# Patient Record
Sex: Female | Born: 1960 | Race: White | Hispanic: No | Marital: Married | Smoking: Former smoker
Health system: Southern US, Community
[De-identification: ages and names within clinical notes are randomized; demographics above are authoritative.]

## PROBLEM LIST (undated history)

## (undated) DIAGNOSIS — Z860101 Personal history of adenomatous and serrated colon polyps: Secondary | ICD-10-CM

## (undated) DIAGNOSIS — F32A Depression, unspecified: Secondary | ICD-10-CM

## (undated) DIAGNOSIS — J302 Other seasonal allergic rhinitis: Secondary | ICD-10-CM

## (undated) DIAGNOSIS — Z803 Family history of malignant neoplasm of breast: Secondary | ICD-10-CM

## (undated) DIAGNOSIS — J189 Pneumonia, unspecified organism: Secondary | ICD-10-CM

## (undated) DIAGNOSIS — G971 Other reaction to spinal and lumbar puncture: Secondary | ICD-10-CM

## (undated) DIAGNOSIS — Z9889 Other specified postprocedural states: Secondary | ICD-10-CM

## (undated) DIAGNOSIS — R112 Nausea with vomiting, unspecified: Secondary | ICD-10-CM

## (undated) DIAGNOSIS — I10 Essential (primary) hypertension: Secondary | ICD-10-CM

## (undated) DIAGNOSIS — R5382 Chronic fatigue, unspecified: Secondary | ICD-10-CM

## (undated) DIAGNOSIS — Z9289 Personal history of other medical treatment: Secondary | ICD-10-CM

## (undated) DIAGNOSIS — Z800 Family history of malignant neoplasm of digestive organs: Secondary | ICD-10-CM

## (undated) DIAGNOSIS — F319 Bipolar disorder, unspecified: Secondary | ICD-10-CM

## (undated) DIAGNOSIS — F411 Generalized anxiety disorder: Secondary | ICD-10-CM

## (undated) DIAGNOSIS — D126 Benign neoplasm of colon, unspecified: Secondary | ICD-10-CM

## (undated) DIAGNOSIS — F419 Anxiety disorder, unspecified: Secondary | ICD-10-CM

## (undated) DIAGNOSIS — F329 Major depressive disorder, single episode, unspecified: Secondary | ICD-10-CM

## (undated) DIAGNOSIS — K219 Gastro-esophageal reflux disease without esophagitis: Secondary | ICD-10-CM

## (undated) DIAGNOSIS — M1990 Unspecified osteoarthritis, unspecified site: Secondary | ICD-10-CM

## (undated) HISTORY — DX: Essential (primary) hypertension: I10

## (undated) HISTORY — DX: Major depressive disorder, single episode, unspecified: F32.9

## (undated) HISTORY — DX: Depression, unspecified: F32.A

## (undated) HISTORY — DX: Benign neoplasm of colon, unspecified: D12.6

## (undated) HISTORY — PX: HYSTERECTOMY ABDOMINAL WITH SALPINGECTOMY: SHX6725

## (undated) HISTORY — PX: HEMORRHOID BANDING: SHX5850

## (undated) HISTORY — DX: Chronic fatigue, unspecified: R53.82

## (undated) HISTORY — DX: Personal history of adenomatous and serrated colon polyps: Z86.0101

## (undated) HISTORY — DX: Family history of malignant neoplasm of digestive organs: Z80.0

## (undated) HISTORY — DX: Other seasonal allergic rhinitis: J30.2

## (undated) HISTORY — DX: Anxiety disorder, unspecified: F41.9

## (undated) HISTORY — DX: Gastro-esophageal reflux disease without esophagitis: K21.9

## (undated) HISTORY — DX: Generalized anxiety disorder: F41.1

## (undated) HISTORY — DX: Personal history of other medical treatment: Z92.89

## (undated) HISTORY — DX: Family history of malignant neoplasm of breast: Z80.3

---

## 1986-12-26 HISTORY — PX: APPENDECTOMY: SHX54

## 1994-12-26 DIAGNOSIS — D126 Benign neoplasm of colon, unspecified: Secondary | ICD-10-CM

## 1994-12-26 HISTORY — DX: Benign neoplasm of colon, unspecified: D12.6

## 1998-05-22 ENCOUNTER — Other Ambulatory Visit: Admission: RE | Admit: 1998-05-22 | Discharge: 1998-05-22 | Payer: Self-pay | Admitting: Gynecology

## 1999-07-26 ENCOUNTER — Other Ambulatory Visit: Admission: RE | Admit: 1999-07-26 | Discharge: 1999-07-26 | Payer: Self-pay | Admitting: Obstetrics & Gynecology

## 2000-01-20 ENCOUNTER — Inpatient Hospital Stay (HOSPITAL_COMMUNITY): Admission: AD | Admit: 2000-01-20 | Discharge: 2000-01-23 | Payer: Self-pay | Admitting: Obstetrics and Gynecology

## 2000-01-24 ENCOUNTER — Encounter: Admission: RE | Admit: 2000-01-24 | Discharge: 2000-04-23 | Payer: Self-pay | Admitting: Obstetrics and Gynecology

## 2000-04-25 ENCOUNTER — Encounter: Admission: RE | Admit: 2000-04-25 | Discharge: 2000-07-24 | Payer: Self-pay | Admitting: Obstetrics and Gynecology

## 2000-07-26 ENCOUNTER — Encounter: Admission: RE | Admit: 2000-07-26 | Discharge: 2000-08-23 | Payer: Self-pay | Admitting: Obstetrics and Gynecology

## 2000-08-18 ENCOUNTER — Other Ambulatory Visit: Admission: RE | Admit: 2000-08-18 | Discharge: 2000-08-18 | Payer: Self-pay | Admitting: Obstetrics & Gynecology

## 2001-02-22 ENCOUNTER — Other Ambulatory Visit: Admission: RE | Admit: 2001-02-22 | Discharge: 2001-02-22 | Payer: Self-pay | Admitting: Obstetrics & Gynecology

## 2001-02-22 ENCOUNTER — Encounter (INDEPENDENT_AMBULATORY_CARE_PROVIDER_SITE_OTHER): Payer: Self-pay

## 2001-12-26 DIAGNOSIS — Z9289 Personal history of other medical treatment: Secondary | ICD-10-CM

## 2001-12-26 HISTORY — DX: Personal history of other medical treatment: Z92.89

## 2002-01-01 ENCOUNTER — Other Ambulatory Visit: Admission: RE | Admit: 2002-01-01 | Discharge: 2002-01-01 | Payer: Self-pay | Admitting: Obstetrics & Gynecology

## 2002-10-01 ENCOUNTER — Observation Stay (HOSPITAL_COMMUNITY): Admission: RE | Admit: 2002-10-01 | Discharge: 2002-10-02 | Payer: Self-pay | Admitting: Obstetrics & Gynecology

## 2002-12-26 HISTORY — PX: ABDOMINAL HYSTERECTOMY: SHX81

## 2003-02-04 ENCOUNTER — Encounter: Payer: Self-pay | Admitting: Internal Medicine

## 2003-02-04 ENCOUNTER — Encounter: Admission: RE | Admit: 2003-02-04 | Discharge: 2003-02-04 | Payer: Self-pay | Admitting: Internal Medicine

## 2003-02-27 ENCOUNTER — Other Ambulatory Visit: Admission: RE | Admit: 2003-02-27 | Discharge: 2003-02-27 | Payer: Self-pay | Admitting: Gynecology

## 2003-03-20 ENCOUNTER — Encounter (INDEPENDENT_AMBULATORY_CARE_PROVIDER_SITE_OTHER): Payer: Self-pay | Admitting: Specialist

## 2003-03-20 ENCOUNTER — Inpatient Hospital Stay (HOSPITAL_COMMUNITY): Admission: RE | Admit: 2003-03-20 | Discharge: 2003-03-21 | Payer: Self-pay | Admitting: Gynecology

## 2003-11-11 ENCOUNTER — Encounter: Admission: RE | Admit: 2003-11-11 | Discharge: 2003-11-11 | Payer: Self-pay | Admitting: Endocrinology

## 2004-09-12 ENCOUNTER — Emergency Department (HOSPITAL_COMMUNITY): Admission: EM | Admit: 2004-09-12 | Discharge: 2004-09-13 | Payer: Self-pay | Admitting: Emergency Medicine

## 2005-01-06 ENCOUNTER — Other Ambulatory Visit: Admission: RE | Admit: 2005-01-06 | Discharge: 2005-01-06 | Payer: Self-pay | Admitting: Gynecology

## 2005-04-20 ENCOUNTER — Ambulatory Visit (HOSPITAL_BASED_OUTPATIENT_CLINIC_OR_DEPARTMENT_OTHER): Admission: RE | Admit: 2005-04-20 | Discharge: 2005-04-20 | Payer: Self-pay | Admitting: Urology

## 2005-04-20 ENCOUNTER — Ambulatory Visit (HOSPITAL_COMMUNITY): Admission: RE | Admit: 2005-04-20 | Discharge: 2005-04-20 | Payer: Self-pay | Admitting: Urology

## 2005-08-03 ENCOUNTER — Ambulatory Visit: Payer: Self-pay | Admitting: Internal Medicine

## 2005-08-05 ENCOUNTER — Ambulatory Visit: Payer: Self-pay | Admitting: Internal Medicine

## 2005-11-11 ENCOUNTER — Ambulatory Visit: Payer: Self-pay | Admitting: Internal Medicine

## 2005-11-15 ENCOUNTER — Ambulatory Visit: Payer: Self-pay | Admitting: Gastroenterology

## 2005-11-29 ENCOUNTER — Ambulatory Visit: Payer: Self-pay | Admitting: Gastroenterology

## 2006-03-07 ENCOUNTER — Ambulatory Visit: Payer: Self-pay | Admitting: Internal Medicine

## 2006-05-23 ENCOUNTER — Ambulatory Visit: Payer: Self-pay | Admitting: Internal Medicine

## 2006-07-06 ENCOUNTER — Ambulatory Visit: Payer: Self-pay | Admitting: Internal Medicine

## 2006-08-19 ENCOUNTER — Observation Stay (HOSPITAL_COMMUNITY): Admission: EM | Admit: 2006-08-19 | Discharge: 2006-08-20 | Payer: Self-pay | Admitting: Emergency Medicine

## 2006-09-08 ENCOUNTER — Ambulatory Visit: Payer: Self-pay | Admitting: Internal Medicine

## 2006-11-02 ENCOUNTER — Ambulatory Visit: Payer: Self-pay | Admitting: Internal Medicine

## 2006-12-26 HISTORY — PX: BREAST ENHANCEMENT SURGERY: SHX7

## 2006-12-26 HISTORY — PX: LUMBAR LAMINECTOMY: SHX95

## 2007-02-06 ENCOUNTER — Other Ambulatory Visit: Admission: RE | Admit: 2007-02-06 | Discharge: 2007-02-06 | Payer: Self-pay | Admitting: Gynecology

## 2007-02-21 ENCOUNTER — Ambulatory Visit: Payer: Self-pay | Admitting: Internal Medicine

## 2007-02-27 ENCOUNTER — Ambulatory Visit: Payer: Self-pay | Admitting: Internal Medicine

## 2007-09-11 ENCOUNTER — Ambulatory Visit: Payer: Self-pay | Admitting: Internal Medicine

## 2007-09-11 DIAGNOSIS — R5381 Other malaise: Secondary | ICD-10-CM

## 2007-09-11 DIAGNOSIS — R0609 Other forms of dyspnea: Secondary | ICD-10-CM

## 2007-09-11 DIAGNOSIS — R5383 Other fatigue: Secondary | ICD-10-CM

## 2007-09-11 DIAGNOSIS — M545 Low back pain: Secondary | ICD-10-CM

## 2007-09-11 DIAGNOSIS — R0989 Other specified symptoms and signs involving the circulatory and respiratory systems: Secondary | ICD-10-CM | POA: Insufficient documentation

## 2007-09-12 LAB — CONVERTED CEMR LAB
ALT: 17 units/L (ref 0–35)
AST: 19 units/L (ref 0–37)
Albumin: 4.1 g/dL (ref 3.5–5.2)
BUN: 11 mg/dL (ref 6–23)
Bilirubin, Direct: 0.1 mg/dL (ref 0.0–0.3)
CO2: 35 meq/L — ABNORMAL HIGH (ref 19–32)
Creatinine, Ser: 0.8 mg/dL (ref 0.4–1.2)
Free T4: 0.8 ng/dL (ref 0.6–1.6)
GFR calc non Af Amer: 83 mL/min
Monocytes Absolute: 0.6 10*3/uL (ref 0.2–0.7)
Neutro Abs: 3.9 10*3/uL (ref 1.4–7.7)
Neutrophils Relative %: 59 % (ref 43.0–77.0)
Platelets: 234 10*3/uL (ref 150–400)
Sodium: 140 meq/L (ref 135–145)
TSH: 1.59 microintl units/mL (ref 0.35–5.50)
Total Protein: 7 g/dL (ref 6.0–8.3)

## 2007-09-13 ENCOUNTER — Encounter (INDEPENDENT_AMBULATORY_CARE_PROVIDER_SITE_OTHER): Payer: Self-pay | Admitting: *Deleted

## 2007-09-24 ENCOUNTER — Ambulatory Visit: Payer: Self-pay | Admitting: Internal Medicine

## 2007-09-25 ENCOUNTER — Encounter (INDEPENDENT_AMBULATORY_CARE_PROVIDER_SITE_OTHER): Payer: Self-pay | Admitting: *Deleted

## 2007-09-28 ENCOUNTER — Encounter (INDEPENDENT_AMBULATORY_CARE_PROVIDER_SITE_OTHER): Payer: Self-pay | Admitting: *Deleted

## 2007-10-31 ENCOUNTER — Ambulatory Visit (HOSPITAL_COMMUNITY): Admission: RE | Admit: 2007-10-31 | Discharge: 2007-11-01 | Payer: Self-pay | Admitting: Specialist

## 2007-11-09 ENCOUNTER — Encounter (INDEPENDENT_AMBULATORY_CARE_PROVIDER_SITE_OTHER): Payer: Self-pay | Admitting: Dermatology

## 2007-12-07 ENCOUNTER — Encounter: Payer: Self-pay | Admitting: Internal Medicine

## 2007-12-27 HISTORY — PX: TONSILLECTOMY: SUR1361

## 2007-12-28 ENCOUNTER — Emergency Department (HOSPITAL_COMMUNITY): Admission: EM | Admit: 2007-12-28 | Discharge: 2007-12-29 | Payer: Self-pay | Admitting: Emergency Medicine

## 2008-07-15 DIAGNOSIS — F411 Generalized anxiety disorder: Secondary | ICD-10-CM

## 2008-07-15 HISTORY — DX: Generalized anxiety disorder: F41.1

## 2008-07-22 ENCOUNTER — Ambulatory Visit: Payer: Self-pay | Admitting: Internal Medicine

## 2008-07-22 ENCOUNTER — Encounter (INDEPENDENT_AMBULATORY_CARE_PROVIDER_SITE_OTHER): Payer: Self-pay | Admitting: *Deleted

## 2008-07-22 DIAGNOSIS — G43709 Chronic migraine without aura, not intractable, without status migrainosus: Secondary | ICD-10-CM | POA: Insufficient documentation

## 2008-07-22 DIAGNOSIS — D126 Benign neoplasm of colon, unspecified: Secondary | ICD-10-CM

## 2008-07-22 DIAGNOSIS — E049 Nontoxic goiter, unspecified: Secondary | ICD-10-CM | POA: Insufficient documentation

## 2008-07-28 ENCOUNTER — Encounter (INDEPENDENT_AMBULATORY_CARE_PROVIDER_SITE_OTHER): Payer: Self-pay | Admitting: *Deleted

## 2008-09-08 ENCOUNTER — Other Ambulatory Visit: Admission: RE | Admit: 2008-09-08 | Discharge: 2008-09-08 | Payer: Self-pay | Admitting: Gynecology

## 2008-09-25 ENCOUNTER — Emergency Department (HOSPITAL_COMMUNITY): Admission: EM | Admit: 2008-09-25 | Discharge: 2008-09-26 | Payer: Self-pay | Admitting: Emergency Medicine

## 2009-01-19 ENCOUNTER — Encounter: Payer: Self-pay | Admitting: Internal Medicine

## 2009-02-10 ENCOUNTER — Encounter (INDEPENDENT_AMBULATORY_CARE_PROVIDER_SITE_OTHER): Payer: Self-pay | Admitting: *Deleted

## 2009-04-01 ENCOUNTER — Encounter: Payer: Self-pay | Admitting: Internal Medicine

## 2009-04-24 ENCOUNTER — Encounter: Payer: Self-pay | Admitting: Internal Medicine

## 2009-09-17 ENCOUNTER — Telehealth (INDEPENDENT_AMBULATORY_CARE_PROVIDER_SITE_OTHER): Payer: Self-pay | Admitting: *Deleted

## 2009-09-22 ENCOUNTER — Ambulatory Visit: Payer: Self-pay | Admitting: Internal Medicine

## 2009-11-27 ENCOUNTER — Encounter (INDEPENDENT_AMBULATORY_CARE_PROVIDER_SITE_OTHER): Payer: Self-pay | Admitting: *Deleted

## 2010-02-25 ENCOUNTER — Telehealth (INDEPENDENT_AMBULATORY_CARE_PROVIDER_SITE_OTHER): Payer: Self-pay | Admitting: *Deleted

## 2010-03-01 ENCOUNTER — Encounter (INDEPENDENT_AMBULATORY_CARE_PROVIDER_SITE_OTHER): Payer: Self-pay | Admitting: *Deleted

## 2010-06-07 ENCOUNTER — Ambulatory Visit: Payer: Self-pay | Admitting: Internal Medicine

## 2010-06-07 DIAGNOSIS — R635 Abnormal weight gain: Secondary | ICD-10-CM

## 2010-06-08 ENCOUNTER — Telehealth (INDEPENDENT_AMBULATORY_CARE_PROVIDER_SITE_OTHER): Payer: Self-pay | Admitting: *Deleted

## 2010-06-11 LAB — CONVERTED CEMR LAB
ALT: 18 units/L (ref 0–35)
Albumin: 4.6 g/dL (ref 3.5–5.2)
Alkaline Phosphatase: 54 units/L (ref 39–117)
Basophils Relative: 0.7 % (ref 0.0–3.0)
CO2: 32 meq/L (ref 19–32)
Chloride: 102 meq/L (ref 96–112)
Creatinine, Ser: 1 mg/dL (ref 0.4–1.2)
Eosinophils Absolute: 0.1 10*3/uL (ref 0.0–0.7)
Eosinophils Relative: 1.8 % (ref 0.0–5.0)
Glucose, Bld: 72 mg/dL (ref 70–99)
Lymphs Abs: 1.6 10*3/uL (ref 0.7–4.0)
Neutro Abs: 1 10*3/uL — ABNORMAL LOW (ref 1.4–7.7)
Neutrophils Relative %: 33.3 % — ABNORMAL LOW (ref 43.0–77.0)
Platelets: 271 10*3/uL (ref 150.0–400.0)
Potassium: 4.4 meq/L (ref 3.5–5.1)
TSH: 0.92 microintl units/mL (ref 0.35–5.50)
Total Protein: 7.2 g/dL (ref 6.0–8.3)

## 2010-09-01 ENCOUNTER — Ambulatory Visit: Payer: Self-pay | Admitting: Internal Medicine

## 2010-09-01 DIAGNOSIS — I10 Essential (primary) hypertension: Secondary | ICD-10-CM

## 2010-09-01 DIAGNOSIS — J45909 Unspecified asthma, uncomplicated: Secondary | ICD-10-CM | POA: Insufficient documentation

## 2010-09-01 DIAGNOSIS — J309 Allergic rhinitis, unspecified: Secondary | ICD-10-CM | POA: Insufficient documentation

## 2010-12-07 ENCOUNTER — Encounter: Payer: Self-pay | Admitting: Gastroenterology

## 2011-01-12 ENCOUNTER — Encounter: Payer: Self-pay | Admitting: Internal Medicine

## 2011-01-12 ENCOUNTER — Ambulatory Visit
Admission: RE | Admit: 2011-01-12 | Discharge: 2011-01-12 | Payer: Self-pay | Source: Home / Self Care | Attending: Internal Medicine | Admitting: Internal Medicine

## 2011-01-12 ENCOUNTER — Other Ambulatory Visit: Payer: Self-pay | Admitting: Internal Medicine

## 2011-01-12 DIAGNOSIS — Z8679 Personal history of other diseases of the circulatory system: Secondary | ICD-10-CM | POA: Insufficient documentation

## 2011-01-12 DIAGNOSIS — Z9189 Other specified personal risk factors, not elsewhere classified: Secondary | ICD-10-CM | POA: Insufficient documentation

## 2011-01-13 LAB — CBC WITH DIFFERENTIAL/PLATELET
Basophils Absolute: 0 10*3/uL (ref 0.0–0.1)
Basophils Relative: 0.5 % (ref 0.0–3.0)
Eosinophils Absolute: 0.2 10*3/uL (ref 0.0–0.7)
Eosinophils Relative: 3.8 % (ref 0.0–5.0)
HCT: 41.5 % (ref 36.0–46.0)
Hemoglobin: 14.2 g/dL (ref 12.0–15.0)
Lymphocytes Relative: 52.8 % — ABNORMAL HIGH (ref 12.0–46.0)
Lymphs Abs: 2.2 10*3/uL (ref 0.7–4.0)
MCHC: 34.2 g/dL (ref 30.0–36.0)
MCV: 94.3 fl (ref 78.0–100.0)
Monocytes Absolute: 0.7 10*3/uL (ref 0.1–1.0)
Monocytes Relative: 15.9 % — ABNORMAL HIGH (ref 3.0–12.0)
Neutro Abs: 1.1 10*3/uL — ABNORMAL LOW (ref 1.4–7.7)
Neutrophils Relative %: 27 % — ABNORMAL LOW (ref 43.0–77.0)
Platelets: 237 10*3/uL (ref 150.0–400.0)
RBC: 4.4 Mil/uL (ref 3.87–5.11)
RDW: 12.6 % (ref 11.5–14.6)
WBC: 4.2 10*3/uL — ABNORMAL LOW (ref 4.5–10.5)

## 2011-01-13 LAB — T4, FREE: Free T4: 0.67 ng/dL (ref 0.60–1.60)

## 2011-01-13 LAB — T3, FREE: T3, Free: 1.7 pg/mL — ABNORMAL LOW (ref 2.3–4.2)

## 2011-01-13 LAB — MAGNESIUM: Magnesium: 1.9 mg/dL (ref 1.5–2.5)

## 2011-01-13 LAB — TSH: TSH: 1.3 u[IU]/mL (ref 0.35–5.50)

## 2011-01-17 ENCOUNTER — Telehealth (INDEPENDENT_AMBULATORY_CARE_PROVIDER_SITE_OTHER): Payer: Self-pay | Admitting: *Deleted

## 2011-01-17 DIAGNOSIS — D7289 Other specified disorders of white blood cells: Secondary | ICD-10-CM | POA: Insufficient documentation

## 2011-01-23 LAB — CONVERTED CEMR LAB
ALT: 10 units/L (ref 0–35)
AST: 14 units/L (ref 0–37)
Bilirubin, Direct: 0.1 mg/dL (ref 0.0–0.3)
Calcium: 9.4 mg/dL (ref 8.4–10.5)
Free T4: 0.8 ng/dL (ref 0.6–1.6)
GFR calc Af Amer: 87 mL/min
Glucose, Bld: 96 mg/dL (ref 70–99)
HDL: 76.1 mg/dL (ref >39.0)
Hemoglobin: 14.4 g/dL (ref 12.0–15.0)
MCHC: 34.7 g/dL (ref 30.0–36.0)
Monocytes Absolute: 0.5 10*3/uL (ref 0.1–1.0)
Monocytes Relative: 7.3 % (ref 3.0–12.0)
Potassium: 4.4 meq/L (ref 3.5–5.1)
RBC: 4.35 M/uL (ref 3.87–5.11)
RDW: 11.8 % (ref 11.5–14.6)
TSH: 0.98 microintl units/mL (ref 0.35–5.50)
Total Bilirubin: 0.7 mg/dL (ref 0.3–1.2)
Total Protein: 7.2 g/dL (ref 6.0–8.3)
VLDL: 10 mg/dL (ref 0–40)
WBC: 6.3 10*3/uL (ref 4.5–10.5)

## 2011-01-24 ENCOUNTER — Ambulatory Visit: Payer: Self-pay | Admitting: Oncology

## 2011-01-25 NOTE — Progress Notes (Signed)
Summary: Appointment Due  Phone Note Outgoing Call Call back at [facility] Phone [phone] Call back at Work Phone [phone]   Summary of Call: Enrique Sack please call this patient to schedule a CPX. Shonna Chock  February 25, 2010 4:00 PM     Additional Follow-up for Phone Call Additional follow up Details #2::    [facility]  mailed a letter Follow-up by: Barb Merino,  February 25, 2010 4:04 PM

## 2011-01-25 NOTE — Letter (Signed)
Summary: [facility]  [facility]   Imported By: Lanelle Bal 09/13/2010 11:00:03  _____________________________________________________________________  External Attachment:    Type:   Image     Comment:   External Document

## 2011-01-25 NOTE — Assessment & Plan Note (Signed)
Summary: 2 WEEK FOLLOWUP///SPH   Vital Signs:  Patient profile:   50 year old female Weight:      152 pounds Temp:     98.6 degrees F oral Pulse rate:   84 / minute Resp:     15 per minute BP sitting:   138 / 90  (left arm) Cuff size:   large  Vitals Entered By: Shonna Chock CMA (September 01, 2010 4:28 PM) CC: Follow-up visit: from last OV, patient also fatigue x several months and with cough, Cough   CC:  Follow-up visit: from last OV, patient also fatigue x several months and with cough, and Cough.  History of Present Illness: Cough      This is a 50 year old woman who presents with Cough as "tickle " in throat X several months.  The patient reports non-productive cough and  persistant malaise( see CBC & dif from June 2011 with viral pattern).She  denies pleuritic chest pain, shortness of breath but difficulty getting deep breath @ times, especially pre exercise. MDI helps "sometimes". She also  wheezing, exertional dyspnea, fever, and hemoptysis.  Associated symtpoms include  facial pressure  and chronic rhinitis w/o purulence.  The patient denies the following symptoms frontal headaches & acid reflux symptoms.  The cough is worse with lying down.  Ineffective prior treatments have included OTC cough medication and throat lozenges.  Risk factors include history of asthma  as a child  with recurrence last year.She is not on ACE-I.Her daughter & son  has EIB/asthma.  Current Medications (verified): 1)  Triamterene-Hctz 75-50 Mg Tabs (Triamterene-Hctz) .Marland Kitchen.. 1 By Mouth Once Daily 2)  Wellbutrin Xl 150 Mg Xr24h-Tab (Bupropion Hcl) .... Take 3 Tabs Once Daily 3)  Multivitamin 4)  Tramadol Hcl 50 Mg Tabs (Tramadol Hcl) .Marland Kitchen.. 1 Q 6 Hrs As Needed 5)  Lamictal 150 Mg Tabs (Lamotrigine) .... Take 2 Tab At Bedtime 6)  Metamucil .... As Directed 7)  Dulcolax Otc .... Take 1 Tab Two Times A Day 8)  Depakote Er 500 Mg Xr24h-Tab (Divalproex Sodium) .Marland Kitchen.. 1 By Mouth Once Daily  Allergies: 1)  !  Pcn 2)  ! Codeine 3)  ! Vicodin 4)  ! Morphine  Review of Systems Allergy:  Complains of itching eyes and sneezing; No angioedema.  Physical Exam  General:  well-nourished,in no acute distress; alert,appropriate and cooperative throughout examination Eyes:  No corneal or conjunctival inflammation noted. EOMI. Perrla. Ears:  External ear exam shows no significant lesions or deformities.  Otoscopic examination reveals clear canals, tympanic membranes are intact bilaterally without bulging, retraction, inflammation or discharge. Hearing is grossly normal bilaterally. Nose:  External nasal examination shows no deformity or inflammation. Nasal mucosa are pink and moist without lesions or exudates. Mouth:  Oral mucosa and oropharynx without lesions or exudates.  Teeth in good repair. Lungs:  Normal respiratory effort, chest expands symmetrically. Lungs are clear to auscultation, no crackles or wheezes. Heart:  Normal rate and regular rhythm. S1 and S2 normal without gallop, murmur, click, rub .S 4 Extremities:  No clubbing, cyanosis, edema. Cervical Nodes:  L anterior LN tender.   Axillary Nodes:  No palpable lymphadenopathy   Impression & Recommendations:  Problem # 1:  COUGH (ICD-786.2)  Problem # 2:  FATIGUE (ICD-780.79) Persistant with "viral " picture 05/2010  Problem # 3:  ASTHMA (ICD-493.90)  EIB component  Her updated medication list for this problem includes:    Singulair 10 Mg Tabs (Montelukast sodium) .Marland Kitchen... 1 once  daily    Dulera 100-5 Mcg/act Aero (Mometasone furo-formoterol fum) .Marland Kitchen... 1 -2 sprays two times a day as needed  Problem # 4:  RHINITIS (ICD-477.9)  Her updated medication list for this problem includes:    Nasonex 50 Mcg/act Susp (Mometasone furoate) .Marland Kitchen... 1 spray two times a day as directed  Problem # 5:  HYPERTENSION (ICD-401.9)  Her updated medication list for this problem includes:    Triamterene-hctz 75-50 Mg Tabs (Triamterene-hctz) .Marland Kitchen... 1 by mouth  once daily    Amlodipine Besylate 5 Mg Tabs (Amlodipine besylate) .Marland Kitchen... 1 once daily  Complete Medication List: 1)  Triamterene-hctz 75-50 Mg Tabs (Triamterene-hctz) .Marland Kitchen.. 1 by mouth once daily 2)  Wellbutrin Xl 150 Mg Xr24h-tab (Bupropion hcl) .... Take 3 tabs once daily 3)  Multivitamin  4)  Tramadol Hcl 50 Mg Tabs (Tramadol hcl) .Marland Kitchen.. 1 q 6 hrs as needed 5)  Lamictal 150 Mg Tabs (Lamotrigine) .... Take 2 tab at bedtime 6)  Metamucil  .... As directed 7)  Dulcolax Otc  .... Take 1 tab two times a day 8)  Depakote Er 500 Mg Xr24h-tab (Divalproex sodium) .Marland Kitchen.. 1 by mouth once daily 9)  Nasonex 50 Mcg/act Susp (Mometasone furoate) .Marland Kitchen.. 1 spray two times a day as directed 10)  Singulair 10 Mg Tabs (Montelukast sodium) .Marland Kitchen.. 1 once daily 11)  Dulera 100-5 Mcg/act Aero (Mometasone furo-formoterol fum) .Marland Kitchen.. 1 -2 sprays two times a day as needed 12)  Amlodipine Besylate 5 Mg Tabs (Amlodipine besylate) .Marland Kitchen.. 1 once daily  Patient Instructions: 1)  Neti pot once daily as needed for any nasal congestion. 2)  Sip room temperature  NON dairy fluid  as needed for cough. 3)  Check your Blood Pressure regularly.Your goal = AVERAGE < 135/85. Prescriptions: AMLODIPINE BESYLATE 5 MG TABS (AMLODIPINE BESYLATE) 1 once daily  #30 x 5   Entered and Authorized by:   Marga Melnick MD   Signed by:   Marga Melnick MD on 09/01/2010   Method used:   Print then Give to Patient   RxID:   [id number] DULERA 100-5 MCG/ACT AERO (MOMETASONE FURO-FORMOTEROL FUM) 1 -2 sprays two times a day as needed  #1 x 5   Entered and Authorized by:   Marga Melnick MD   Signed by:   Marga Melnick MD on 09/01/2010   Method used:   Print then Give to Patient   RxID:   [id number] SINGULAIR 10 MG TABS (MONTELUKAST SODIUM) 1 once daily  #30 x 5   Entered and Authorized by:   Marga Melnick MD   Signed by:   Marga Melnick MD on 09/01/2010   Method used:   Print then Give to Patient   RxID:   [id number] NASONEX 50  MCG/ACT SUSP (MOMETASONE FUROATE) 1 spray two times a day as directed  #1 x 0   Entered and Authorized by:   Marga Melnick MD   Signed by:   Marga Melnick MD on 09/01/2010   Method used:   Samples Given   RxID:   [id number]

## 2011-01-25 NOTE — Letter (Signed)
Summary: Primary Care Appointment Letter  [facility] [place]  [address] [place], [place] [address]   Phone: [phone]  Fax: [phone]    03/01/2010 MRN: [id number]  [facility] [address] [place], [place]  [address]  Dear Ms. Chervenak,   Your Primary Care Physician Marga Melnick MD has indicated that:    __x_____it is time to schedule an appointment. Please call and schedule a physical.    _______you missed your appointment on______ and need to call and          reschedule.    _______you need to have lab work done.    _______you need to schedule an appointment discuss lab or test results.    _______you need to call to reschedule your appointment that is                       scheduled on _________.     Please call our office as soon as possible. Our phone number is [phone]________. Please press option 1. Our office is open 8a-12noon and 1p-5p, Monday through Friday.     Thank you,     Primary Care Scheduler

## 2011-01-25 NOTE — Progress Notes (Signed)
Summary: Refill Request  Phone Note Refill Request   Refills Requested: Medication #1:  TRIAMTERENE-HCTZ 75-50 MG TABS TAKE ONE TABLET DAILY**OFFICE VISIT DUE ** patient was seen [id number] - dr hoper was to give her new rx - please fax to [facility]   Method Requested: Fax to Local Pharmacy Initial call taken by: Okey Regal [place],  June 08, 2010 12:09 PM Caller: Patient    New/Updated Medications: TRIAMTERENE-HCTZ 75-50 MG TABS (TRIAMTERENE-HCTZ) 1 by mouth once daily Prescriptions: TRIAMTERENE-HCTZ 75-50 MG TABS (TRIAMTERENE-HCTZ) 1 by mouth once daily  #30 x 5   Entered by:   Shonna Chock   Authorized by:   Marga Melnick MD   Signed by:   Shonna Chock on 06/08/2010   Method used:   Electronically to        [id number] (retail)       [address]       [place], [place]  [address]       Ph: [phone]       Fax: [phone]   RxID:   [id number]

## 2011-01-25 NOTE — Assessment & Plan Note (Signed)
Summary: sluggish//kn   Vital Signs:  Patient profile:   50 year old female Height:      64.5 inches Weight:      150.4 pounds BMI:     25.51 Temp:     98.1 degrees F oral Pulse rate:   84 / minute Resp:     15 per minute BP sitting:   116 / 70  (left arm) Cuff size:   large  Vitals Entered By: Shonna Chock (June 07, 2010 1:26 PM) CC: Sluggish x 6 months, Family HX: Thyroid concerns. Refill Triamterene-HTCZ, Fatigue Comments REVIEWED MED LIST, PATIENT AGREED DOSE AND INSTRUCTION CORRECT    CC:  Sluggish x 6 months, Family HX: Thyroid concerns. Refill Triamterene-HTCZ, and Fatigue.  History of Present Illness:  Fatigue      This is a 50 year old woman who presents with Fatigue, worse over 6 months.  The patient reports persistent fatigue, fatigue  even without physical activity and primarily physical fatigue, not motivational.  The patient also reports occasional night sweats and dyspnea (EIB).  The patient denies fever, weight loss, exertional chest pain, cough, and hemoptysis.  Other symptoms include daytime sleepiness and skin changes(excessive dryness).  The patient denies the following symptoms: leg swelling, orthopnea, PND, melena, adenopathy. Her husband describes  snoring w/o apnea , ? due to 10# gain since 08/2009.  Depressive symptoms include altered appetite.  The patient denies anhedonia, feeling depressed, and poor sleep.    Allergies: 1)  ! Pcn 2)  ! Codeine 3)  ! Vicodin 4)  ! Morphine  Review of Systems General:  Denies chills. Eyes:  Denies blurring, double vision, and vision loss-both eyes; Using bifocals. ENT:  Denies difficulty swallowing and hoarseness. CV:  Denies palpitations. Resp:  Complains of morning headaches. GI:  Denies constipation and diarrhea. MS:  Complains of low back pain; denies joint pain, joint redness, and joint swelling; S/P ESI by Dr Shelle Iron. Derm:  Denies changes in nail beds, hair loss, lesion(s), and rash; No tick exposure. Neuro:   Complains of numbness and tingling; N&T R shoulder . Psych:  Complains of irritability; denies anxiety, depression, easily angered, and easily tearful; Dr Tiajuana Amass has  Rxed Wellbutrin& Lamictil. She weaned herself off Depakote due to concerns about possible adverse effects.. Endo:  Complains of excessive thirst; denies cold intolerance, excessive hunger, excessive urination, and heat intolerance. Heme:  Denies abnormal bruising and bleeding.  Physical Exam  General:  Well-developed,well-nourished,in no acute distress; alert,appropriate and cooperative throughout examination Eyes:  No corneal or conjunctival inflammation noted.  Perrla. No lid lag. No icterus Neck:  No deformities, masses, or tenderness noted. Lungs:  Normal respiratory effort, chest expands symmetrically. Lungs are clear to auscultation, no crackles or wheezes. Heart:  normal rate, regular rhythm, no gallop, no rub, no JVD, no HJR, and grade 1/2-1  /6 systolic murmur LSB.   Abdomen:  Bowel sounds positive,abdomen soft and non-tender without masses, organomegaly or hernias noted. Pulses:  R and L carotid,radial,dorsalis pedis and posterior tibial pulses are full and equal bilaterally Extremities:  No clubbing, cyanosis, edema. Neurologic:  strength normal in all extremities and DTRs symmetrical and normal.   Skin:  Intact without suspicious lesions or rashes Cervical Nodes:  No lymphadenopathy noted Axillary Nodes:  No palpable lymphadenopathy Psych:  memory intact for recent and remote, flat affect, and subdued.     Impression & Recommendations:  Problem # 1:  FATIGUE (ICD-780.79)  Orders: TLB-BMP (Basic Metabolic Panel-BMET) (80048-METABOL) TLB-CBC Platelet -  w/Differential (85025-CBCD) TLB-Hepatic/Liver Function Pnl (80076-HEPATIC) TLB-TSH (Thyroid Stimulating Hormone) (84443-TSH) TLB-Sedimentation Rate (ESR) (85652-ESR)  Problem # 2:  WEIGHT GAIN (ICD-783.1)  Orders: TLB-TSH (Thyroid Stimulating  Hormone) (84443-TSH)  Problem # 3:  SNORING (ICD-786.09) w/o apnea Her updated medication list for this problem includes:    Triamterene-hctz 75-50 Mg Tabs (Triamterene-hctz) .Marland Kitchen... Take one tablet daily**office visit due **  Complete Medication List: 1)  Triamterene-hctz 75-50 Mg Tabs (Triamterene-hctz) .... Take one tablet daily**office visit due ** 2)  Wellbutrin Xl 150 Mg Xr24h-tab (Bupropion hcl) .... Take 3 tabs once daily 3)  Multivitamin  4)  Tramadol Hcl 50 Mg Tabs (Tramadol hcl) .Marland Kitchen.. 1 q 6 hrs as needed 5)  Lamictal 150 Mg Tabs (Lamotrigine) .... Take 2 tab at bedtime 6)  Metamucil  .... As directed 7)  Dulcolax Otc  .... Take 1 tab two times a day  Patient Instructions: 1)  Complete stool cards. Verify absence of apnea.

## 2011-01-26 ENCOUNTER — Telehealth: Payer: Self-pay | Admitting: Internal Medicine

## 2011-01-27 NOTE — Letter (Signed)
Summary: Colonoscopy Letter  [facility]  [address]   [place], [place] [address]   Phone: [phone]  Fax: [phone]      December 07, 2010 MRN: [id number]   [facility] [address] [place], [place]  [address]   Dear Ms. Applin,   According to your medical record, it is time for you to schedule a Colonoscopy. The American Cancer Society recommends this procedure as a method to detect early colon cancer. Patients with a family history of colon cancer, or a personal history of colon polyps or inflammatory bowel disease are at increased risk.  This letter has been generated based on the recommendations made at the time of your procedure. If you feel that in your particular situation this may no longer apply, please contact our office.  Please call our office at [phone] to schedule this appointment or to update your records at your earliest convenience.  Thank you for cooperating with [place] to provide you with the very best care possible.   Sincerely,   Claudette Head, M.D.  [facility] [phone]

## 2011-01-27 NOTE — Progress Notes (Signed)
Summary: Lab Results  Phone Note Outgoing Call Call back at Home Phone [phone]   Call placed by: Shonna Chock CMA,  January 17, 2011 3:34 PM Call placed to: Patient Summary of Call: Spoke with patient, patient ok with referral to [facility]  White blood count is higher( WBC was 3.1)   than in 06/11, but the elvated Lymphocyte count persists( Lymph. #  was 50.8 in 06/11).Neutrophil count (mature infection fighting cells)  have decreased from 33.3 to 27. In 2009 the WBC was 6.3 & Neutrophil # 67.1 & Lymphs 22.4.This may be medication related , please share these results with all MDs seen. I also recommend a Hematology consult with Dr Cyndie Chime. The low Free T3 is not significant as the TSH is normal. Please let me know if I may schedule the referral. Levester Fresh CMA  January 17, 2011 4:37 PM    New Problems: LYMPHOCYTOSIS (ICD-288.8)   New Problems: LYMPHOCYTOSIS (ICD-288.8)

## 2011-01-27 NOTE — Assessment & Plan Note (Signed)
Summary: elevated bp,elevated pulse/nta   Vital Signs:  Patient profile:   50 year old female Weight:      157.8 pounds BMI:     26.76 O2 Sat:      98 % on Room air Temp:     98.0 degrees F oral Pulse (ortho):   126 / minute Resp:     14 per minute BP standing:   112 / 90  Vitals Entered By: Shonna Chock CMA (January 12, 2011 2:15 PM)  O2 Flow:  Room air CC: Weight gain, fatigue, daily headaches, no energy, and dizzy, Fatigue   Serial Vital Signs/Assessments:  Time      Position  BP       Pulse  Resp  Temp     By 2:16 PM   Lying LA  124/94   106                   Chrae Malloy CMA 2:16 PM   Sitting   124/90   113                   Chrae Malloy CMA 2:16 PM   Standing  112/90   126                   Chrae Malloy CMA   CC:  Weight gain, fatigue, daily headaches, no energy, and dizzy, and Fatigue.  History of Present Illness:    Tammy Randolph  has been  having significant fatigue for > 12 months  in the context of  elevated BP & pulse  for approx 6 months. The patient reports persistent fatigue and fatigue even  without physical activity.  The patient also reports NP  cough @ night , ? from PNDrainage.  The patient denies fever, night sweats, weight loss, exertional chest pain, dyspnea, and hemoptysis.  Other symptoms include severe snoring and daytime sleepiness. No apnea documented by family. Her father had apnea. The patient denies the following symptoms: leg swelling, orthopnea, PND, melena, adenopathy,  hair, nail or  skin changes.       BP @ her  office  134/90 -165/115.  The patient reports lightheadedness and diffuse  headaches, but denies urinary frequency.  Associated symptoms include non exertional  palpitations.  The patient denies the following associated symptoms: syncope.  Adjunctive measures currently used by the patient include salt restriction.  No constellation of headache, flushing , chest pain & diarrhea.  Allergies: 1)  ! Pcn 2)  ! Codeine 3)  ! Vicodin 4)  !  Morphine  Review of Systems Eyes:  Complains of blurring; denies double vision and vision loss-both eyes. ENT:  Denies difficulty swallowing and hoarseness. GI:  Denies diarrhea. Derm:  Denies lesion(s) and rash. Neuro:  Denies brief paralysis, numbness, tingling, and weakness. Psych:  Complains of anxiety and depression; Her mother has been evaluated for bone cancer; her mother-in- lawhas CHF.Dr Milana Kidney is Rxing Lamictal. Endo:  Complains of heat intolerance; denies cold intolerance.  Physical Exam  General:  well-nourished,in no acute distress; alert,appropriate and cooperative throughout examination Head:  Normocephalic and atraumatic without obvious abnormalities. No apparent alopecia  Eyes:  No corneal or conjunctival inflammation noted.  Perrla. Funduscopic exam benign, without hemorrhages, exudates or papilledema. No lid lag Neck:  No deformities, masses, or tenderness noted. Lungs:  Normal respiratory effort, chest expands symmetrically. Lungs are clear to auscultation, no crackles or wheezes. Heart:  normal rate, regular rhythm, no gallop, no rub,  no JVD, no HJR, and grade 1 /6 systolic murmur.   Abdomen:  Bowel sounds positive,abdomen soft and non-tender without masses, organomegaly or hernias noted. Tatoo @ umbilicus . No AAA or bruits Pulses:  R and L carotid,radial,dorsalis pedis and posterior tibial pulses are full and equal bilaterally Extremities:  No clubbing, cyanosis, edema. Neurologic:  alert & oriented X3, strength normal in all extremities, and DTRs symmetrical and normal.   Skin:  Intact without suspicious lesions or rashes Cervical Nodes:  No lymphadenopathy noted Axillary Nodes:  No palpable lymphadenopathy Psych:  memory intact for recent and remote, normally interactive, good eye contact, not anxious appearing, and not depressed appearing.     Impression & Recommendations:  Problem # 1:  FATIGUE (ICD-780.79)  Orders: Venipuncture (14782) TLB-CBC  Platelet - w/Differential (85025-CBCD)  Problem # 2:  HYPERTENSION, LABILE (ICD-401.9)  The following medications were removed from the medication list:    Amlodipine Besylate 5 Mg Tabs (Amlodipine besylate) .Marland Kitchen... 1 once daily Her updated medication list for this problem includes:    Triamterene-hctz 75-50 Mg Tabs (Triamterene-hctz) .Marland Kitchen... 1 by mouth once daily    Verapamil Hcl 120 Mg Tabs (Verapamil hcl) .Marland Kitchen... 1 two times a day  Orders: Venipuncture (95621) TLB-CBC Platelet - w/Differential (85025-CBCD) EKG w/ Interpretation (93000)  Problem # 3:  TACHYCARDIA, HX OF (ICD-V12.50)  Orders: Venipuncture (30865) TLB-TSH (Thyroid Stimulating Hormone) (84443-TSH) TLB-Magnesium (Mg) (83735-MG) TLB-T4 (Thyrox), Free (84439-FT4R) TLB-T3, Free (Triiodothyronine) (84481-T3FREE) EKG w/ Interpretation (93000)  Problem # 4:  SNORING, HX OF (ICD-V15.89)  Complete Medication List: 1)  Triamterene-hctz 75-50 Mg Tabs (Triamterene-hctz) .Marland Kitchen.. 1 by mouth once daily 2)  Wellbutrin Xl 150 Mg Xr24h-tab (Bupropion hcl) .... Take 3 tabs once daily 3)  Multivitamin  4)  Lamictal 150 Mg Tabs (Lamotrigine) .... Take 2 tab at bedtime 5)  Metamucil  .... As directed 6)  Dulcolax Otc  .... Take 1 tab two times a day 7)  Singulair 10 Mg Tabs (Montelukast sodium) .Marland Kitchen.. 1 once daily 8)  Dulera 100-5 Mcg/act Aero (Mometasone furo-formoterol fum) .Marland Kitchen.. 1 -2 sprays two times a day as needed 9)  Celexa 20 Mg Tabs (Citalopram hydrobromide) .Marland Kitchen.. 1 by mouth once daily 10)  Verapamil Hcl 120 Mg Tabs (Verapamil hcl) .Marland Kitchen.. 1 two times a day  Patient Instructions: 1)  Holter monitor & Sleep evaluation may be needed if labs are normal/ negative. Avoid stimulants as discussed. Neti pot once daily - two times a day as needed  for congestion. Prescriptions: VERAPAMIL HCL 120 MG TABS (VERAPAMIL HCL) 1 two times a day  #60 x 5   Entered and Authorized by:   Marga Melnick MD   Signed by:   Marga Melnick MD on 01/12/2011    Method used:   Print then Give to Patient   RxID:   [id number]    Orders Added: 1)  Est. Patient Level IV [40102] 2)  Venipuncture [72536] 3)  TLB-CBC Platelet - w/Differential [85025-CBCD] 4)  TLB-TSH (Thyroid Stimulating Hormone) [84443-TSH] 5)  TLB-Magnesium (Mg) [83735-MG] 6)  TLB-T4 (Thyrox), Free [64403-KV4Q] 7)  TLB-T3, Free (Triiodothyronine) [59563-O7FIEP] 8)  EKG w/ Interpretation [93000]  Appended Document: elevated bp,elevated pulse/nta

## 2011-02-02 ENCOUNTER — Encounter: Payer: Self-pay | Admitting: Internal Medicine

## 2011-02-02 ENCOUNTER — Encounter (HOSPITAL_BASED_OUTPATIENT_CLINIC_OR_DEPARTMENT_OTHER): Payer: Self-pay | Admitting: Oncology

## 2011-02-02 ENCOUNTER — Other Ambulatory Visit: Payer: Self-pay | Admitting: Oncology

## 2011-02-02 DIAGNOSIS — D709 Neutropenia, unspecified: Secondary | ICD-10-CM

## 2011-02-02 LAB — COMPREHENSIVE METABOLIC PANEL
Albumin: 4.2 g/dL (ref 3.5–5.2)
Alkaline Phosphatase: 57 U/L (ref 39–117)
CO2: 30 mEq/L (ref 19–32)
Creatinine, Ser: 0.87 mg/dL (ref 0.40–1.20)
Glucose, Bld: 96 mg/dL (ref 70–99)
Potassium: 3.9 mEq/L (ref 3.5–5.3)
Total Bilirubin: 0.6 mg/dL (ref 0.3–1.2)
Total Protein: 7.2 g/dL (ref 6.0–8.3)

## 2011-02-02 LAB — MORPHOLOGY: PLT EST: ADEQUATE

## 2011-02-02 LAB — CBC & DIFF AND RETIC
HGB: 13.7 g/dL (ref 11.6–15.9)
MCV: 89.3 fL (ref 79.5–101.0)
MONO#: 0.5 10*3/uL (ref 0.1–0.9)
MONO%: 8.1 % (ref 0.0–14.0)
NEUT#: 3.9 10*3/uL (ref 1.5–6.5)
Platelets: 264 10*3/uL (ref 145–400)
RBC: 4.4 10*6/uL (ref 3.70–5.45)
Retic %: 1.69 % — ABNORMAL HIGH (ref 0.50–1.50)
Retic Ct Abs: 74.36 10*3/uL — ABNORMAL HIGH (ref 18.30–72.70)
lymph#: 2 10*3/uL (ref 0.9–3.3)
nRBC: 0 % (ref 0–0)

## 2011-02-02 NOTE — Progress Notes (Signed)
Summary: Dr Patsy Lager office never got paperwork  Phone Note Call from Patient Call back at Work Phone [phone]   Caller: Patient Summary of Call: Dr Patsy Lager office told patient that they did not receive paperwork sent on 1/30--please resend or call them to confirm that they received paperwork---patient needs to hear that paperwork was received------  please call her at [phone] Initial call taken by: Jerolyn Shin,  January 26, 2011 12:58 PM  Follow-up for Phone Call        REFERRAL & ALL INFO FAXED 2ND TIME TO [facility] OFFICE.  1ST FAX SHOWED TRANSMISSION WAS SUCCESSFUL. Magdalen Spatz [facility]  January 28, 2011 8:33 AM

## 2011-02-03 LAB — CMV IGM: CMV IgM: 0.31 (ref <0.90)

## 2011-02-03 LAB — CYTOMEGALOVIRUS ANTIBODY, IGG: Cytomegalovirus Ab-IgG: 0.23 (ref <0.90)

## 2011-02-03 LAB — ANA: Anti Nuclear Antibody(ANA): NEGATIVE

## 2011-02-16 ENCOUNTER — Encounter: Payer: Self-pay | Admitting: Internal Medicine

## 2011-02-16 ENCOUNTER — Encounter (HOSPITAL_BASED_OUTPATIENT_CLINIC_OR_DEPARTMENT_OTHER): Payer: BC Managed Care – PPO | Admitting: Oncology

## 2011-02-16 DIAGNOSIS — D709 Neutropenia, unspecified: Secondary | ICD-10-CM

## 2011-03-24 NOTE — Letter (Signed)
Summary: [facility]  [facility]   Imported By: Maryln Gottron 03/14/2011 13:42:06  _____________________________________________________________________  External Attachment:    Type:   Image     Comment:   External Document

## 2011-04-10 ENCOUNTER — Other Ambulatory Visit: Payer: Self-pay | Admitting: Internal Medicine

## 2011-05-10 NOTE — Op Note (Signed)
NAMEARIENNA, Tammy Randolph               ACCOUNT NO.:  [id number]   MEDICAL RECORD NO.:  [id number]          PATIENT TYPE:  AMB   LOCATION:  DAY                          FACILITY:  [facility]   PHYSICIAN:  Jene Every, M.D.    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   PROCEDURE PERFORMED:  Bilateral lateral recess decompression  foraminotomies of S1-2, microdiskectomy L5-S1 left.   ANESTHESIA:  General.   ASSISTANT:  Roma Schanz, P.Tammy.   BRIEF HISTORY:  The patient is Tammy 50 year old female with severe left  lower extremity radicular pain, occasional right lower extremity  radicular pain S1 nerve root distribution secondary to disk herniation  compressing the S1 nerve root, positive retention sign, diminished  plantar flexion, decreased sensation in the S1 dermatome.  MRI  indicating large disk herniation compressing the S1 nerve root in the  lateral recess centrally.  We indicated her for bilateral decompression.  This probably will require bilateral decompression and evaluation of the  lateral recesses.  Risks and benefits discussed including bleeding,  infection, damage to neurovascular structures, CSF leakage, epidural  fibrosis, degenerative segment disease, need for fusion in future,  anesthetic complications, etc.   TECHNIQUE:  The patient in supine position after induction of adequate  general anesthesia and 1 gram of Kefzol, she was placed prone on the  [place] frame.  All bony prominences were well-padded.  Lumbar region  was prepped and draped in the usual sterile fashion.  Tammy 18-gauge spinal  needle was utilized to localize the 5/1 interspace and confirmed with x-  ray.  Incision was made from  spinous process to 5/S1.  Subcutaneous  tissue was dissected.  Electrocautery was utilized to achieve  hemostasis.  Dorsolumbar fascia was identified and divided in line of  the skin incision.  Paraspinous muscle elevated from the lamina of 5/1  on the left.  Operating microscope draped and brought into the surgical  field after Penfield 4 placed in the interlaminar space, confirmed at  5/1 by x-ray.  The S1 nerve root was found to be erythematous and  edematous.  Ligamentum flavum detached from the cephalad edge of S1,  utilizing straight curette.  I then performed Tammy hemilaminotomy of S1  with foraminotomy of S1.  Hemilaminotomy of the caudad edge of 5 was  then performed, sparing the pars.  Ligamentum flavum then removed from  the interspace.  The S1 nerve root was then identified distally and  followed proximally.  It was displaced into the lateral recess,  compressing the lateral recess with the HNP within the axilla of the  nerve root.  Severe compression was noted into the lateral recess.  With  the neural patty, we delineated the soft tissue boundaries, clearly  identified the axilla of the S1 nerve root in the thecal sac as well as  the disk herniation.  There was some disk material that was extruding  out from the mass and the axilla.  I then performed annulotomy here and  removed copious portions of the disk material and free fragments with Tammy  micro pituitary with meticulous care not to apply tension on the S1  nerve root or the thecal sac.  I felt that opening the right side would  be appropriate in terms of evaluating the extent of the disk herniation  across the midline as well as to expose the right lateral recess.  In Tammy  similar fashion, we performed hemilaminotomies of the cephalad edge of  S1 and the caudad edge of 5, removing ligamentum flavum from the  interspace, detecting neural elements at all times.  Identified the S1  nerve root as well as the thecal sac here in the  lateral recess.  The  disk space was evaluated as well.  I placed Tammy hockey-stick beneath the  thecal sac in S1 nerve root here, displacing this material to the left.  This was then flat on the right side.  There was no evidence of  herniation extending to the right.  Hockey stick probe placed freely up  the foramen of 5 and S1 with good excursion of the S1 nerve root at  least Tammy centimeter medial to the pedicle without tension, returned to  the left.  I then retrieved additional fragment from the axillary  portion.  I then gently mobilize the S1 nerve root medially with the  thecal sac.  There was good excursion now of the S1 nerve root without  significant tension. I did find some residual disk herniation from  beneath the S1 nerve root.  I made an annulotomy out laterally and  removed Tammy few additional subannular fragments of disk with the micro  pituitary and the straight pituitary.  Hockey stick probe placed freely  out of the foramen of S1 and 5 with good excursion of the S1 nerve root  medial to the pedicle.  There was some osteophytic ridging across the  midline and across the 5 inferior endplate and S1 superior endplate due  to degenerative changes.  No further disk herniation was noted within  the disk space axilla of the root lateral aspect shoulder of root  foramen of 5 and S1 nor to the right side.  I copiously irrigated the  disk space with Tammy Angiocath and the interlaminar space.  Inspection  revealed no CSF leakage or active bleeding.  Tammy small piece of thrombin-  soaked Gelfoam bilaterally.  I removed the McCullough retractor,  irrigated copiously.  No evidence of active bleeding.  I then closed the  fascia with 0 Vicryl interrupted figure-of-eight sutures.  Subcutaneous  tissue reapproximated with 2-0 Vicryl simple sutures.  Skin was  reapproximated with 4-0 subcuticular Prolene.  Wound reinforced with  Steri-Strips.  Sterile dressing applied.  Placed supine on the hospital   bed, extubated without difficulty, transported to the recovery room in  satisfactory condition.   The patient tolerated the procedure well with no complications.    Minimal blood loss.      Jene Every, M.D.  Electronically Signed     JB/MEDQ  D:  10/31/2007  T:  11/01/2007  Job:  [id number]

## 2011-05-10 NOTE — Consult Note (Signed)
Tammy Randolph, Tammy Randolph               ACCOUNT NO.:  [id number]   MEDICAL RECORD NO.:  [id number]          PATIENT TYPE:  EMS   LOCATION:  MAJO                         FACILITY:  [facility]   PHYSICIAN:  Jefry H. Pollyann Kennedy, MD     DATE OF BIRTH:  10/21/1961   DATE OF CONSULTATION:  09/25/2008  DATE OF DISCHARGE:  09/26/2008                                 CONSULTATION   REASON FOR CONSULTATION:  Postoperative epistaxis.   HISTORY:  A 50 year old underwent tonsillectomy and nasal septoplasty 1  week ago and had significant bleeding from the right side earlier this  evening.  She is instructed to come to the emergency room if topical  Afrin did not stop it.  She done well prior to this episode.   PAST HISTORY:  Unremarkable.   PHYSICAL EXAMINATION:  Healthy-appearing lady.  There is active bleeding  and blood clot in the right nasal cavity.  This was evacuated with  suction.  Topical Afrin was applied on pledgets.  1% Xylocaine with  epinephrine was infiltrated into the septum and the inferior and middle  turbinates.  Bleeding site was not identified specifically, but a  Merocel pack was placed on the right side.  This was inflated with the  local anesthetic solution.  There was no further bleeding.  She  tolerated this well.   IMPRESSION:  Postoperative epistaxis.   PLAN:  Continue with packing for 4-5 days, have her follow up in the  office for packing removal early next week.  She will stay on  clindamycin until the packing comes out.      Jefry H. Pollyann Kennedy, MD  Electronically Signed     JHR/MEDQ  D:  10/02/2008  T:  10/02/2008  Job:  [id number]

## 2011-05-13 NOTE — Op Note (Signed)
Tammy Randolph, Tammy Randolph                         ACCOUNT NO.:  [id number]   MEDICAL RECORD NO.:  [id number]                   PATIENT TYPE:  INP   LOCATION:  0476                                 FACILITY:  [facility]   PHYSICIAN:  Howard C. Mezer, M.D.               DATE OF BIRTH:  23-Sep-1961   DATE OF PROCEDURE:  03/20/2003  DATE OF DISCHARGE:                                 OPERATIVE REPORT   PREOPERATIVE DIAGNOSES:  1. Dyspareunia.  2. Menometorrhagia.  3. Dysmenorrhea.   POSTOPERATIVE DIAGNOSES:  1. Dyspareunia.  2. Menometorrhagia.  3. Dysmenorrhea.  4. Adhesions.   OPERATION PERFORMED:  1. Total abdominal hysterectomy.  2. Right salpingo-oophorectomy.  3. Lysis of adhesions.   SURGEON:  [name]. Mezer, M.D.   ASSISTANT:  Harl Bowie, M.D.   ANESTHESIA:  General endotracheal.   PREPARATION:  Betadine.   DESCRIPTION OF PROCEDURE:  With the patient in the supine position and  prepped and draped per instructions from Dr. Stephens November for abdominoplasty,  a transverse incision was made through the previous abdominoplasty incision.  The incision was carried down through the subcutaneous tissue, and the  fascia and peritoneum were opened without difficulty.  A brief exploration  of her upper abdomen was benign.  Exploration of the pelvis revealed the  uterus to be approximately eight weeks in size.  The left ovary was normal.  The right ovary was very densely adherent to the posterior cornual area of  the uterus.  There were also adhesions of bowel in the left adnexal area,  extending from the ovary to the pelvic sidewall and infundibulopelvic  ligament.  These adhesions were taken down in layers using cautery.  The  round ligaments were suture ligated with #1 chromic suture and divided with  cautery.  The anterior leaf of the broad ligament was then opened and the  bladder easily taken down.  The pelvic sidewall on the right side was  opened, the ureter identified and  noted to be displaced somewhat anteriorly.  Great care was taken with respect to the ureter throughout the procedure.  The right infundibulopelvic ligament was isolated, clamped, cut, and free  tied with #1 chromic and then suture ligated with #1 chromic.  The utero-  ovarian ligament on the left was clamped and free tied with #1 chromic and  then suture ligated with #1 chromic.  The uterine arteries were then  clamped, cut, and suture ligated with #1 chromic.  There were adhesions of  bowel densely adherent to the cul-de-sac and up the area of the right  uterosacral ligament, extending to the level of the mid cervix.  These  adhesions were dense and vascular and were taken down very carefully in  layers.  There was a fair amount of oozing from these adhesions, and they  were arrested with gentle cautery and pressure.  Care was taken not to  injure the  bowel with the cautery.  The cardinal ligaments were taken in  several bites, clamped, cut, and suture ligated with #1 chromic.  The vagina  was entered laterally on the right side and the specimen excised with  circumferential dissection.  The angles were then closed with TeLinde-type  angled sutures of #1 chromic suture, and the cuff was then whipped  anteriorly and posteriorly with running locking #1 chromic suture.  Two  anterior-posterior sutures of #1 chromic were used to close the opening in  the vagina.  Hemostasis was assured in the bladder area, and the bladder was  placed over the vaginal cuff with a running 3-0 Vicryl suture.  There was  bleeding from the pedicle around the area of the uterine artery on the left  side, and a figure-of-eight and #1 chromic suture was placed to completely  secure hemostasis.  The tissue quality throughout the procedure was very  poor, and there was excess oozing.  This was unexpected given the patient's  general appearance and general medical condition.  At the completion of the  procedure with  hemostasis intact, both ureters were reinspected and found  not to be dilated and peristalsing bilaterally.  The large bowel was placed  in the cul-de-sac.  The omentum was brought down.  The peritoneum was closed  with a running 2-0 Vicryl suture, and the fascia was closed with a running 0  Vicryl to midline bilaterally.  At this point, Pleas Patricia, M.D.,  performed an abdominoplasty.  The estimated blood loss from the hysterectomy  was 250 mL.  The sponge, instrument, and needle counts were correct x2.                                               [name]. Mezer, M.D.    HCM/MEDQ  D:  03/20/2003  T:  03/21/2003  Job:  [id number]   cc:   Leona Singleton, M.D.  [address]  [place]  [place] [address]  Fax: [phone]

## 2011-05-13 NOTE — H&P (Signed)
Tammy Randolph, Tammy Randolph                         ACCOUNT NO.:  [id number]   MEDICAL RECORD NO.:  [id number]                   PATIENT TYPE:  INP   LOCATION:  0476                                 [facility]ACILITY:  [facility]   PHYSICIAN:  Howard C. Mezer, M.D.               DATE OF BIRTH:  August 15, 1961   DATE OF ADMISSION:  03/20/2003  DATE OF DISCHARGE:                                HISTORY & PHYSICAL   ADMISSION DIAGNOSES:  1. Pelvic pain.  2. Menometorrhagia.   HISTORY AND PHYSICAL:  The patient is a 50 year old gravida 3, para 3 female  status post tubal ligation who is admitted with a long history of  menometorrhagia, dyspareunia, and dysmenorrhea for a total abdominal  hysterectomy, question bilateral salpingo-oophorectomy.  The patient has  significant pain with intercourse and has decreased intercourse frequency  secondary to the pain.  She has pelvic pain and pressure and feeling like  her menstrual period is going to start every day.  She has had intermittent  postcoital bleeding and severe dysmenorrhea not controlled with non-  steroidal anti-inflammatory drugs.  The patient has been bleeding every two  to eight weeks.   On physical examination, the uterus appeared to be about 12 weeks size and  irregular with tender adnexa bilaterally.  Ultrasound examination revealed a  small fibroid, but was otherwise unremarkable.  Endometrial biopsy revealed  a 9+ cm cavity with moderate tissue, at that time there was a mass at the  cervix that was aggressed and most of the mass was in the uterus, and was  removed with traction and a torset.  This was remarkably well tolerated by  the patient and was clinically judged to be a fibroid.  It was exceptionally  firm, it was very round.  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  The patient says that her pain has persisted after  this was removed and wishes to proceed with total abdominal hysterectomy and  questionable bilateral salpingo-oophorectomy.  Total abdominal hysterectomy  and question of bilateral salpingo-oophorectomy have been discussed with the  patient in detail, and potential complications, including, but not limited  to anesthesia, injury to the bowel, bladder, ureter, possible fistula  formation, possible blood loss with transfusion and sequela, and possible  infection in the wound and pelvis have been discussed in detail.  The  patient wishes her ovaries to be removed only if there is significant  pathology or surgical indication.  The patient has reviewed the [facility] booklet  on hysterectomy.  The postoperative expectations and restrictions have been  reviewed in detail.  Permanent sterilization has been stressed.  Pain  control has been discussed.  The patient understands that there is no  guarantee that this procedure will alleviate her pain and dyspareunia,  but  that she will no longer have periods of irregular bleeding and dysmenorrhea.  The patient had been considering having an abdominoplasty performed, and at  the preoperative visit she said that she had visited with Dr. Marijean Niemann and although he thought that it would be indicated, could not work  out a schedule acceptable to the patient.  Upon arriving in the operating  room on the day of surgery with a surgery posted prior to this patient's  surgery, I was informed by the operating room staff that the patient was to  undergo an abdominoplasty by Dr. Pleas Patricia at the completion of the  hysterectomy.  I telephoned Dr. Stephens November who said that he had planned on  performing an abdominoplasty and that she had told him that she had come to  him because she was unhappy with part of the result from Dr. Dub Amis  previous surgery.  In the preoperative area it was discussed  with the  patient that there were potential increased complications with combined  procedures, and she was accepting of these risks.   PAST SURGICAL HISTORY:  1. Tubal ligation.  2. Posterior repair.  3. Hysteroscopy.  4. Appendectomy.  5. Diagnostic laparoscopy x2.  6. Breast augmentation.  7. Exploratory laparotomy for endometriosis.  8. Abdominoplasty.   PAST MEDICAL HISTORY:  1. Hypertension.  2. Thyroid nodule.   ALLERGIES:  PENICILLIN.   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   FAMILY HISTORY:  Noncontributory.   PHYSICAL EXAMINATION:  HEENT:  Negative.  LUNGS:  Clear.  HEART:  Without murmurs.  BREASTS:  Without masses or discharge with implants.  ABDOMEN:  Soft, nontender, without masses.  PELVIC:  EGBUS, vagina, and cervix to be normal.  The uterus is  approximately 12 weeks in size and irregular.  Tender at the right corneal  area.  The adnexa are without palpable masses.  EXTREMITIES:  Negative.   IMPRESSION:  1. Dyspareunia.  2. Menometorrhagia.  3. Dysmenorrhea.  4. Hypertension.  5. Thyroid nodule.   PLAN:  A total abdominal hysterectomy, question bilateral salpingo-  oophorectomy.  To be followed by a procedure by Dr. Pleas Patricia.                                               [name]. Mezer, M.D.    HCM/MEDQ  D:  03/20/2003  T:  03/21/2003  Job:  [id number]

## 2011-05-13 NOTE — Op Note (Signed)
Tammy Randolph, Tammy Randolph                         ACCOUNT NO.:  [id number]   MEDICAL RECORD NO.:  [id number]                   PATIENT TYPE:  INP   LOCATION:  NA                                   FACILITY:  [facility]   PHYSICIAN:  Ilda Mori, M.D.                DATE OF BIRTH:  12/21/1961   DATE OF PROCEDURE:  10/01/2002  DATE OF DISCHARGE:                                 OPERATIVE REPORT   PREOPERATIVE DIAGNOSES:  1. Fecal incontinence.  2. Old third degree obstetrical laceration.  3. Rectocele.  4. Voluntary sterilization.   POSTOPERATIVE DIAGNOSES:  1. Fecal incontinence.  2. Old third degree obstetrical laceration.  3. Rectocele.  4. Voluntary sterilization.   PROCEDURE:  1. Laparoscopic bilateral tubal cautery for sterilization.  2. Periniorrhaphy with repair of old third degree obstetrical injury.  3. Posterior repair.   SURGEON:  Ilda Mori, M.D.   ASSISTANT:  Luvenia Redden, MD   ANESTHESIA:  General endotracheal.   ESTIMATED BLOOD LOSS:  200 cc.   FINDINGS:  On laparoscopy the fallopian tubes were somewhat adherent to the  ovaries, but were free and the fimbriae were seen bilaterally.  On vaginal  surgery a second degree rectocele was noted as well as an old third degree  laceration was observed.   INDICATIONS:  This is a 50 year old gravida 3, para 3 who has noted  difficulty controlling stool and gas since her first delivery in 33.  The  patient has lived with this condition, but has been somewhat handicapped by  it.  Her last delivery was in 2001 and she desires no further pregnancies  and requests an attempt to create a more effective sphincter for better  bowel control.  In addition, the patient complains of decreased sensation  during intercourse secondary to a relaxed vaginal introitus and rectocele.  Finally, the patient requests permanent sterilization.   PROCEDURE:  The patient was taken to the operating room, placed in the  supine position  where general endotracheal anesthesia was induced.  She was  then placed in the modified dorsal lithotomy position and the abdomen,  perineum, and vagina were prepped and draped in a sterile fashion.  An  incision was made at the base of the umbilicus and the Veress needle was  introduced into the peritoneal cavity and pneumoperitoneum was created.  The  5 mm port was then placed through the umbilical incision and the 5 mm  laparoscope was introduced.  Under direct visualization an accessory port  was placed through a suprapubic stab wound.  The Kleppinger forceps was  introduced.  The left fallopian tube was identified, grasped at the isthmic  ampullary portion and cauterized along a 4 cm length until no current was  flowing through the tube.  Identical procedure was then carried out on the  contralateral tube.  At this point the gas was allowed to escape and the  incisions were sealed with Dermabond.  The patient was then placed in the  full dorsal lithotomy position.  The perineum was incised in a V shape with  the base of the V at the hymenal ring at the area that the vagina will be  reopposed.  The incision was then carried down through the posterior vaginal  mucosa until the rectocele was isolated.  The number 1 Vicryl suture was  then used to close the levator muscles across the rectocele defect.  Following this the perineum was dissected and the retracted anal sphincter  was grasped with Allis clamps and tied back across the midline anteriorly  with a figure-of-eight 0 Vicryl suture.  Further stitches were placed to  further bring the levator muscles across the rectocele and to support the  perineal body.  The excess vaginal tissue was then excised and the vagina  was reopposed in the midline with a running interlocking 3-0 Vicryl suture.  The peritoneum was then closed with a subcuticular 3-0 Vicryl suture.  Prior  to closing the perineum further sutures were placed in the  bulbocavernosus  muscles to support the perineal body at the vaginal introitus.  The  procedure was then terminated and the patient left the operating room in  good condition.                                               Ilda Mori, M.D.    RK/MEDQ  D:  10/01/2002  T:  10/01/2002  Job:  [id number]

## 2011-05-13 NOTE — Op Note (Signed)
[name], Randolph               ACCOUNT NO.:  [id number]   MEDICAL RECORD NO.:  [id number]          PATIENT TYPE:  AMB   LOCATION:  NESC                         FACILITY:  [facility]   PHYSICIAN:  Ronald L. Earlene Plater, M.D.  DATE OF BIRTH:  12/11/1961   DATE OF PROCEDURE:  04/20/2005  DATE OF DISCHARGE:                                 OPERATIVE REPORT   PREOPERATIVE DIAGNOSES:  Stress urinary incontinence.   POSTOPERATIVE DIAGNOSES:  Stress urinary incontinence.   OPERATION:  Placement of [employer] suprapubic sling and  cystourethroscopy.   SURGEON:  Lucrezia Starch. Earlene Plater, M.D.   ANESTHESIA:  LMA.   ESTIMATED BLOOD LOSS:  100 mL.   TUBES:  None.   PACKS:  One vaginal pack placed.   COMPLICATIONS:  None.   INDICATIONS FOR PROCEDURE:  [name] is a lovely 50 year old white female  whose very active physically and has had significant problems with  incontinence. She has tried multiple medications in the past and has really  not had much success with that. She underwent urodynamic evaluation and was  found to have a grade 1 cystocele and mild atrophic vaginitis. A Marshall  test was negative. 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 She has continued to have  problems with leaking with lifting and straining. She has some mild urgency  and frequency but the primary problem has been stress urinary incontinence.  She has tried Kegel exercises, Oxytrol patches, timed voiding, etc. and it  has really not helped significantly. After understanding the risks,  benefits, and alternatives, she has elected to proceed with placement of a  suprapubic sling.   DESCRIPTION OF PROCEDURE:  The patient was placed in a supine position,  proper LMA anesthesia and was placed in the dorsal lithotomy position,  prepped and draped with Betadine in a sterile fashion. A 16 French Foley  catheter was inserted, the bladder was drained and the  submucosa of the  vagina was injected with 10 mL of 1% Xylocaine with epinephrine at the  ureterovesical junction . An approximately 1 cm incision was made at the  urethrovesical junction, flaps were created bilaterally so that the  endopelvic fascia could be palpated under the pubis bilaterally. Punch holes  were then performed suprapubically one fingerbreadth superior to the pubic  symphysis and two fingerbreadths lateral to the midline, and utilizing the  suprapubic inserter needles, the needles were passed to finger pressure  through the endopelvic fascia bilaterally. Cystourethroscopy was then  performed with a 22.5 French Olympus panendoscope utilizing the 12 and 70  degree lenses. The bladder was fully distended, no perforation was noted.  The bladder was drained. The [facility] Scientific sling was then placed in  position. It was loosely approximated so that it completely deployed and was  laying in good position in the urethra. A hemostat could be inserted behind  it. The vaginal mucosa was then closed with a running locked 2-0 Vicryl  suture utilizing UR5 needle. Good hemostasis was noted to be present. A  vaginal pack was  placed after cystourethroscopy had been  performed and again the bladder was distended with 12 and 70 degree lenses  and there were no perforations noted. The tape was cut at skin level and the  skin was closed with Dermabond. A 2 inch vaginal pack was placed with  Bacitracin ointment. The patient was taken to the recovery room stable.      RLD/MEDQ  D:  04/20/2005  T:  04/20/2005  Job:  [id number]

## 2011-05-13 NOTE — Op Note (Signed)
[name], Tammy Randolph                         ACCOUNT NO.:  [id number]   MEDICAL RECORD NO.:  [id number]                   PATIENT TYPE:  INP   LOCATION:  0004                                 FACILITY:  [facility]   PHYSICIAN:  Consuello Bossier., M.D.         DATE OF BIRTH:  26-Nov-1961   DATE OF PROCEDURE:  03/20/2003  DATE OF DISCHARGE:                                 OPERATIVE REPORT   PREOPERATIVE DIAGNOSIS:  Abdominal elastosis following previous mini  abdominoplasty.   POSTOPERATIVE DIAGNOSIS:  Abdominal elastosis following previous mini  abdominoplasty.   OPERATION/PROCEDURE:  Completion of abdominoplasty.   SURGEON:  Pleas Patricia, M.D.   ANESTHESIA:  General endotracheal anesthesia.   FINDINGS:  The patient had had a previous mini abdominoplasty with a  generous lower abdominal incision.  She felt that her umbilicus was somewhat  of a lower position and wants fuller tightening procedure with efforts being  made to try to elevate the umbilicus to a slightly higher position.  The  above surgical procedure was carried out.   DESCRIPTION OF PROCEDURE:  Please see Dr. Corwin Levins operative note.  He had  utilized the previous transverse lower abdominal Pfannenstiel incision along  the central aspect and the fascia had been closed.  The incision was  continued out to include the previous lengthy lower abdominal transverse  incision.  Dissection was continued at the level of the fascia upward to the  umbilicus.  Then an incision was made around the circumference of the  umbilicus and the umbilicus freed of its attachments to the skin.  A  dissection was continued upward toward the xiphisternal area in the midline.  Bleeding was controlled with electrocautery and there was noted to be good  hemostasis.  There was a diastasis recti measuring 2-3 cm in the upper  abdominal area and interrupted 0 Prolene sutures were placed in figure-of-  eight fashion in the anterior fascia to  approximate those muscles closer to  the midline.  The wound was irrigated with normal saline and there was noted  to be good hemostasis.  The patient was placed in a jackknife position and  interrupted 2-0 Monocryl was used to approximate the incision on the  midline.  A Chevron shaped incision was made overlying the umbilicus more  correctly to a greater extent superior to this as was possible to bring the  umbilicus to a slightly higher position.  The umbilicus was sewn in place  along its circumference with interrupted 4-0 Monocryl.  The triangular  segments of full-thickness lower abdominal skin and subcutaneous tissues  which measured approximately 5.5 cm in height along its greatest aspects  medially were excised.  Bleeding again was controlled with electrocautery  and there was noted to be good hemostasis. Two 10 mm Blake drains had been  placed along the umbilicus and brought out through separate stab wounds  inferiorly.  The lower abdominal wounds were then closed on  either side with  interrupted subcutaneous 3-0 Monocryl followed by running subcuticular 4-0  Monocryl.  Steri-Strips, Xeroform, 4 x 8s, ABD, and a Hypafix dressing were  then applied.  The patient tolerated the procedure well and was able to be  discharged from the operating room to the recovery room in satisfactory  condition.                                              Consuello Bossier., M.D.     HH/MEDQ  D:  03/20/2003  T:  03/21/2003  Job:  [id number]

## 2011-05-13 NOTE — Op Note (Signed)
Tammy Randolph, Tammy Randolph               ACCOUNT NO.:  [id number]   MEDICAL RECORD NO.:  [id number]          PATIENT TYPE:  OBV   LOCATION:  5714                         FACILITY:  [facility]   PHYSICIAN:  Consuello Bossier., M.D.DATE OF BIRTH:  Apr 26, 1961   DATE OF PROCEDURE:  08/19/2006  DATE OF DISCHARGE:                                 OPERATIVE REPORT   PREOPERATIVE DIAGNOSIS:  Hematoma, left breast, nine days post previous  augmentation of mammoplasty.   POSTOPERATIVE DIAGNOSIS:  Hematoma, left breast, nine days post previous  augmentation of mammoplasty.   OPERATION:  Evacuation of hematoma, left breast.  Suture ligature of  bleeding vessel and replacement with same prosthesis.   SURGEON:  Consuello Bossier., M.D.   ANESTHESIA:  General endotracheal.   FINDINGS:  The patient is 9 days following her augmentation mammoplasty and  she had also had some flank liposuction when she noted the increasing size  of her left breast and pain in the left breast.  It appeared obvious that  she had a hematoma and it was felt necessary to return to the operating room  for evacuation of hematoma and achieving hemostasis.  At surgery, she was  found to have an actively bleeding vessel in the area of the lateral  capsulotomy, in the upper axillary area.  A suture ligature of this bleeding  vessel was performed.  The wound was irrigated with copious amounts of  saline.  All the blood was removed.  There was noted good hemostasis, and  the previous implant which was a 450 cc gel implant was replaced, and the  wound closed.   PROCEDURE:  The patient was brought to the operating room, given general  tracheal anesthetic, prepped with Betadine about both breasts and then  draped in sterile fashion.  The previously utilized inferior circumareolar  incision was incised, sutures removed, and what appeared to be some clotted  blood in the area above the muscle was encountered.  The implant was removed  and the clot was irrigated out and removed manually, and there was an active  bleeder in the axillary area which was clamped with a long tonsillar clamp,  and then a suture ligature of 2-0 Vicryl was placed and a further suture  ligature to try to ensure adequate hemostasis.  The wound was irrigated with  normal saline.  There was noted to be good hemostasis.  The wound was  inspected throughout again and there was no active bleeding.  The implant  which was a 450 cc gel implant which had been previously placed was put back  into position, and muscle closed, interrupted 2-0 Vicryl, and the breast  tissue and subcutaneous tissues were closed, interrupted 3-0 Monocryl,  followed by running subcuticular 4-0 Monocryl to the skin.  Steri-Strips,  Xeroform, 4 x 8s, ABD, and a circumthoracic Ace bandage, were applied.  The  patient tolerated the procedure well and was able to be discharged from the  operating room to the recovery room, subsequently to be admitted for  overnight observation.      Consuello Bossier., M.D.  Electronically  Signed    HH/MEDQ  D:  08/19/2006  T:  08/19/2006  Job:  [id number]

## 2011-05-13 NOTE — H&P (Signed)
NAMETRENDA, CORLISS               ACCOUNT NO.:  [id number]   MEDICAL RECORD NO.:  [id number]          PATIENT TYPE:  OBV   LOCATION:  2550                         FACILITY:  [facility]   PHYSICIAN:  Consuello Bossier., M.D.DATE OF BIRTH:  [birth year]   DATE OF ADMISSION:  08/19/2006  DATE OF DISCHARGE:                                HISTORY & PHYSICAL   HISTORY OF PRESENT ILLNESS:  This 50 year old female is admitted with a  history of having nine days ago undergoing outpatient surgery for  replacement of mammary implants as well as flank liposuction.  Over the last  several days, she has noticed what appeared to be a little more discomfort  on the left side but earlier this morning has noted that there was quite a  bit of additional swelling on the left breast as compared to the right.  She  was seen in the emergency room where she was noted to have what appeared to  be a hematoma surrounding the left mammary implant and it was felt necessary  to take her to the operating room for evacuation of the hematoma.   PAST MEDICAL HISTORY:  Revealed that she had endometriosis and some  hypertension but otherwise was doing well with no medical problems.   SOCIAL/ENVIRONMENTAL HISTORY:  She does not smoke or drink.   PHYSICAL EXAMINATION:  HEENT:  Negative.  PULMONARY:  Negative.  GI:  Negative.  GU:  Negative.  BREASTS:  There appeared to be quite a bit of enlargement of the left breast  as compared to the right.  It is quite tender and there is ice in place.  Her flanks appear to be doing well from recent liposuction.  No evidence of  any hematoma there or unusual swelling.   IMPRESSION:  Hematoma following implant replacement, left breast.   DISPOSITION:  The patient is admitted to go to the operating room to have  hematoma evacuated under general anesthesia.  I have explained to the  patient and her husband there were circumstances the fact that we will plan  to replace the same  implant if everything looks satisfactory. The  possibility of a replacement implant, the possibility of finding something  that would necessitate not putting an implant back in place.  Operative  permit signed.      Consuello Bossier., M.D.  Electronically Signed     HH/MEDQ  D:  08/19/2006  T:  08/19/2006  Job:  [id number]

## 2011-05-13 NOTE — Discharge Summary (Signed)
NAMEPARLEE, Tammy Randolph               ACCOUNT NO.:  [id number]   MEDICAL RECORD NO.:  [id number]          PATIENT TYPE:  OBV   LOCATION:  5714                         FACILITY:  [facility]   PHYSICIAN:  Consuello Bossier., M.D.DATE OF BIRTH:  Oct 18, 1961   DATE OF ADMISSION:  08/19/2006  DATE OF DISCHARGE:                                 DISCHARGE SUMMARY   DATE OF ADMISSION:  08/19/06   DATE OF DISCHARGE:  08/20/06   FINAL DIAGNOSIS:  Hematoma left wrist 9 days following previous implant  replacement, operation for evacuation of left breast hematoma after  achieving hemostasis, replacement with same prosthesis was performed on  08/19/2006.   HISTORY OF PRESENT ILLNESS:  This 50 year old female had an augmentation  mammoplasty 9 days previously and had noticed an enlarging left breast early  in the admission.  It was quite painful, it obviously was a hematoma.  It  was felt best served to return to the operating room for evacuation of the  hematoma and achieving hemostasis.   PAST MEDICAL HISTORY:  Essentially noncontributory.   REVIEW OF SYSTEMS:  Essentially noncontributory.   PHYSICAL EXAMINATION:  Pertinent physical examination shows a very large  left breast compared to the right.   LABORATORY:  Laboratory data was essentially within normal limits.   HOSPITAL COURSE:  Under general anesthesia on the morning of admission the  patient was taken to the operating room, the hematoma evacuated and there  was a remarkable arterial bleeder in the upper axillary area which was  treated by suture ligature and the same implant was used as was removed  prior to achieving hemostasis.  She tolerated the procedure well and did  well postoperatively.  She was given some antibiotic and will continue on  antibiotic, take Keflex 250 mg p.o. q.i.d. for 5 days. I will see her back  in the office for followup in 1 day.  They were advised to watch for any  problems and also gave her a prescription  for Mepergan Fortis to take as  needed for pain postoperatively, #60.      Consuello Bossier., M.D.  Electronically Signed     HH/MEDQ  D:  08/20/2006  T:  08/20/2006  Job:  [id number]

## 2011-06-15 ENCOUNTER — Other Ambulatory Visit: Payer: Self-pay | Admitting: Internal Medicine

## 2011-06-28 ENCOUNTER — Telehealth: Payer: Self-pay | Admitting: Internal Medicine

## 2011-06-28 NOTE — Telephone Encounter (Signed)
Pt called says she believes she has a sinus infection and it's causing HA and wanted to get atb and med for HA called to pharmacy she is out of town at [employer]. Informed pt that Dr. Alwyn Ren out of office and that per policy we don't call in atb w/out office visit and recommended that she try nasal saline rinse and otc meds to help with symptoms and find urgent care in the area pt ok'd information.

## 2011-09-06 ENCOUNTER — Encounter: Payer: Self-pay | Admitting: Internal Medicine

## 2011-09-06 ENCOUNTER — Ambulatory Visit (INDEPENDENT_AMBULATORY_CARE_PROVIDER_SITE_OTHER): Payer: BC Managed Care – PPO | Admitting: Internal Medicine

## 2011-09-06 DIAGNOSIS — D179 Benign lipomatous neoplasm, unspecified: Secondary | ICD-10-CM

## 2011-09-06 DIAGNOSIS — D7289 Other specified disorders of white blood cells: Secondary | ICD-10-CM

## 2011-09-06 LAB — CBC WITH DIFFERENTIAL/PLATELET
Eosinophils Absolute: 0.1 10*3/uL (ref 0.0–0.7)
Eosinophils Relative: 1.7 % (ref 0.0–5.0)
MCHC: 33.5 g/dL (ref 30.0–36.0)
MCV: 95.1 fl (ref 78.0–100.0)
Neutro Abs: 5.5 10*3/uL (ref 1.4–7.7)
Platelets: 344 10*3/uL (ref 150.0–400.0)
RBC: 4.44 Mil/uL (ref 3.87–5.11)

## 2011-09-06 NOTE — Patient Instructions (Signed)
Results of the CBC will be sent to you for your home reference file..Share results with  All MDs seen

## 2011-09-06 NOTE — Progress Notes (Signed)
  Subjective:    Patient ID: Tammy Randolph, female    DOB: 1961-03-02, 50 y.o.   MRN: [id number]  HPI   "Knots" Location : L thigh & R biceps; R biceps noted 1 week ago as she checked herself after thigh lesion noted Onset:L thigh noted 2 noted while showering Trigger/injury:no Pain, redness swelling:no Constitutional: Fever, chills, sweats, weight change:no, some loss with dietary changes Heme: Abnormal bruising or clotting, lymphadenopathy :some LA in neck; Dr Cyndie Chime ,[facility], has evaluated her. He recommended recheck of blood counts in 12/12. She describes easy bruising. Treatment/response:none   She is concerned because an uncle leukemia. Labs dated 1/18 and 2/8 were found in EMR. In January  lymphocyte or 52.8% and neutrophils 27%. On 2/8 neutrophils were normal at 58.5 and lymphocytes 29.7. Platelet counts have been normal serially.  She did not believe that she had labs done in February in followup.    Review of Systems     Objective:   Physical Exam  On exam she is healthy and well-nourished in appearance  She has no lymphadenopathy about the neck or axilla.  Thyroid is normal to palpation without nodularity  She has no organomegaly or masses.  Minor bruising is present over the right upper extremity  She has a subcutaneous pea-sized nodule in the left anterior thigh. This transilluminates  There is slight irregularity of the right biceps with suggestion of a small nodule which also transilluminates. Clinically it's unclear whether this is associated with the biceps tendon.        Assessment & Plan:  #1 subcutaneous nodules which transilluminate. The differential would be very tiny lipomas. The right biceps lesion could possibly be a small ganglion  #2 lymphocytosis, question status  #3 easy bruising  Plan: Labs were reviewed; CBC and differential will be repeated.

## 2011-09-14 LAB — URINALYSIS, ROUTINE W REFLEX MICROSCOPIC
Bilirubin Urine: NEGATIVE
Glucose, UA: NEGATIVE
Nitrite: POSITIVE — AB
Protein, ur: NEGATIVE
Specific Gravity, Urine: 1.026
Urobilinogen, UA: 1
pH: 7

## 2011-09-14 LAB — URINE MICROSCOPIC-ADD ON

## 2011-10-04 ENCOUNTER — Other Ambulatory Visit: Payer: Self-pay | Admitting: Internal Medicine

## 2011-10-05 LAB — URINALYSIS, ROUTINE W REFLEX MICROSCOPIC
Bilirubin Urine: NEGATIVE
Ketones, ur: NEGATIVE
Nitrite: NEGATIVE
Protein, ur: NEGATIVE
Urobilinogen, UA: 0.2

## 2011-10-05 LAB — COMPREHENSIVE METABOLIC PANEL
ALT: 15
Albumin: 3.8
Calcium: 9.6
Glucose, Bld: 88
Sodium: 137
Total Protein: 6.3

## 2011-10-05 LAB — CBC
Hemoglobin: 13.2
MCHC: 34.8
Platelets: 217
RDW: 12.1

## 2011-10-14 ENCOUNTER — Other Ambulatory Visit: Payer: Self-pay | Admitting: Internal Medicine

## 2011-12-27 HISTORY — PX: COLONOSCOPY W/ POLYPECTOMY: SHX1380

## 2012-01-10 ENCOUNTER — Other Ambulatory Visit: Payer: Self-pay | Admitting: Internal Medicine

## 2012-02-07 ENCOUNTER — Encounter: Payer: Self-pay | Admitting: Gastroenterology

## 2012-03-07 ENCOUNTER — Encounter: Payer: Self-pay | Admitting: Gastroenterology

## 2012-03-07 ENCOUNTER — Ambulatory Visit (AMBULATORY_SURGERY_CENTER): Payer: BC Managed Care – PPO | Admitting: *Deleted

## 2012-03-07 VITALS — Ht 65.0 in | Wt 155.8 lb

## 2012-03-07 DIAGNOSIS — Z86010 Personal history of colonic polyps: Secondary | ICD-10-CM

## 2012-03-07 DIAGNOSIS — Z1211 Encounter for screening for malignant neoplasm of colon: Secondary | ICD-10-CM

## 2012-03-07 MED ORDER — PEG-KCL-NACL-NASULF-NA ASC-C 100 G PO SOLR: ORAL | Status: DC

## 2012-03-16 ENCOUNTER — Other Ambulatory Visit: Payer: BC Managed Care – PPO | Admitting: Gastroenterology

## 2012-03-21 ENCOUNTER — Encounter: Payer: Self-pay | Admitting: Gastroenterology

## 2012-03-21 ENCOUNTER — Ambulatory Visit (AMBULATORY_SURGERY_CENTER): Payer: BC Managed Care – PPO | Admitting: Gastroenterology

## 2012-03-21 VITALS — BP 128/82 | HR 65 | Temp 98.0°F | Resp 22 | Ht 65.0 in | Wt 155.0 lb

## 2012-03-21 DIAGNOSIS — D126 Benign neoplasm of colon, unspecified: Secondary | ICD-10-CM

## 2012-03-21 DIAGNOSIS — Z1211 Encounter for screening for malignant neoplasm of colon: Secondary | ICD-10-CM

## 2012-03-21 DIAGNOSIS — Z86010 Personal history of colonic polyps: Secondary | ICD-10-CM

## 2012-03-21 MED ORDER — SODIUM CHLORIDE 0.9 % IV SOLN: 500.0000 mL | INTRAVENOUS | Status: DC

## 2012-03-21 NOTE — Progress Notes (Signed)
Patient did not experience any of the following events: a burn prior to discharge; a fall within the facility; wrong site/side/patient/procedure/implant event; or a hospital transfer or hospital admission upon discharge from the facility. (G8907) Patient did not have preoperative order for IV antibiotic SSI prophylaxis. (G8918)  

## 2012-03-21 NOTE — Patient Instructions (Signed)
YOU HAD AN ENDOSCOPIC PROCEDURE TODAY AT THE [facility]: Refer to the procedure report that was given to you for any specific questions about what was found during the examination.  If the procedure report does not answer your questions, please call your gastroenterologist to clarify.  If you requested that your care partner not be given the details of your procedure findings, then the procedure report has been included in a sealed envelope for you to review at your convenience later.  YOU SHOULD EXPECT: Some feelings of bloating in the abdomen. Passage of more gas than usual.  Walking can help get rid of the air that was put into your GI tract during the procedure and reduce the bloating. If you had a lower endoscopy (such as a colonoscopy or flexible sigmoidoscopy) you may notice spotting of blood in your stool or on the toilet paper. If you underwent a bowel prep for your procedure, then you may not have a normal bowel movement for a few days.  DIET: Your first meal following the procedure should be a light meal and then it is ok to progress to your normal diet.  A half-sandwich or bowl of soup is an example of a good first meal.  Heavy or fried foods are harder to digest and may make you feel nauseous or bloated.  Likewise meals heavy in dairy and vegetables can cause extra gas to form and this can also increase the bloating.  Drink plenty of fluids but you should avoid alcoholic beverages for 24 hours.  ACTIVITY: Your care partner should take you home directly after the procedure.  You should plan to take it easy, moving slowly for the rest of the day.  You can resume normal activity the day after the procedure however you should NOT DRIVE or use heavy machinery for 24 hours (because of the sedation medicines used during the test).    SYMPTOMS TO REPORT IMMEDIATELY: A gastroenterologist can be reached at any hour.  During normal business hours, 8:30 AM to 5:00 PM Monday through Friday,  call [phone].  After hours and on weekends, please call the GI answering service at [phone] who will take a message and have the physician on call contact you.   Following lower endoscopy (colonoscopy or flexible sigmoidoscopy):  Excessive amounts of blood in the stool  Significant tenderness or worsening of abdominal pains  Swelling of the abdomen that is new, acute  Fever of 100F or higher   FOLLOW UP: If any biopsies were taken you will be contacted by phone or by letter within the next 1-3 weeks.  Call your gastroenterologist if you have not heard about the biopsies in 3 weeks.  Our staff will call the home number listed on your records the next business day following your procedure to check on you and address any questions or concerns that you may have at that time regarding the information given to you following your procedure. This is a courtesy call and so if there is no answer at the home number and we have not heard from you through the emergency physician on call, we will assume that you have returned to your regular daily activities without incident.  SIGNATURES/CONFIDENTIALITY: You and/or your care partner have signed paperwork which will be entered into your electronic medical record.  These signatures attest to the fact that that the information above on your After Visit Summary has been reviewed and is understood.  Full responsibility of the confidentiality of   this discharge information lies with you and/or your care-partner.   INFORMATION ON POLYPS GIVEN TO YOU TODAY 

## 2012-03-21 NOTE — Op Note (Signed)
 [facility] [address] [place], [place]  [address]  COLONOSCOPY PROCEDURE REPORT  PATIENT:  Tammy Randolph, Tammy Randolph  MR#:  [id number] BIRTHDATE:  10/29/1961, 50 yrs. old  GENDER:  female ENDOSCOPIST:  Judie Petit T. Russella Dar, MD, [facility]  PROCEDURE DATE:  03/21/2012 PROCEDURE:  Colonoscopy with snare polypectomy ASA CLASS:  Class II INDICATIONS:  1) surveillance and high-risk screening  2) history of pre-cancerous (adenomatous) colon polyps: 1996  3) family history of colon cancer: brother age 36, MGM, P uncle MEDICATIONS:   MAC sedation, administered by CRNA, propofol (Diprivan) 250 mg IV DESCRIPTION OF PROCEDURE:   After the risks benefits and alternatives of the procedure were thoroughly explained, informed consent was obtained.  Digital rectal exam was performed and revealed no abnormalities.   The [id number] endoscope was introduced through the anus and advanced to the cecum, which was identified by both the appendix and ileocecal valve, without limitations.  The quality of the prep was adequate, using MoviPrep.  The instrument was then slowly withdrawn as the colon was fully examined. <<PROCEDUREIMAGES>> FINDINGS:  A sessile polyp was found in the proximal transverse colon. It was 5 mm in size. Polyp was snared without cautery. Retrieval was successful. Otherwise normal colonoscopy without other polyps, masses, vascular ectasias, or inflammatory changes. Retroflexed views in the rectum revealed no abnormalities.    The time to cecum =  2.25  minutes. The scope was then withdrawn (time =  9.33  min) from the patient and the procedure completed.  COMPLICATIONS:  None  ENDOSCOPIC IMPRESSION: 1) 5 mm sessile polyp in the proximal transverse colon  RECOMMENDATIONS: 1) Await pathology results 2) Repeat Colonoscopy in 5 years.  Venita Lick. Russella Dar, MD, [facility] [place]  n. eSIGNED:   Venita Lick. Tonea Leiphart at 03/21/2012 08:59 AM  Cherlyn Roberts, [id number]

## 2012-03-22 ENCOUNTER — Telehealth: Payer: Self-pay | Admitting: *Deleted

## 2012-03-22 NOTE — Telephone Encounter (Signed)
  Follow up Call-  Call back number 03/21/2012  Post procedure Call Back phone  # [phone]  Permission to leave phone message Yes     Patient questions:  Left message to call if necessary.

## 2012-03-27 ENCOUNTER — Encounter: Payer: Self-pay | Admitting: Gastroenterology

## 2012-06-19 ENCOUNTER — Telehealth: Payer: Self-pay | Admitting: Internal Medicine

## 2012-06-19 NOTE — Telephone Encounter (Signed)
Message copied by Marshell Garfinkel on Tue Jun 19, 2012  8:48 AM ------      Message from: Pecola Lawless      Created: Sun Jun 17, 2012 12:02 PM       Please ask her to schedule followup prior to next refill of medications. Please  schedule fasting Labs : BMET,Lipids,TSH, hepatic panel if these have not been done recently elsewhere. PLEASE BING THESE INSTRUCTIONS TO FOLLOW UP  LAB APPOINTMENT.This will guarantee correct labs are drawn, eliminating need for repeat blood sampling ( needle sticks ! ). Diagnoses /Codes: 401.9, 995.20 Please bring diary of    blood pressure readings

## 2012-06-19 NOTE — Telephone Encounter (Signed)
Lmovm for patient to call office. °

## 2012-07-25 NOTE — Telephone Encounter (Signed)
made lab appt 07/30/12 & follow up with dr hopper  08/06/12

## 2012-07-26 NOTE — Telephone Encounter (Signed)
Noted  

## 2012-07-30 ENCOUNTER — Other Ambulatory Visit (INDEPENDENT_AMBULATORY_CARE_PROVIDER_SITE_OTHER): Payer: BC Managed Care – PPO

## 2012-07-30 DIAGNOSIS — E785 Hyperlipidemia, unspecified: Secondary | ICD-10-CM

## 2012-07-30 LAB — BASIC METABOLIC PANEL
BUN: 12 mg/dL (ref 6–23)
CO2: 30 mEq/L (ref 19–32)
Calcium: 9.2 mg/dL (ref 8.4–10.5)
Glucose, Bld: 85 mg/dL (ref 70–99)
Potassium: 3.4 mEq/L — ABNORMAL LOW (ref 3.5–5.1)
Sodium: 139 mEq/L (ref 135–145)

## 2012-07-30 LAB — LIPID PANEL
HDL: 68.5 mg/dL (ref >39.00)
Total CHOL/HDL Ratio: 2
VLDL: 10.6 mg/dL (ref 0.0–40.0)

## 2012-07-30 LAB — HEPATIC FUNCTION PANEL: Albumin: 3.9 g/dL (ref 3.5–5.2)

## 2012-07-30 LAB — TSH: TSH: 1.18 u[IU]/mL (ref 0.35–5.50)

## 2012-08-06 ENCOUNTER — Ambulatory Visit (INDEPENDENT_AMBULATORY_CARE_PROVIDER_SITE_OTHER): Payer: BC Managed Care – PPO | Admitting: Internal Medicine

## 2012-08-06 ENCOUNTER — Encounter: Payer: Self-pay | Admitting: Internal Medicine

## 2012-08-06 VITALS — BP 124/80 | HR 94 | Wt 148.6 lb

## 2012-08-06 DIAGNOSIS — M542 Cervicalgia: Secondary | ICD-10-CM

## 2012-08-06 DIAGNOSIS — Z23 Encounter for immunization: Secondary | ICD-10-CM

## 2012-08-06 DIAGNOSIS — R5381 Other malaise: Secondary | ICD-10-CM

## 2012-08-06 DIAGNOSIS — E876 Hypokalemia: Secondary | ICD-10-CM

## 2012-08-06 DIAGNOSIS — R5383 Other fatigue: Secondary | ICD-10-CM

## 2012-08-06 LAB — CBC WITH DIFFERENTIAL/PLATELET
Basophils Relative: 0.8 % (ref 0.0–3.0)
Eosinophils Relative: 2.9 % (ref 0.0–5.0)
HCT: 42.1 % (ref 36.0–46.0)
Hemoglobin: 13.8 g/dL (ref 12.0–15.0)
Lymphs Abs: 1.8 10*3/uL (ref 0.7–4.0)
Monocytes Relative: 11.3 % (ref 3.0–12.0)
Neutro Abs: 3.3 10*3/uL (ref 1.4–7.7)
WBC: 6 10*3/uL (ref 4.5–10.5)

## 2012-08-06 NOTE — Progress Notes (Signed)
Subjective:    Patient ID: Tammy Randolph, female    DOB: 06/29/1961, 51 y.o.   MRN: [id number]  HPI She describes "super fatigue" for several years. This occurs at rest and has been only a physical not motivational fatigue. She has not had associated fever, chills, or change in weight. She does have sweats related to being perimenopausal. She does not have hoarseness or difficulty swallowing. Constipation was attributed to one of her medications, Intuitive, which was discontinued. She denies exertional chest pain but does have exercise-induced bronchospasm which responds to as needed albuterol. She denies cough, sputum production, or hemoptysis. She is not having paroxysmal nocturnal dyspnea. She has intermittent hand & ankle edema.  There is no melena or rectal bleeding. She has a history of snoring; there is no definite history of apnea.  She describes significant dryness of her skin but no other hair or nail changes. She has seen a hematologist for changes in her blood counts; she has abnormal bruising. She denies epistaxis or hematuria.  She has intolerance to heat only when having flashes. Significantly her  father and sister have apnea. She has a history of nontoxic nodular goiter.                                                                                Review of Systems She has had a steady, aching pain in the right posterior neck for 3 weeks. There was no trigger , injury or repetitive motion prior to the symptoms. Ice, heat and massage have not been of benefit. Certain position changes will cause a sharp pain to upper RUE . She has had some weakness in the right upper extremity, but no numbness or tingling. There's been no associated fever or rash. She also denies incontinence of urine or stool.  Extensive labs and was reviewed; all values were normal except for mildly reduced potassium of 3.4. She is on generic Maxzide 75/50 daily. Potassium will be rechecked; if it remains low;  recommend changing Maxide to spironolactone at the next refill            Objective:   Physical Exam Gen.: Healthy and well-nourished in appearance. Alert, appropriate and cooperative throughout exam. Head: Normocephalic without obvious abnormalities;  no alopecia  Eyes: No corneal or conjunctival inflammation noted.  Extraocular motion intact.  Nose: External nasal exam reveals no deformity or inflammation. Nasal mucosa are pink and moist. No lesions or exudates noted. Septum  Not deviated Mouth: Oral mucosa and oropharynx reveal no lesions or exudates. Teeth in good repair. Neck: No deformities, masses, or tenderness noted. Range of motion decreased. Thyroid normal. Lungs: Normal respiratory effort; chest expands symmetrically. Lungs are clear to auscultation without rales, wheezes, or increased work of breathing. Heart: Normal rate and rhythm. Normal S1 and S2. No gallop, click, or rub. S4 w/o murmur. Abdomen: Bowel sounds normal; abdomen soft and nontender. No masses, organomegaly or hernias noted.Aorta palpable ; no AAA  Musculoskeletal/extremities: No deformity or scoliosis noted of  the thoracic or lumbar spine. No clubbing, cyanosis, edema, or deformity noted. Range of motion slightly decreased right upper extremity with posterior rotation and elevation   .Tone & strength  normal.Joints normal. Nail health  good. Vascular: Carotid, radial artery, dorsalis pedis and  posterior tibial pulses are full and equal. No bruits present. Neurologic: Alert and oriented x3. Deep tendon reflexes symmetrical and normal. No cervical nerve deficit documented      Skin: Intact without suspicious lesions or rashes. Resolving ecchymosis dorsum of right hand Lymph: No cervical, axillary lymphadenopathy present. Psych: Mood and affect are normal. Normally interactive                                                                                          Assessment & Plan:  #1 fatigue, chronic #2 cervical pain without evidence of cervical radiculopathy #3 hypokalemia, ? from Maxzide #4 easy bruising Plan: Recheck CBC and differential and potassium. I'll ask her to review this note to share with Dr. Tomasa Rand. He should diuretic medication change to treat chronic fatigue. If apnea is suspect; official testing should be pursued

## 2012-08-06 NOTE — Patient Instructions (Addendum)
Review and correct the record as indicated. Please share record with all medical staff seen.  Please try to go on My Chart within the next 24 hours to allow me to release the results directly to you.

## 2012-08-10 ENCOUNTER — Telehealth: Payer: Self-pay

## 2012-08-10 MED ORDER — SPIRONOLACTONE 25 MG PO TABS: 25.0000 mg | ORAL_TABLET | Freq: Every day | ORAL | Status: DC

## 2012-08-10 NOTE — Telephone Encounter (Signed)
Message copied by Maurice Small on Fri Aug 10, 2012  7:58 AM ------      Message from: Pecola Lawless      Created: Thu Aug 09, 2012  6:16 PM       Please send a prescription for Spironolactone 25 mg daily in place of generic Maxzide 50/75; dispense 30

## 2012-09-11 ENCOUNTER — Other Ambulatory Visit: Payer: Self-pay | Admitting: Internal Medicine

## 2012-10-15 ENCOUNTER — Other Ambulatory Visit: Payer: Self-pay | Admitting: Internal Medicine

## 2012-12-31 ENCOUNTER — Emergency Department (HOSPITAL_BASED_OUTPATIENT_CLINIC_OR_DEPARTMENT_OTHER): Payer: BC Managed Care – PPO

## 2012-12-31 ENCOUNTER — Emergency Department (HOSPITAL_BASED_OUTPATIENT_CLINIC_OR_DEPARTMENT_OTHER)
Admission: EM | Admit: 2012-12-31 | Discharge: 2012-12-31 | Disposition: A | Discharge status: Home / Self Care | Payer: BC Managed Care – PPO | Attending: Emergency Medicine | Admitting: Emergency Medicine

## 2012-12-31 ENCOUNTER — Encounter (HOSPITAL_BASED_OUTPATIENT_CLINIC_OR_DEPARTMENT_OTHER): Payer: Self-pay

## 2012-12-31 DIAGNOSIS — X500XXA Overexertion from strenuous movement or load, initial encounter: Secondary | ICD-10-CM | POA: Insufficient documentation

## 2012-12-31 DIAGNOSIS — Z87891 Personal history of nicotine dependence: Secondary | ICD-10-CM | POA: Insufficient documentation

## 2012-12-31 DIAGNOSIS — S8990XA Unspecified injury of unspecified lower leg, initial encounter: Secondary | ICD-10-CM | POA: Insufficient documentation

## 2012-12-31 DIAGNOSIS — F3289 Other specified depressive episodes: Secondary | ICD-10-CM | POA: Insufficient documentation

## 2012-12-31 DIAGNOSIS — J45909 Unspecified asthma, uncomplicated: Secondary | ICD-10-CM | POA: Insufficient documentation

## 2012-12-31 DIAGNOSIS — F329 Major depressive disorder, single episode, unspecified: Secondary | ICD-10-CM | POA: Insufficient documentation

## 2012-12-31 DIAGNOSIS — Z79899 Other long term (current) drug therapy: Secondary | ICD-10-CM | POA: Insufficient documentation

## 2012-12-31 DIAGNOSIS — S8991XA Unspecified injury of right lower leg, initial encounter: Secondary | ICD-10-CM

## 2012-12-31 DIAGNOSIS — Y929 Unspecified place or not applicable: Secondary | ICD-10-CM | POA: Insufficient documentation

## 2012-12-31 DIAGNOSIS — Y9339 Activity, other involving climbing, rappelling and jumping off: Secondary | ICD-10-CM | POA: Insufficient documentation

## 2012-12-31 DIAGNOSIS — I10 Essential (primary) hypertension: Secondary | ICD-10-CM | POA: Insufficient documentation

## 2012-12-31 DIAGNOSIS — F411 Generalized anxiety disorder: Secondary | ICD-10-CM | POA: Insufficient documentation

## 2012-12-31 MED ORDER — OXYCODONE-ACETAMINOPHEN 5-325 MG PO TABS: 2.0000 | ORAL_TABLET | ORAL | Status: AC | PRN

## 2012-12-31 NOTE — ED Provider Notes (Signed)
History     CSN: [id number]  Arrival date & time 12/31/12  1115   First MD Initiated Contact with Patient 12/31/12 1214      Chief Complaint  Patient presents with  . Knee Injury    (Consider location/radiation/quality/duration/timing/severity/associated sxs/prior treatment) Patient is a 52 y.o. female presenting with knee pain. The history is provided by the patient. No language interpreter was used.  Knee Pain This is a new problem. The current episode started today. The problem occurs constantly. The problem has been gradually worsening. Associated symptoms include joint swelling. Nothing aggravates the symptoms. She has tried nothing for the symptoms. The treatment provided moderate relief.  Pt twisted knee to the side.  Pt reports knee was dislocated and she pushed back in   Past Medical History  Diagnosis Date  . Seasonal allergies   . Anxiety   . History of blood transfusion 2003  . Asthma   . Hypertension   . Depression   . Chronic fatigue     Past Surgical History  Procedure Date  . Appendectomy 1988  . Breast enhancement surgery 2008    bilateral  . Abdominal hysterectomy 2004  . Lumbar laminectomy 2008  . Tonsillectomy 2010  . Colonoscopy with polypectomy 2013    Dr Russella Dar    Family History  Problem Relation Age of Onset  . Colon cancer Brother 50  . Stomach cancer Maternal Uncle 67  . Colon cancer Paternal Uncle 68  . Colon cancer Maternal Grandmother 50  . Sleep apnea Father   . Sleep apnea Sister     History  Substance Use Topics  . Smoking status: Former Smoker    Quit date: 12/26/1986  . Smokeless tobacco: Never Used     Comment: up to 1& 1/2packs /week  . Alcohol Use: 2.4 oz/week    4 Glasses of wine per week    OB History    Grav Para Term Preterm Abortions TAB SAB Ect Mult Living                  Review of Systems  Musculoskeletal: Positive for joint swelling.  All other systems reviewed and are negative.    Allergies    Penicillins; Hydrocodone-acetaminophen; Codeine; and Morphine  Home Medications   Current Outpatient Rx  Name  Route  Sig  Dispense  Refill  . ALPRAZOLAM 1 MG PO TABS   Oral   Take 1 mg by mouth at bedtime as needed.         . BUPROPION HCL ER (XL) 300 MG PO TB24   Oral   Take 450 mg by mouth daily. Changed to 450 mg daily         . LAMOTRIGINE 150 MG PO TABS   Oral   Take 150 mg by mouth daily.         Marland Kitchen MONTELUKAST SODIUM 10 MG PO TABS      TAKE ONE TABLET BY MOUTH DAILY   30 tablet   5   . ONE-DAILY MULTI VITAMINS PO TABS   Oral   Take 1 tablet by mouth daily.         Marland Kitchen SPIRONOLACTONE 25 MG PO TABS      TAKE 1 TABLET BY MOUTH DAILY IN PLACE OF MAXZIDE   90 tablet   1   . SUMATRIPTAN SUCCINATE 50 MG PO TABS   Oral   Take 50 mg by mouth as needed.           Marland Kitchen  VERAPAMIL HCL 120 MG PO TABS      TAKE 1 TABLET BY MOUTH TWICE DAILY   60 tablet   5     BP 133/88  Pulse 98  Temp 98.2 F (36.8 C) (Oral)  Resp 20  Ht 5' 4.5" (1.638 m)  Wt 148 lb (67.132 kg)  BMI 25.01 kg/m2  SpO2 100%  Physical Exam  Nursing note and vitals reviewed. Constitutional: She is oriented to person, place, and time. She appears well-developed and well-nourished.  HENT:  Head: Normocephalic and atraumatic.  Eyes: Conjunctivae normal are normal. Pupils are equal, round, and reactive to light.  Musculoskeletal: She exhibits tenderness.       Tender posterior right knee and joint line,   Decreased range of motion,  nv and ns intact  Neurological: She is alert and oriented to person, place, and time. She has normal reflexes.  Skin: Skin is warm.  Psychiatric: She has a normal mood and affect.    ED Course  Procedures (including critical care time)  Labs Reviewed - No data to display No results found.   1. Injury of knee, right       MDM  Pt placed in a knee imbolizer and advised to follow up with her Orthopaedist at [facility].   Rx for  percocet.       Lonia Skinner [place], [place] 12/31/12 1239  Lonia Skinner [place], [place] 12/31/12 1245

## 2012-12-31 NOTE — ED Notes (Signed)
Injury to right knee that occurred after jumping.  States he knee was dislocated and she put in back in place.

## 2012-12-31 NOTE — ED Provider Notes (Signed)
Medical screening examination/treatment/procedure(s) were performed by non-physician practitioner and as supervising physician I was immediately available for consultation/collaboration.   Mahiya Kercheval, MD 12/31/12 1507 

## 2013-01-24 ENCOUNTER — Encounter (INDEPENDENT_AMBULATORY_CARE_PROVIDER_SITE_OTHER): Payer: BC Managed Care – PPO

## 2013-01-24 DIAGNOSIS — M7989 Other specified soft tissue disorders: Secondary | ICD-10-CM

## 2013-01-24 DIAGNOSIS — M79609 Pain in unspecified limb: Secondary | ICD-10-CM

## 2013-01-26 HISTORY — PX: KNEE ARTHROSCOPY WITH ANTERIOR CRUCIATE LIGAMENT (ACL) REPAIR: SHX5644

## 2013-06-01 ENCOUNTER — Encounter: Payer: Self-pay | Admitting: Internal Medicine

## 2013-06-04 ENCOUNTER — Encounter: Payer: Self-pay | Admitting: Internal Medicine

## 2013-06-04 ENCOUNTER — Ambulatory Visit (INDEPENDENT_AMBULATORY_CARE_PROVIDER_SITE_OTHER): Payer: BC Managed Care – PPO | Admitting: Internal Medicine

## 2013-06-04 VITALS — BP 140/90 | HR 112 | Wt 145.0 lb

## 2013-06-04 DIAGNOSIS — I10 Essential (primary) hypertension: Secondary | ICD-10-CM

## 2013-06-04 DIAGNOSIS — R0789 Other chest pain: Secondary | ICD-10-CM

## 2013-06-04 MED ORDER — SUMATRIPTAN SUCCINATE 50 MG PO TABS: 50.0000 mg | ORAL_TABLET | ORAL | Status: DC | PRN

## 2013-06-04 MED ORDER — LOSARTAN POTASSIUM 100 MG PO TABS: 100.0000 mg | ORAL_TABLET | Freq: Every day | ORAL | Status: DC

## 2013-06-04 NOTE — Progress Notes (Signed)
  Subjective:    Patient ID: Tammy Randolph, female    DOB: 1961/11/25, 52 y.o.   MRN: [id number]  HPI Despite a calcium channel blocker, verapamil 120 mg twice a day and spironolactone 25 mg daily; her blood pressure was found to be 160/110 and her gynecologist office 6/10. Her blood pressures been running at this level at home.  She has had some tachycardia as well as brief chest discomfort. She's had some pedal edema.  Additionally she's had headache across the eyes with radiation to the occipital area.  She describes both benign postural hypotension as well as benign positional vertigo symptoms intermittently.    Review of Systems  She is not having palpitations, dyspnea, or epistaxis.  She had been switched to spironolactone because of hypokalemia on HCTZ containing diuretic antihypertensive medication    Objective:   Physical Exam Appears healthy and well-nourished & in no acute distress  Fundal exam reveals normal vasculature without arteriolar narrowing or hemorrhage  No carotid bruits are present.No neck pain distention present at 10 - 15 degrees. Thyroid normal to palpation  Heart rhythm and rate are normal with no significant murmurs or gallops. S 4  Chest is clear with no increased work of breathing  Aorta is palpable without enlargement; no renal artery bruits  Abdomen soft with no organomegaly or masses. No HJR  No clubbing, cyanosis or edema present.  Pedal pulses are intact   No ischemic skin changes are present . Nails healthy    Alert and oriented. Strength, tone, DTRs reflexes normal          Assessment & Plan:  #1 HTN #2 chest pain See Orders and recommendations.  Note: After labs were drawn; she was to return for EKG. She misunderstood and left. Distress she should minimize her use of Imitrex and her ADD agent if blood pressure remains elevated.

## 2013-06-04 NOTE — Patient Instructions (Addendum)
Minimal Blood Pressure Goal= AVERAGE < 140/90;  Ideal is an AVERAGE < 135/85. This AVERAGE should be calculated from @ least 5-7 BP readings taken @ different times of day on different days of week. You should not respond to isolated BP readings , but rather the AVERAGE for that week .Please bring your  blood pressure cuff to office visits to verify that it is reliable.It  can also be checked against the blood pressure device at the pharmacy. Finger or wrist cuffs are not dependable; an arm cuff is.  Minimize taking your ADD & Imitrex medications if blood pressure is elevated.

## 2013-06-05 LAB — BASIC METABOLIC PANEL
CO2: 28 mEq/L (ref 19–32)
Chloride: 100 mEq/L (ref 96–112)
Glucose, Bld: 88 mg/dL (ref 70–99)
Potassium: 4.2 mEq/L (ref 3.5–5.1)
Sodium: 138 mEq/L (ref 135–145)

## 2013-06-10 ENCOUNTER — Encounter: Payer: Self-pay | Admitting: Internal Medicine

## 2013-06-17 ENCOUNTER — Other Ambulatory Visit: Payer: Self-pay | Admitting: Internal Medicine

## 2013-07-12 ENCOUNTER — Other Ambulatory Visit: Payer: Self-pay | Admitting: Internal Medicine

## 2013-09-16 ENCOUNTER — Encounter: Payer: Self-pay | Admitting: *Deleted

## 2013-09-17 ENCOUNTER — Encounter: Payer: Self-pay | Admitting: Internal Medicine

## 2013-09-17 ENCOUNTER — Telehealth: Payer: Self-pay | Admitting: Internal Medicine

## 2013-09-17 ENCOUNTER — Ambulatory Visit (INDEPENDENT_AMBULATORY_CARE_PROVIDER_SITE_OTHER): Payer: BC Managed Care – PPO | Admitting: Internal Medicine

## 2013-09-17 VITALS — BP 156/102 | HR 74 | Ht 65.0 in | Wt 150.0 lb

## 2013-09-17 DIAGNOSIS — G4710 Hypersomnia, unspecified: Secondary | ICD-10-CM

## 2013-09-17 DIAGNOSIS — I10 Essential (primary) hypertension: Secondary | ICD-10-CM

## 2013-09-17 DIAGNOSIS — R5381 Other malaise: Secondary | ICD-10-CM

## 2013-09-17 DIAGNOSIS — R0609 Other forms of dyspnea: Secondary | ICD-10-CM

## 2013-09-17 DIAGNOSIS — Z8679 Personal history of other diseases of the circulatory system: Secondary | ICD-10-CM

## 2013-09-17 DIAGNOSIS — R400 Somnolence: Secondary | ICD-10-CM

## 2013-09-17 DIAGNOSIS — M545 Low back pain: Secondary | ICD-10-CM

## 2013-09-17 DIAGNOSIS — R0683 Snoring: Secondary | ICD-10-CM

## 2013-09-17 MED ORDER — CARVEDILOL 6.25 MG PO TABS: ORAL_TABLET | ORAL | Status: DC

## 2013-09-17 NOTE — Assessment & Plan Note (Signed)
The patient has multiple symptoms today but palpitations and a sensation that her heart is racing are prominent. She feels palpitations at night when she lays down to go to bed. She has checked her pulse rate, and found to be in the mid 90s. We will plan to follow this up and if her symptoms do not improve with up titration of her beta blocker, we will plan for additional evaluation.

## 2013-09-17 NOTE — Patient Instructions (Addendum)
Your physician recommends that you schedule a follow-up appointment in: 8 weeks with Dr Ladona Ridgel  Your physician has recommended you make the following change in your medication:  1) STOP Verapamil 2) Start Carvedilol 6.25mg  ---take as directed Take 1/2 tablet twice daily for 1 week, then increase to 1 tablet twice daily for 1 month, then increase to 1 1/2 tablets twice daily  Your physician has recommended that you have a sleep study. This test records several body functions during sleep, including: brain activity, eye movement, oxygen and carbon dioxide blood levels, heart rate and rhythm, breathing rate and rhythm, the flow of air through your mouth and nose, snoring, body muscle movements, and chest and belly movement.

## 2013-09-17 NOTE — Telephone Encounter (Signed)
New problem   Tammy Randolph/[facility] need to see if pt has been taken off Zerapamil. Please advise

## 2013-09-17 NOTE — Assessment & Plan Note (Signed)
Her back pain is tolerable, but may be contributing to her high blood pressure.

## 2013-09-17 NOTE — Assessment & Plan Note (Signed)
The etiology of her symptoms is unclear. Because she has a history of snoring, and because she has early morning fatigue, I've recommended she undergo sleep study evaluation.

## 2013-09-17 NOTE — Assessment & Plan Note (Signed)
By her report, the blood pressure is not well controlled. I've recommended she switch from verapamil to low-dose carvedilol, and we will slowly titrate her medications over the next few months. In addition, she is instructed to maintain a low-sodium diet.

## 2013-09-17 NOTE — Telephone Encounter (Signed)
Spoke with Tammy Randolph and yes her Verapamil was stopped

## 2013-09-17 NOTE — Progress Notes (Signed)
HPI Tammy Randolph is referred today for evaluation of hypertension, palpitations, and the lack of energy. She is a very pleasant 52 year old woman whose health is been good. She has long-standing hypertension. Blood pressures tend to run in the 150/100 range, sometimes higher. She has been on medical therapy, and denies noncompliance. She also denies sodium indiscretion. The patient has been told that she snores. She has a problem with chronic low back pain as well as cervical spine pain. She has never had syncope. She has generalized fatigue, and in the early mornings feels tired when she wakes. Allergies  Allergen Reactions  . Penicillins     REACTION: respiratory & urticaria issues age 40  . Hydrocodone-Acetaminophen     REACTION: itching w/o rash  . Codeine     REACTION: makes her hyper; she can take Tramadol  . Morphine     REACTION: makes her hyper     Current Outpatient Prescriptions  Medication Sig Dispense Refill  . ALPRAZolam (XANAX) 1 MG tablet Take 1 mg by mouth at bedtime as needed.      Marland Kitchen amphetamine-dextroamphetamine (ADDERALL) 20 MG tablet Take 20 mg by mouth daily.      Marland Kitchen buPROPion (WELLBUTRIN XL) 150 MG 24 hr tablet 150 mg. Take 3 by mouth daily      . lamoTRIgine (LAMICTAL) 150 MG tablet Take 150 mg by mouth daily.      Marland Kitchen losartan (COZAAR) 100 MG tablet Take 1 tablet (100 mg total) by mouth daily.  30 tablet  2  . montelukast (SINGULAIR) 10 MG tablet TAKE ONE TABLET BY MOUTH DAILY  30 tablet  5  . Multiple Vitamin (MULTIVITAMIN) tablet Take 1 tablet by mouth daily.      Marland Kitchen oxyCODONE (OXY IR/ROXICODONE) 5 MG immediate release tablet       . spironolactone (ALDACTONE) 25 MG tablet TAKE 1 TABLET BY MOUTH ONCE DAILY IN PLACE OF MAXZIDE  90 tablet  1  . SUMAtriptan (IMITREX) 50 MG tablet Take 1 tablet (50 mg total) by mouth as needed.  10 tablet  0  . carvedilol (COREG) 6.25 MG tablet Take 1 1/2 tablets twice daily  270 tablet  3   No current facility-administered medications  for this visit.     Past Medical History  Diagnosis Date  . Seasonal allergies   . Anxiety   . History of blood transfusion 2003  . Asthma   . Hypertension   . Depression   . Chronic fatigue     ROS:   All systems reviewed and negative except as noted in the HPI.   Past Surgical History  Procedure Laterality Date  . Appendectomy  1988  . Breast enhancement surgery Bilateral 2008  . Abdominal hysterectomy  2004    and USO for endometriosis  . Lumbar laminectomy  2008  . Colonoscopy w/ polypectomy  2013    Dr Russella Dar  . Tubal ligation      x4 for endometriosis     Family History  Problem Relation Age of Onset  . Colon cancer Brother 50  . Stomach cancer Maternal Uncle 67  . Colon cancer Paternal Uncle 32  . Colon cancer Maternal Grandmother 50  . Sleep apnea Father   . Sleep apnea Sister   . CVA Father   . Coronary artery disease Paternal Aunt   . Pancreatic cancer Paternal Uncle   . CVA Mother     x3  . Hypertension Mother   . Arthritis Mother   .  Breast cancer Mother   . Heart Problems Mother     pacemaker  . Heart attack Maternal Uncle   . Clotting disorder Brother   . Hypertension Sister   . Hypertension Sister      History   Social History  . Marital Status: Married    Spouse Name: N/A    Number of Children: N/A  . Years of Education: N/A   Occupational History  . Not on file.   Social History Main Topics  . Smoking status: Former Smoker    Quit date: 12/26/1986  . Smokeless tobacco: Never Used     Comment: up to 1& 1/2packs /week  . Alcohol Use: 2.4 oz/week    4 Glasses of wine per week  . Drug Use: No  . Sexual Activity: Not on file   Other Topics Concern  . Not on file   Social History Narrative  . No narrative on file     BP 156/102  Pulse 74  Ht 5\' 5"  (1.651 m)  Wt 150 lb (68.04 kg)  BMI 24.96 kg/m2  Physical Exam:  Well appearing 52 year old woman, NAD HEENT: Unremarkable Neck:  No JVD, no thyromegally Back:  No  CVA tenderness Lungs:  Clear with no wheezes, rales, or rhonchi. HEART:  Regular rate rhythm, no murmurs, no rubs, no clicks Abd:  soft, positive bowel sounds, no organomegally, no rebound, no guarding Ext:  2 plus pulses, no edema, no cyanosis, no clubbing Skin:  No rashes no nodules Neuro:  CN II through XII intact, motor grossly intact  EKG - normal sinus rhythm with normal axis and intervals   Assess/Plan:

## 2013-10-10 ENCOUNTER — Ambulatory Visit: Payer: BC Managed Care – PPO | Admitting: Internal Medicine

## 2013-10-21 ENCOUNTER — Encounter (HOSPITAL_BASED_OUTPATIENT_CLINIC_OR_DEPARTMENT_OTHER): Payer: BC Managed Care – PPO

## 2013-11-02 ENCOUNTER — Other Ambulatory Visit: Payer: Self-pay | Admitting: Internal Medicine

## 2013-11-04 NOTE — Telephone Encounter (Signed)
Losartan refill sent to pharmacy 

## 2013-11-25 ENCOUNTER — Ambulatory Visit: Payer: BC Managed Care – PPO | Admitting: Internal Medicine

## 2013-12-31 ENCOUNTER — Ambulatory Visit: Payer: BC Managed Care – PPO | Admitting: Internal Medicine

## 2014-01-10 ENCOUNTER — Encounter: Payer: Self-pay | Admitting: Internal Medicine

## 2014-03-14 ENCOUNTER — Other Ambulatory Visit: Payer: Self-pay | Admitting: Internal Medicine

## 2014-05-23 DIAGNOSIS — F900 Attention-deficit hyperactivity disorder, predominantly inattentive type: Secondary | ICD-10-CM | POA: Insufficient documentation

## 2014-05-23 DIAGNOSIS — F3181 Bipolar II disorder: Secondary | ICD-10-CM | POA: Insufficient documentation

## 2014-07-26 LAB — HM MAMMOGRAPHY

## 2014-10-21 ENCOUNTER — Other Ambulatory Visit: Payer: Self-pay | Admitting: Plastic Surgery

## 2014-11-25 ENCOUNTER — Other Ambulatory Visit: Payer: Self-pay | Admitting: Internal Medicine

## 2014-11-28 ENCOUNTER — Other Ambulatory Visit: Payer: Self-pay | Admitting: Internal Medicine

## 2014-11-29 NOTE — Telephone Encounter (Signed)
Rx was sent to pharmacy electronically. 

## 2014-12-08 ENCOUNTER — Other Ambulatory Visit (HOSPITAL_COMMUNITY): Payer: Self-pay | Admitting: Plastic Surgery

## 2014-12-08 DIAGNOSIS — IMO0002 Reserved for concepts with insufficient information to code with codable children: Secondary | ICD-10-CM

## 2014-12-09 ENCOUNTER — Other Ambulatory Visit: Payer: Self-pay | Admitting: Radiology

## 2014-12-11 ENCOUNTER — Other Ambulatory Visit (HOSPITAL_COMMUNITY): Payer: Self-pay | Admitting: Plastic Surgery

## 2014-12-11 ENCOUNTER — Encounter (HOSPITAL_COMMUNITY): Payer: Self-pay

## 2014-12-11 ENCOUNTER — Ambulatory Visit (HOSPITAL_COMMUNITY)
Admission: RE | Admit: 2014-12-11 | Discharge: 2014-12-11 | Disposition: A | Discharge status: Home / Self Care | Payer: BC Managed Care – PPO | Source: Ambulatory Visit | Attending: Plastic Surgery | Admitting: Plastic Surgery

## 2014-12-11 DIAGNOSIS — T792XXA Traumatic secondary and recurrent hemorrhage and seroma, initial encounter: Secondary | ICD-10-CM | POA: Insufficient documentation

## 2014-12-11 DIAGNOSIS — F329 Major depressive disorder, single episode, unspecified: Secondary | ICD-10-CM | POA: Insufficient documentation

## 2014-12-11 DIAGNOSIS — Z79899 Other long term (current) drug therapy: Secondary | ICD-10-CM | POA: Insufficient documentation

## 2014-12-11 DIAGNOSIS — F419 Anxiety disorder, unspecified: Secondary | ICD-10-CM | POA: Diagnosis not present

## 2014-12-11 DIAGNOSIS — Z9882 Breast implant status: Secondary | ICD-10-CM | POA: Insufficient documentation

## 2014-12-11 DIAGNOSIS — IMO0002 Reserved for concepts with insufficient information to code with codable children: Secondary | ICD-10-CM

## 2014-12-11 DIAGNOSIS — Z87891 Personal history of nicotine dependence: Secondary | ICD-10-CM | POA: Diagnosis not present

## 2014-12-11 DIAGNOSIS — I10 Essential (primary) hypertension: Secondary | ICD-10-CM | POA: Diagnosis not present

## 2014-12-11 DIAGNOSIS — J45909 Unspecified asthma, uncomplicated: Secondary | ICD-10-CM | POA: Insufficient documentation

## 2014-12-11 DIAGNOSIS — T888XXA Other specified complications of surgical and medical care, not elsewhere classified, initial encounter: Secondary | ICD-10-CM | POA: Insufficient documentation

## 2014-12-11 LAB — APTT: APTT: 32 s (ref 24–37)

## 2014-12-11 LAB — CBC
HCT: 42.2 % (ref 36.0–46.0)
Hemoglobin: 14.2 g/dL (ref 12.0–15.0)
MCH: 29.8 pg (ref 26.0–34.0)
MCHC: 33.6 g/dL (ref 30.0–36.0)
MCV: 88.5 fL (ref 78.0–100.0)
PLATELETS: 247 10*3/uL (ref 150–400)
RBC: 4.77 MIL/uL (ref 3.87–5.11)
RDW: 12.9 % (ref 11.5–15.5)
WBC: 6.9 10*3/uL (ref 4.0–10.5)

## 2014-12-11 LAB — PROTIME-INR
INR: 1 (ref 0.00–1.49)
PROTHROMBIN TIME: 13.3 s (ref 11.6–15.2)

## 2014-12-11 MED ORDER — MIDAZOLAM HCL 2 MG/2ML IJ SOLN
INTRAMUSCULAR | Status: AC
  Filled 2014-12-11: qty 2

## 2014-12-11 MED ORDER — LIDOCAINE HCL (PF) 1 % IJ SOLN
INTRAMUSCULAR | Status: AC
  Filled 2014-12-11: qty 10

## 2014-12-11 MED ORDER — MIDAZOLAM HCL 2 MG/2ML IJ SOLN
INTRAMUSCULAR | Status: AC | PRN
  Administered 2014-12-11: 1 mg via INTRAVENOUS

## 2014-12-11 MED ORDER — SODIUM CHLORIDE 0.9 % IV SOLN: Freq: Once | INTRAVENOUS | Status: DC

## 2014-12-11 MED ORDER — FENTANYL CITRATE 0.05 MG/ML IJ SOLN
INTRAMUSCULAR | Status: AC
  Filled 2014-12-11: qty 2

## 2014-12-11 MED ORDER — FENTANYL CITRATE 0.05 MG/ML IJ SOLN
INTRAMUSCULAR | Status: AC | PRN
  Administered 2014-12-11: 50 ug via INTRAVENOUS

## 2014-12-11 NOTE — Sedation Documentation (Signed)
Patient denies pain and is resting comfortably.  

## 2014-12-11 NOTE — H&P (Signed)
Chief Complaint: Rt breast seroma  Referring Physician(s): Holderness,Howard  History of Present Illness: Tammy Randolph is a 53 y.o. female   Original B Breast augmentation 10 yrs ago Approx 7 months ago pt noted new Rt breast swelling Felt like area was hardening Dr Dessie Coma determined rupture of implant Surgery 10/21/14 for removal of ruptured Rt implant with replacement of Both Since then Left breast replacement has done well Rt breast still has swelling and tight feeling at superior portion Now scheduled for probable Rt breast seroma aspiration/drain   Past Medical History  Diagnosis Date  . Seasonal allergies   . Anxiety   . History of blood transfusion 2003  . Asthma   . Hypertension   . Depression   . Chronic fatigue     Past Surgical History  Procedure Laterality Date  . Appendectomy  1988  . Breast enhancement surgery Bilateral 2008  . Abdominal hysterectomy  2004    and USO for endometriosis  . Lumbar laminectomy  2008  . Colonoscopy w/ polypectomy  2013    Dr Fuller Plan  . Tubal ligation      x4 for endometriosis    Allergies: Penicillins; Hydrocodone-acetaminophen; Codeine; and Morphine  Medications: Prior to Admission medications   Medication Sig Start Date End Date Taking? Authorizing Provider  albuterol (PROVENTIL HFA;VENTOLIN HFA) 108 (90 BASE) MCG/ACT inhaler Inhale 1-2 puffs into the lungs every 6 (six) hours as needed for wheezing or shortness of breath.   Yes Historical Provider, MD  ALPRAZolam Duanne Moron) 1 MG tablet Take 1 mg by mouth 4 (four) times daily as needed for anxiety.    Yes Historical Provider, MD  amphetamine-dextroamphetamine (ADDERALL) 20 MG tablet Take 20 mg by mouth daily as needed (for concentration).    Yes Historical Provider, MD  buPROPion (WELLBUTRIN XL) 150 MG 24 hr tablet Take 450 mg by mouth daily. Take 3 by mouth daily 09/15/13  Yes Historical Provider, MD  carvedilol (COREG) 6.25 MG tablet Take 1.5 tablets (9.375 mg  total) by mouth 2 (two) times daily with a meal. NEED APPOINTMENT FOR FUTURE REFILLS. 11/29/14  Yes Evans Lance, MD  lamoTRIgine (LAMICTAL) 150 MG tablet Take 150 mg by mouth daily.   Yes Historical Provider, MD  losartan (COZAAR) 100 MG tablet TAKE 1 TABLET BY MOUTH DAILY 11/25/14  Yes Hendricks Limes, MD  losartan (COZAAR) 100 MG tablet Take 100 mg by mouth daily.   Yes Historical Provider, MD  montelukast (SINGULAIR) 10 MG tablet TAKE ONE TABLET BY MOUTH DAILY 10/14/11  Yes Hendricks Limes, MD  montelukast (SINGULAIR) 10 MG tablet Take 10 mg by mouth daily.   Yes Historical Provider, MD  Multiple Vitamin (MULTIVITAMIN) tablet Take 1 tablet by mouth daily.   Yes Historical Provider, MD  spironolactone (ALDACTONE) 25 MG tablet TAKE 1 TABLET BY MOUTH ONCE DAILY IN PLACE OF MAXZIDE 07/12/13  Yes Hendricks Limes, MD  spironolactone (ALDACTONE) 25 MG tablet Take 25 mg by mouth daily.   Yes Historical Provider, MD  SUMAtriptan (IMITREX) 50 MG tablet TAKE 1 TABLET BY MOUTH AS NEEDED( MONITOR BLOOD PRESSURE CLOSELY IF TAKEN)   Yes Hendricks Limes, MD  oxyCODONE (OXY IR/ROXICODONE) 5 MG immediate release tablet  09/02/13   Historical Provider, MD  SUMAtriptan (IMITREX) 50 MG tablet Take 50 mg by mouth every 2 (two) hours as needed for migraine or headache. May repeat in 2 hours if headache persists or recurs.    Historical Provider, MD  Family History  Problem Relation Age of Onset  . Colon cancer Brother 89  . Stomach cancer Maternal Uncle 65  . Colon cancer Paternal Uncle 28  . Colon cancer Maternal Grandmother 10  . Sleep apnea Father   . Sleep apnea Sister   . CVA Father   . Coronary artery disease Paternal Aunt   . Pancreatic cancer Paternal Uncle   . CVA Mother     x3  . Hypertension Mother   . Arthritis Mother   . Breast cancer Mother   . Heart Problems Mother     pacemaker  . Heart attack Maternal Uncle   . Clotting disorder Brother   . Hypertension Sister   . Hypertension  Sister     History   Social History  . Marital Status: Married    Spouse Name: N/A    Number of Children: N/A  . Years of Education: N/A   Social History Main Topics  . Smoking status: Former Smoker    Quit date: 12/26/1986  . Smokeless tobacco: Never Used     Comment: up to 1& 1/2packs /week  . Alcohol Use: 2.4 oz/week    4 Glasses of wine per week  . Drug Use: No  . Sexual Activity: None   Other Topics Concern  . None   Social History Narrative     Review of Systems: A 12 point ROS discussed and pertinent positives are indicated in the HPI above.  All other systems are negative.  Review of Systems  Constitutional: Negative for fever, activity change and appetite change.  Respiratory: Positive for chest tightness. Negative for shortness of breath.   Cardiovascular: Positive for chest pain.       Rt chest wall pain  Musculoskeletal: Negative for back pain.  Psychiatric/Behavioral: Negative for behavioral problems and confusion.    Vital Signs: BP 127/88 mmHg  Pulse 84  Temp(Src) 97.4 F (36.3 C) (Oral)  Resp 18  Ht 5\' 5"  (1.651 m)  Wt 71.668 kg (158 lb)  BMI 26.29 kg/m2  SpO2 99%  Physical Exam  Constitutional: She is oriented to person, place, and time. She appears well-nourished.  Cardiovascular: Normal rate, regular rhythm and normal heart sounds.   No murmur heard. Pulmonary/Chest: Effort normal and breath sounds normal.  Abdominal: Soft. Bowel sounds are normal.  Musculoskeletal: Normal range of motion.  Neurological: She is alert and oriented to person, place, and time.  Skin: Skin is warm and dry.  Psychiatric: She has a normal mood and affect. Her behavior is normal. Judgment and thought content normal.    Imaging: No results found.  Labs:  CBC:  Recent Labs  12/11/14 1257  WBC 6.9  HGB 14.2  HCT 42.2  PLT 247    COAGS:  Recent Labs  12/11/14 1257  INR 1.00  APTT 32    BMP: No results for input(s): NA, K, CL, CO2,  GLUCOSE, BUN, CALCIUM, CREATININE, GFRNONAA, GFRAA in the last 8760 hours.  Invalid input(s): CMP  LIVER FUNCTION TESTS: No results for input(s): BILITOT, AST, ALT, ALKPHOS, PROT, ALBUMIN in the last 8760 hours.  TUMOR MARKERS: No results for input(s): AFPTM, CEA, CA199, CHROMGRNA in the last 8760 hours.  Assessment and Plan:  Rt breast seroma Scheduled for aspiration vs drain placement Pt aware of procedure benefits and risks and agreeable to proceed Consent signed andin chart  Thank you for this interesting consult.  I greatly enjoyed meeting Tammy Randolph and look forward to participating in their  care.    I spent a total of 20 minutes face to face in clinical consultation, greater than 50% of which was counseling/coordinating care for Rt breast seroma asp/drain  Signed: Bushra Denman A 12/11/2014, 1:55 PM

## 2014-12-11 NOTE — Sedation Documentation (Signed)
400 ml of serosanguinous fluid collected and sent to the lab. Pt will be taken to RN's station for 106min recovery, then d/c home. Husband is with her to take her home.

## 2014-12-11 NOTE — Procedures (Signed)
Interventional Radiology Procedure Note  Procedure: US guided aspiration and drain placement.  Aspiration yields 400 mL serosanguinous fluid.  No obvious infection.  Fluid sent for culture and cytology. 3F drain left in place connected to JP bulb suction.   Complications: None. Recommendations: - Drain to JP bulb suction - Keep clean and dry - Will follow in drain clinic - Follow cytology and culture results  Signed,  Criselda Peaches, MD

## 2014-12-12 ENCOUNTER — Telehealth (HOSPITAL_COMMUNITY): Payer: Self-pay | Admitting: Interventional Radiology

## 2014-12-12 ENCOUNTER — Other Ambulatory Visit (HOSPITAL_COMMUNITY): Payer: Self-pay | Admitting: Interventional Radiology

## 2014-12-12 DIAGNOSIS — IMO0001 Reserved for inherently not codable concepts without codable children: Secondary | ICD-10-CM

## 2014-12-12 DIAGNOSIS — T792XXA Traumatic secondary and recurrent hemorrhage and seroma, initial encounter: Principal | ICD-10-CM

## 2014-12-12 DIAGNOSIS — T888XXA Other specified complications of surgical and medical care, not elsewhere classified, initial encounter: Principal | ICD-10-CM

## 2014-12-12 NOTE — Telephone Encounter (Signed)
Called pt, left VM for her to call and schedule appt. In drain clinic for 12/22. JM

## 2014-12-14 LAB — BODY FLUID CULTURE
Culture: NO GROWTH
Special Requests: NORMAL

## 2014-12-16 ENCOUNTER — Other Ambulatory Visit (HOSPITAL_COMMUNITY): Payer: Self-pay | Admitting: Interventional Radiology

## 2014-12-16 ENCOUNTER — Ambulatory Visit (HOSPITAL_COMMUNITY)
Admission: RE | Admit: 2014-12-16 | Discharge: 2014-12-16 | Disposition: A | Discharge status: Home / Self Care | Payer: BC Managed Care – PPO | Source: Ambulatory Visit | Attending: Interventional Radiology | Admitting: Interventional Radiology

## 2014-12-16 DIAGNOSIS — T792XXA Traumatic secondary and recurrent hemorrhage and seroma, initial encounter: Principal | ICD-10-CM

## 2014-12-16 DIAGNOSIS — T888XXA Other specified complications of surgical and medical care, not elsewhere classified, initial encounter: Principal | ICD-10-CM

## 2014-12-16 DIAGNOSIS — Z4889 Encounter for other specified surgical aftercare: Secondary | ICD-10-CM | POA: Diagnosis not present

## 2014-12-16 DIAGNOSIS — Y848 Other medical procedures as the cause of abnormal reaction of the patient, or of later complication, without mention of misadventure at the time of the procedure: Secondary | ICD-10-CM | POA: Diagnosis not present

## 2014-12-16 DIAGNOSIS — IMO0001 Reserved for inherently not codable concepts without codable children: Secondary | ICD-10-CM

## 2014-12-16 DIAGNOSIS — L7622 Postprocedural hemorrhage and hematoma of skin and subcutaneous tissue following other procedure: Secondary | ICD-10-CM | POA: Diagnosis not present

## 2014-12-16 MED ORDER — IOHEXOL 300 MG/ML  SOLN
50.0000 mL | Freq: Once | INTRAMUSCULAR | Status: AC | PRN
  Administered 2014-12-16: 1 mL

## 2014-12-22 ENCOUNTER — Other Ambulatory Visit: Payer: Self-pay | Admitting: Radiology

## 2014-12-22 ENCOUNTER — Other Ambulatory Visit (HOSPITAL_COMMUNITY): Payer: Self-pay | Admitting: Interventional Radiology

## 2014-12-22 DIAGNOSIS — IMO0002 Reserved for concepts with insufficient information to code with codable children: Secondary | ICD-10-CM

## 2014-12-23 ENCOUNTER — Other Ambulatory Visit (HOSPITAL_COMMUNITY): Payer: Self-pay | Admitting: Interventional Radiology

## 2014-12-23 ENCOUNTER — Ambulatory Visit (HOSPITAL_COMMUNITY): Payer: BC Managed Care – PPO

## 2014-12-23 ENCOUNTER — Other Ambulatory Visit (HOSPITAL_COMMUNITY): Payer: BC Managed Care – PPO | Admitting: Interventional Radiology

## 2014-12-23 ENCOUNTER — Ambulatory Visit (HOSPITAL_COMMUNITY)
Admission: RE | Admit: 2014-12-23 | Discharge: 2014-12-23 | Disposition: A | Discharge status: Home / Self Care | Payer: BC Managed Care – PPO | Source: Ambulatory Visit | Attending: Interventional Radiology | Admitting: Interventional Radiology

## 2014-12-23 DIAGNOSIS — IMO0002 Reserved for concepts with insufficient information to code with codable children: Secondary | ICD-10-CM

## 2014-12-23 DIAGNOSIS — Z9882 Breast implant status: Secondary | ICD-10-CM | POA: Diagnosis not present

## 2014-12-23 DIAGNOSIS — F329 Major depressive disorder, single episode, unspecified: Secondary | ICD-10-CM | POA: Insufficient documentation

## 2014-12-23 DIAGNOSIS — J45909 Unspecified asthma, uncomplicated: Secondary | ICD-10-CM | POA: Diagnosis not present

## 2014-12-23 DIAGNOSIS — Z79899 Other long term (current) drug therapy: Secondary | ICD-10-CM | POA: Insufficient documentation

## 2014-12-23 DIAGNOSIS — Z87891 Personal history of nicotine dependence: Secondary | ICD-10-CM | POA: Diagnosis not present

## 2014-12-23 DIAGNOSIS — L7622 Postprocedural hemorrhage and hematoma of skin and subcutaneous tissue following other procedure: Secondary | ICD-10-CM | POA: Insufficient documentation

## 2014-12-23 DIAGNOSIS — F419 Anxiety disorder, unspecified: Secondary | ICD-10-CM | POA: Diagnosis not present

## 2014-12-23 DIAGNOSIS — I10 Essential (primary) hypertension: Secondary | ICD-10-CM | POA: Diagnosis not present

## 2014-12-23 DIAGNOSIS — N6489 Other specified disorders of breast: Secondary | ICD-10-CM | POA: Diagnosis present

## 2014-12-23 LAB — CBC WITH DIFFERENTIAL/PLATELET
Basophils Absolute: 0 10*3/uL (ref 0.0–0.1)
Basophils Relative: 1 % (ref 0–1)
Eosinophils Absolute: 0.2 10*3/uL (ref 0.0–0.7)
Eosinophils Relative: 3 % (ref 0–5)
HCT: 38.7 % (ref 36.0–46.0)
HEMOGLOBIN: 13.2 g/dL (ref 12.0–15.0)
LYMPHS PCT: 20 % (ref 12–46)
Lymphs Abs: 1.6 10*3/uL (ref 0.7–4.0)
MCH: 30.5 pg (ref 26.0–34.0)
MCHC: 34.1 g/dL (ref 30.0–36.0)
MCV: 89.4 fL (ref 78.0–100.0)
MONOS PCT: 10 % (ref 3–12)
Monocytes Absolute: 0.8 10*3/uL (ref 0.1–1.0)
Neutro Abs: 5.2 10*3/uL (ref 1.7–7.7)
Neutrophils Relative %: 66 % (ref 43–77)
PLATELETS: 390 10*3/uL (ref 150–400)
RBC: 4.33 MIL/uL (ref 3.87–5.11)
RDW: 12 % (ref 11.5–15.5)
WBC: 7.9 10*3/uL (ref 4.0–10.5)

## 2014-12-23 MED ORDER — LIDOCAINE HCL (PF) 1 % IJ SOLN
INTRAMUSCULAR | Status: AC
  Filled 2014-12-23: qty 5

## 2014-12-23 MED ORDER — FENTANYL CITRATE 0.05 MG/ML IJ SOLN
INTRAMUSCULAR | Status: AC | PRN
  Administered 2014-12-23: 50 ug via INTRAVENOUS

## 2014-12-23 MED ORDER — SODIUM CHLORIDE 0.9 % IV SOLN
INTRAVENOUS | Status: DC
  Administered 2014-12-23: 11:00:00 via INTRAVENOUS

## 2014-12-23 MED ORDER — MIDAZOLAM HCL 2 MG/2ML IJ SOLN
INTRAMUSCULAR | Status: AC
  Filled 2014-12-23: qty 2

## 2014-12-23 MED ORDER — FENTANYL CITRATE 0.05 MG/ML IJ SOLN
INTRAMUSCULAR | Status: AC
  Filled 2014-12-23: qty 2

## 2014-12-23 MED ORDER — MIDAZOLAM HCL 2 MG/2ML IJ SOLN
INTRAMUSCULAR | Status: AC | PRN
  Administered 2014-12-23: 1 mg via INTRAVENOUS

## 2014-12-23 NOTE — H&P (Signed)
Chief Complaint: Recurrent right breast seroma  Referring Physician(s): Dr. Dessie Coma  History of Present Illness: Tammy Randolph is a 53 y.o. female status post breast augmentation requiring revision for a ruptured implant. Patient developed a right breast postoperative seroma. This was percutaneously drained 12/11/2014. Due to minimal output the drain was removed on 12/16/14. She now has increased swelling at site and concern is  for recurrent seroma. She presents today for US guided aspiration/possible drainage of the seroma.  Past Medical History  Diagnosis Date  . Seasonal allergies   . Anxiety   . History of blood transfusion 2003  . Asthma   . Hypertension   . Depression   . Chronic fatigue     Past Surgical History  Procedure Laterality Date  . Appendectomy  1988  . Breast enhancement surgery Bilateral 2008  . Abdominal hysterectomy  2004    and USO for endometriosis  . Lumbar laminectomy  2008  . Colonoscopy w/ polypectomy  2013    Dr Fuller Plan  . Tubal ligation      x4 for endometriosis    Allergies: Penicillins; Hydrocodone-acetaminophen; Codeine; and Morphine  Medications: Prior to Admission medications   Medication Sig Start Date End Date Taking? Authorizing Provider  albuterol (PROVENTIL HFA;VENTOLIN HFA) 108 (90 BASE) MCG/ACT inhaler Inhale 1-2 puffs into the lungs every 6 (six) hours as needed for wheezing or shortness of breath.   Yes Historical Provider, MD  ALPRAZolam Duanne Moron) 1 MG tablet Take 1 mg by mouth 4 (four) times daily as needed for anxiety.    Yes Historical Provider, MD  amphetamine-dextroamphetamine (ADDERALL) 20 MG tablet Take 20 mg by mouth daily as needed (for concentration).    Yes Historical Provider, MD  buPROPion (WELLBUTRIN XL) 150 MG 24 hr tablet Take 450 mg by mouth daily.  09/15/13  Yes Historical Provider, MD  carvedilol (COREG) 6.25 MG tablet Take 1.5 tablets (9.375 mg total) by mouth 2 (two) times daily with a meal. NEED  APPOINTMENT FOR FUTURE REFILLS. 11/29/14  Yes Evans Lance, MD  lamoTRIgine (LAMICTAL) 150 MG tablet Take 150 mg by mouth daily.   Yes Historical Provider, MD  losartan (COZAAR) 100 MG tablet TAKE 1 TABLET BY MOUTH DAILY 11/25/14  Yes Hendricks Limes, MD  montelukast (SINGULAIR) 10 MG tablet TAKE ONE TABLET BY MOUTH DAILY 10/14/11  Yes Hendricks Limes, MD  oxyCODONE (OXY IR/ROXICODONE) 5 MG immediate release tablet Take 5 mg by mouth every 6 (six) hours as needed (pain).  09/02/13  Yes Historical Provider, MD  spironolactone (ALDACTONE) 25 MG tablet TAKE 1 TABLET BY MOUTH ONCE DAILY IN PLACE OF MAXZIDE 07/12/13  Yes Hendricks Limes, MD  SUMAtriptan (IMITREX) 50 MG tablet Take 50 mg by mouth every 2 (two) hours as needed for migraine or headache. May repeat in 2 hours if headache persists or recurs.   Yes Historical Provider, MD  XARTEMIS XR 7.5-325 MG TBCR Take 1 tablet by mouth 2 (two) times daily as needed (pain).  12/17/14  Yes Historical Provider, MD  SUMAtriptan (IMITREX) 50 MG tablet TAKE 1 TABLET BY MOUTH AS NEEDED( MONITOR BLOOD PRESSURE CLOSELY IF TAKEN) Patient not taking: Reported on 12/23/2014    Hendricks Limes, MD    Family History  Problem Relation Age of Onset  . Colon cancer Brother 40  . Stomach cancer Maternal Uncle 7  . Colon cancer Paternal Uncle 8  . Colon cancer Maternal Grandmother 52  . Sleep apnea Father   .  Sleep apnea Sister   . CVA Father   . Coronary artery disease Paternal Aunt   . Pancreatic cancer Paternal Uncle   . CVA Mother     x3  . Hypertension Mother   . Arthritis Mother   . Breast cancer Mother   . Heart Problems Mother     pacemaker  . Heart attack Maternal Uncle   . Clotting disorder Brother   . Hypertension Sister   . Hypertension Sister     History   Social History  . Marital Status: Married    Spouse Name: N/A    Number of Children: N/A  . Years of Education: N/A   Social History Main Topics  . Smoking status: Former Smoker      Quit date: 12/26/1986  . Smokeless tobacco: Never Used     Comment: up to 1& 1/2packs /week  . Alcohol Use: 2.4 oz/week    4 Glasses of wine per week  . Drug Use: No  . Sexual Activity: Not on file   Other Topics Concern  . Not on file   Social History Narrative     Review of Systems   Constitutional: Negative for fever and chills.  Respiratory: Negative for cough and shortness of breath.   Cardiovascular: Negative for chest pain.       Mild soreness /swelling over rt breast  Gastrointestinal: Negative for nausea, vomiting, abdominal pain and blood in stool.  Genitourinary: Negative for dysuria and hematuria.  Musculoskeletal: Positive for back pain.  Neurological: Negative for headaches.    Vital Signs: BP 122/86 mmHg  Pulse 80  Temp(Src) 98.2 F (36.8 C) (Oral)  Resp 20  Ht 5\' 5"  (1.651 m)  Wt 156 lb (70.761 kg)  BMI 25.96 kg/m2  SpO2 97%  Physical Exam  Constitutional: She is oriented to person, place, and time. She appears well-developed and well-nourished.  Cardiovascular: Normal rate and regular rhythm.   Pulmonary/Chest: Effort normal.  Sl dim BS rt base, left clear  Abdominal: Soft. Bowel sounds are normal. There is no tenderness.  Musculoskeletal: Normal range of motion. She exhibits no edema.  Neurological: She is alert and oriented to person, place, and time.    Imaging: US Aspiration  12/11/2014   CLINICAL DATA:  53 year old female with a history of a ruptured right subpectoral silicone breast implant. She underwent removal of the ruptured implant and revision with placement of bilateral prepectoral implants. Her left breast revision has healed well without complication. However, on the right she has a persistent large, tense and painful subpectoral fluid collection. Ultrasound-guided aspiration and possible drain placement is requested. 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  EXAM: US ASPIRATION  Date: 12/11/2014  PROCEDURE: 1. Ultrasound-guided aspiration and drain placement Interventional Radiologist:  Criselda Peaches, MD  ANESTHESIA/SEDATION: Moderate (conscious) sedation was used. 1 mg Versed, 50 mcg Fentanyl were administered intravenously. The patient's vital signs were monitored continuously by radiology nursing throughout the procedure.  Sedation Time: 26 minutes  MEDICATIONS: None  TECHNIQUE: Informed consent was obtained from the patient following explanation of the procedure, risks, benefits and alternatives. The patient understands, agrees and consents for the procedure. All questions were addressed. A time out was performed.  The right breast was interrogated with ultrasound. There is a large subpectoral fluid collection. The newly placed implant is displaced anteriorly and somewhat difficult to identify. Sonographic images were therefore obtained of the left  breast to ensure appropriate identification of the prosthesis. Ultimately, ultimately, a suitable skin entry site along the inferior aspect of the breast in the 6 o'clock position was selected and marked. The region was then sterilely prepped and draped in standard fashion using Betadine skin prep. Local anesthesia was attained by infiltration with 1% lidocaine.  A small dermatotomy was made. Under real-time sonographic guidance, a 5 [place] Yueh centesis catheter was advanced into the subpectoral fluid collection. Rapid return of clear serosanguineous fluid was and countered. Given the overall volume of the fluid present, and the pressure, the decision was made to proceed with placement of a drain given the high likelihood of fluid re-accumulation after simple aspiration.  The catheter was connected to a three-way stopcock and drainage bag and several 100 mL of fluid was removed. Once the pressure was reduced, a 0.035 inch Amplatz wire was advanced through the Yueh centesis catheter in into  the subpectoral collection. The skin tract was then dilated to 8 [place] and a [place] all-purpose drainage catheter modified with additional side holes was advanced over the wire and formed within the right subpectoral space. Aspiration was then continued. A total of 400 mL serosanguineous fluid was aspirated. Post drainage ultrasound interrogation demonstrated near-total resolution of the subpectoral fluid collection. There are still a few small remaining pockets of fluid. The catheter was secured to the skin with 0 Prolene suture. A sterile bandage was applied. The patient tolerated the procedure well.  COMPLICATIONS: None  IMPRESSION: 1. Successful placement of a 10 French drainage catheter into the subpectoral fluid collection via an inframammary approach. Approximately 400 mL of serosanguineous fluid was aspirated. Samples were sent for both cytology and culture.  PLAN: A drain was left given the overall volume of fluid, pressure and concern for reaccumulation of fluid after simple aspiration. The drain should be maintained to JP bulb suction. Patient will be reassessed in a 1 week at the [facility]. If fluid output is a minimal, the drain a may be removed at that time.  However, if there is persistent drainage drain be left in place.  Signed,  Criselda Peaches, MD  [facility]  [facility]   Electronically Signed   By: Jacqulynn Cadet M.D.   On: 12/11/2014 16:20   Ir Radiologist Eval & Mgmt  12/16/2014   EXAM: Established Patient Outpatient Visit:  Level 2  CHIEF COMPLAINT: Right breast postoperative seroma. Status post ultrasound percutaneous drainage 12/11/2014.  HISTORY OF PRESENT ILLNESS: 53 year old female status post breast augmentation requiring revision for a ruptured implant. Patient developed a right breast postoperative seroma. This was percutaneously drained 12/11/2014. Over the last week there has been no significant output with  approximately less than 5 cc the last 2 days. Drainage is clear and serosanguineous. No signs of infection or cellulitis. No fevers.  PHYSICAL EXAMINATION: Right breast drain catheter site is clean and intact. Scant serosanguineous clear drainage in the JP bulb. No significant residual subcutaneous fluid collection by exam.  IMAGING:  None.  ASSESSMENT AND PLAN: Assessment: Resolved right breast postoperative seroma status post percutaneous drainage 12/11/2014.  Plan: Drain catheter will be removed today. Follow-up with plastic surgery in 2 weeks.   Electronically Signed   By: Daryll Brod M.D.   On: 12/16/2014 10:35    Labs:  CBC:  Recent Labs  12/11/14 1257 12/23/14 1000  WBC 6.9 7.9  HGB 14.2 13.2  HCT 42.2 38.7  PLT 247 390  COAGS:  Recent Labs  12/11/14 1257  INR 1.00  APTT 32    BMP: No results for input(s): NA, K, CL, CO2, GLUCOSE, BUN, CALCIUM, CREATININE, GFRNONAA, GFRAA in the last 8760 hours.  Invalid input(s): CMP  LIVER FUNCTION TESTS: No results for input(s): BILITOT, AST, ALT, ALKPHOS, PROT, ALBUMIN in the last 8760 hours.  TUMOR MARKERS: No results for input(s): AFPTM, CEA, CA199, CHROMGRNA in the last 8760 hours.  Assessment and Plan: ALEXANDRA LIPPS is a 53 y.o. female status post breast augmentation requiring revision for a ruptured implant. Patient developed a right breast postoperative seroma. This was percutaneously drained 12/11/2014 (cyt/cx's neg). Due to minimal output the drain was removed on 12/16/14. She now has increased swelling at site and concern is  for recurrent seroma. She presents today for US guided aspiration/possible drainage of the seroma. Details/risks of procedure d/w pt/husband with their understanding and consent.      Signed: Autumn Messing 12/23/2014, 12:12 PM

## 2014-12-23 NOTE — Sedation Documentation (Signed)
Patient denies pain and is resting comfortably.  

## 2014-12-23 NOTE — Sedation Documentation (Signed)
Pt d/c from [place], A/O denies any pain or discomfort. Accompanied by her husband. Taken to her vehicle assisted by this RN and in a W/C.

## 2014-12-30 ENCOUNTER — Other Ambulatory Visit: Payer: Self-pay | Admitting: Internal Medicine

## 2014-12-31 ENCOUNTER — Other Ambulatory Visit (HOSPITAL_COMMUNITY): Payer: Self-pay | Admitting: Interventional Radiology

## 2014-12-31 DIAGNOSIS — IMO0002 Reserved for concepts with insufficient information to code with codable children: Secondary | ICD-10-CM

## 2015-01-20 ENCOUNTER — Other Ambulatory Visit: Payer: Self-pay | Admitting: Internal Medicine

## 2015-01-22 ENCOUNTER — Ambulatory Visit (HOSPITAL_COMMUNITY): Payer: BLUE CROSS/BLUE SHIELD | Attending: Cardiology | Admitting: Cardiology

## 2015-01-22 ENCOUNTER — Other Ambulatory Visit (HOSPITAL_COMMUNITY): Payer: Self-pay | Admitting: *Deleted

## 2015-01-22 DIAGNOSIS — M79604 Pain in right leg: Secondary | ICD-10-CM

## 2015-01-22 DIAGNOSIS — M7989 Other specified soft tissue disorders: Secondary | ICD-10-CM | POA: Diagnosis not present

## 2015-01-22 NOTE — Progress Notes (Signed)
Right lower venous duplex performed

## 2015-02-03 ENCOUNTER — Other Ambulatory Visit: Payer: Self-pay | Admitting: *Deleted

## 2015-02-03 DIAGNOSIS — M7989 Other specified soft tissue disorders: Secondary | ICD-10-CM

## 2015-02-10 ENCOUNTER — Other Ambulatory Visit: Payer: Self-pay | Admitting: Internal Medicine

## 2015-02-11 ENCOUNTER — Other Ambulatory Visit: Payer: Self-pay | Admitting: Internal Medicine

## 2015-02-19 ENCOUNTER — Encounter: Payer: Self-pay | Admitting: Vascular Surgery

## 2015-02-20 ENCOUNTER — Encounter: Payer: BLUE CROSS/BLUE SHIELD | Admitting: Vascular Surgery

## 2015-02-20 ENCOUNTER — Encounter (HOSPITAL_COMMUNITY): Payer: BLUE CROSS/BLUE SHIELD

## 2015-03-10 ENCOUNTER — Encounter: Payer: BLUE CROSS/BLUE SHIELD | Admitting: Vascular Surgery

## 2015-03-10 ENCOUNTER — Encounter (HOSPITAL_COMMUNITY): Payer: BLUE CROSS/BLUE SHIELD

## 2015-04-06 ENCOUNTER — Other Ambulatory Visit: Payer: Self-pay | Admitting: Internal Medicine

## 2015-04-07 ENCOUNTER — Other Ambulatory Visit: Payer: Self-pay

## 2015-04-07 MED ORDER — LOSARTAN POTASSIUM 100 MG PO TABS: 100.0000 mg | ORAL_TABLET | Freq: Every day | ORAL | Status: DC

## 2015-04-07 NOTE — Telephone Encounter (Signed)
rx for losartan refilled, but patient needs office visit before we can refill again

## 2015-04-15 ENCOUNTER — Other Ambulatory Visit: Payer: Self-pay | Admitting: Gynecology

## 2015-04-16 LAB — CYTOLOGY - PAP

## 2015-05-08 ENCOUNTER — Encounter: Payer: Self-pay | Admitting: Vascular Surgery

## 2015-05-08 ENCOUNTER — Other Ambulatory Visit: Payer: Self-pay | Admitting: Internal Medicine

## 2015-05-12 ENCOUNTER — Ambulatory Visit (HOSPITAL_COMMUNITY)
Admission: RE | Admit: 2015-05-12 | Discharge: 2015-05-12 | Disposition: A | Discharge status: Home / Self Care | Payer: BLUE CROSS/BLUE SHIELD | Source: Ambulatory Visit | Attending: Vascular Surgery | Admitting: Vascular Surgery

## 2015-05-12 ENCOUNTER — Encounter: Payer: Self-pay | Admitting: Vascular Surgery

## 2015-05-12 ENCOUNTER — Ambulatory Visit (INDEPENDENT_AMBULATORY_CARE_PROVIDER_SITE_OTHER): Payer: BLUE CROSS/BLUE SHIELD | Admitting: Vascular Surgery

## 2015-05-12 ENCOUNTER — Other Ambulatory Visit: Payer: Self-pay | Admitting: Vascular Surgery

## 2015-05-12 VITALS — BP 145/101 | HR 92 | Resp 16 | Ht 64.5 in | Wt 160.0 lb

## 2015-05-12 DIAGNOSIS — M7989 Other specified soft tissue disorders: Secondary | ICD-10-CM

## 2015-05-12 DIAGNOSIS — I8391 Asymptomatic varicose veins of right lower extremity: Secondary | ICD-10-CM | POA: Diagnosis not present

## 2015-05-12 NOTE — Progress Notes (Signed)
Subjective:     Patient ID: Tammy Randolph, female   DOB: Oct 22, 1961, 54 y.o.   MRN: [id number]  HPI this 54 year old female was referred by Dr. Elsie Saas for evaluation of right leg edema. 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 She has had edema in the right distal thigh and knee area and at times extending proximally and distally since that time. It has improved slightly. She does not relate to compression stockings. She has no history of DVT or thrombophlebitis. She denies a history of varicose veins.  Past Medical History  Diagnosis Date  . Seasonal allergies   . Anxiety   . History of blood transfusion 2003  . Asthma   . Hypertension   . Depression   . Chronic fatigue     History  Substance Use Topics  . Smoking status: Former Smoker    Quit date: 12/26/1986  . Smokeless tobacco: Never Used     Comment: up to 1& 1/2packs /week  . Alcohol Use: 2.4 oz/week    4 Glasses of wine per week    Family History  Problem Relation Age of Onset  . Colon cancer Brother 27  . Stomach cancer Maternal Uncle 52  . Colon cancer Paternal Uncle 85  . Colon cancer Maternal Grandmother 10  . Sleep apnea Father   . Sleep apnea Sister   . CVA Father   . Coronary artery disease Paternal Aunt   . Pancreatic cancer Paternal Uncle   . CVA Mother     x3  . Hypertension Mother   . Arthritis Mother   . Breast cancer Mother   . Heart Problems Mother     pacemaker  . Heart attack Maternal Uncle   . Clotting disorder Brother   . Hypertension Sister   . Hypertension Sister     Allergies  Allergen Reactions  . Penicillins     REACTION: respiratory & urticaria issues age 55  . Hydrocodone-Acetaminophen     REACTION: itching w/o rash  . Codeine     REACTION: makes her hyper; she can take Tramadol  . Morphine      REACTION: makes her hyper     Current outpatient prescriptions:  .  albuterol (PROVENTIL HFA;VENTOLIN HFA) 108 (90 BASE) MCG/ACT inhaler, Inhale 1-2 puffs into the lungs every 6 (six) hours as needed for wheezing or shortness of breath., Disp: , Rfl:  .  ALPRAZolam (XANAX) 1 MG tablet, Take 1 mg by mouth 4 (four) times daily as needed for anxiety. , Disp: , Rfl:  .  buPROPion (WELLBUTRIN XL) 150 MG 24 hr tablet, Take 450 mg by mouth daily. , Disp: , Rfl:  .  carvedilol (COREG) 6.25 MG tablet, TAKE 1 AND 1/2 TABLET BY MOUTH TWICE DAILY WITH FOOD, Disp: 30 tablet, Rfl: 0 .  celecoxib (CELEBREX) 50 MG capsule, Take 50 mg by mouth 2 (two) times daily., Disp: , Rfl:  .  lamoTRIgine (LAMICTAL) 150 MG tablet, Take 150 mg by mouth daily., Disp: , Rfl:  .  losartan (COZAAR) 100 MG tablet, Take 1 tablet (100 mg total) by mouth daily., Disp: 30 tablet, Rfl: 0 .  montelukast (SINGULAIR) 10 MG tablet, TAKE ONE TABLET BY MOUTH DAILY, Disp: 30 tablet, Rfl: 5 .  oxyCODONE (OXY IR/ROXICODONE) 5 MG immediate release tablet, Take 5 mg by mouth every 6 (six)  hours as needed (pain). , Disp: , Rfl:  .  spironolactone (ALDACTONE) 25 MG tablet, TAKE 1 TABLET BY MOUTH EVERY DAY( IN PLACE OF MAXZIDE), Disp: 30 tablet, Rfl: 0 .  SUMAtriptan (IMITREX) 50 MG tablet, TAKE 1 TABLET BY MOUTH AS NEEDED( MONITOR BLOOD PRESSURE CLOSELY IF TAKEN), Disp: 10 tablet, Rfl: 1 .  SUMAtriptan (IMITREX) 50 MG tablet, Take 50 mg by mouth every 2 (two) hours as needed for migraine or headache. May repeat in 2 hours if headache persists or recurs., Disp: , Rfl:  .  XARTEMIS XR 7.5-325 MG TBCR, Take 1 tablet by mouth 2 (two) times daily as needed (pain). , Disp: , Rfl: 0 .  amphetamine-dextroamphetamine (ADDERALL) 20 MG tablet, Take 20 mg by mouth daily as needed (for concentration). , Disp: , Rfl:   Filed Vitals:   05/12/15 1012 05/12/15 1013  BP: 146/106 145/101  Pulse: 89 92  Resp: 16 16  Height: 5' 4.5" (1.638 m)   Weight: 160 lb  (72.576 kg)     Body mass index is 27.05 kg/(m^2).           Review of Systems denies chest pain, dyspnea on exertion, PND, orthopnea. All systems negative except for the right lower extremity     Objective:   Physical Exam BP 145/101 mmHg  Pulse 92  Resp 16  Ht 5' 4.5" (1.638 m)  Wt 160 lb (72.576 kg)  BMI 27.05 kg/m2  Gen.-alert and oriented x3 in no apparent distress HEENT normal for age Lungs no rhonchi or wheezing Cardiovascular regular rhythm no murmurs carotid pulses 3+ palpable no bruits audible Abdomen soft nontender no palpable masses Musculoskeletal free of  major deformities Skin clear -no rashes Neurologic normal Lower extremities 3+ femoral and dorsalis pedis pulses palpable bilaterally with no edema Right calves and thighs essentially equal in circumference to measurement. Appears to have mild edema in the knee area. No distal edema noted at the ankles. No hyperpigmentation ulceration or varicosities noted. 3+ to Salas pedis pulse palpable bilaterally.  Today I ordered a venous duplex exam of the right leg which I reviewed and interpreted. There is no DVT. There is no superficial reflux. There is very mild venous incompetence in the right common femoral vein which should not be a significant finding in this patient       Assessment:     Chronic mild edema right leg following traumatic injury right knee with extensive reconstruction including anterior cruciate ligament, MCL, and meniscus No evidence of significant venous pathology    Plan:     Elevate foot of bed at night If swelling continues to be a problem throughout the day of elastic compression stocking would be the only other suggestion No further vascular evaluation indicated

## 2015-05-19 ENCOUNTER — Other Ambulatory Visit: Payer: Self-pay | Admitting: *Deleted

## 2015-06-05 ENCOUNTER — Other Ambulatory Visit: Payer: Self-pay | Admitting: Emergency Medicine

## 2015-07-22 ENCOUNTER — Other Ambulatory Visit: Payer: Self-pay | Admitting: Urology

## 2015-07-22 DIAGNOSIS — N361 Urethral diverticulum: Secondary | ICD-10-CM

## 2015-07-28 ENCOUNTER — Ambulatory Visit (HOSPITAL_COMMUNITY)
Admission: RE | Admit: 2015-07-28 | Discharge: 2015-07-28 | Disposition: A | Discharge status: Home / Self Care | Payer: BLUE CROSS/BLUE SHIELD | Source: Ambulatory Visit | Attending: Urology | Admitting: Urology

## 2015-07-28 DIAGNOSIS — N361 Urethral diverticulum: Secondary | ICD-10-CM

## 2015-07-28 DIAGNOSIS — Z87440 Personal history of urinary (tract) infections: Secondary | ICD-10-CM | POA: Diagnosis present

## 2015-07-28 DIAGNOSIS — R934 Abnormal findings on diagnostic imaging of urinary organs: Secondary | ICD-10-CM | POA: Diagnosis not present

## 2015-07-28 LAB — POCT I-STAT CREATININE: Creatinine, Ser: 0.8 mg/dL (ref 0.44–1.00)

## 2015-07-28 MED ORDER — GADOBENATE DIMEGLUMINE 529 MG/ML IV SOLN
15.0000 mL | Freq: Once | INTRAVENOUS | Status: AC | PRN
  Administered 2015-07-28: 14 mL via INTRAVENOUS

## 2015-09-03 ENCOUNTER — Telehealth: Payer: Self-pay | Admitting: Behavioral Health

## 2015-09-03 ENCOUNTER — Encounter: Payer: Self-pay | Admitting: Behavioral Health

## 2015-09-03 NOTE — Telephone Encounter (Signed)
Pre-Visit Call completed with patient and chart updated.   Pre-Visit Info documented in Specialty Comments under SnapShot.    

## 2015-09-04 ENCOUNTER — Ambulatory Visit (INDEPENDENT_AMBULATORY_CARE_PROVIDER_SITE_OTHER): Payer: BLUE CROSS/BLUE SHIELD | Admitting: Family Medicine

## 2015-09-04 ENCOUNTER — Encounter: Payer: Self-pay | Admitting: Family Medicine

## 2015-09-04 VITALS — BP 159/111 | HR 97 | Temp 97.9°F | Ht 64.0 in | Wt 163.0 lb

## 2015-09-04 DIAGNOSIS — E785 Hyperlipidemia, unspecified: Secondary | ICD-10-CM | POA: Diagnosis not present

## 2015-09-04 DIAGNOSIS — F329 Major depressive disorder, single episode, unspecified: Secondary | ICD-10-CM | POA: Diagnosis not present

## 2015-09-04 DIAGNOSIS — G43809 Other migraine, not intractable, without status migrainosus: Secondary | ICD-10-CM

## 2015-09-04 DIAGNOSIS — I10 Essential (primary) hypertension: Secondary | ICD-10-CM | POA: Diagnosis not present

## 2015-09-04 DIAGNOSIS — F32A Depression, unspecified: Secondary | ICD-10-CM

## 2015-09-04 LAB — COMPREHENSIVE METABOLIC PANEL
ALBUMIN: 4.7 g/dL (ref 3.5–5.2)
ALK PHOS: 98 U/L (ref 39–117)
ALT: 21 U/L (ref 0–35)
AST: 16 U/L (ref 0–37)
BUN: 12 mg/dL (ref 6–23)
CHLORIDE: 98 meq/L (ref 96–112)
CO2: 35 mEq/L — ABNORMAL HIGH (ref 19–32)
Calcium: 10.2 mg/dL (ref 8.4–10.5)
Creatinine, Ser: 0.89 mg/dL (ref 0.40–1.20)
GFR: 70.86 mL/min (ref >60.00)
Glucose, Bld: 88 mg/dL (ref 70–99)
POTASSIUM: 4.8 meq/L (ref 3.5–5.1)
Sodium: 138 mEq/L (ref 135–145)
TOTAL PROTEIN: 7.8 g/dL (ref 6.0–8.3)
Total Bilirubin: 0.4 mg/dL (ref 0.2–1.2)

## 2015-09-04 LAB — LIPID PANEL
CHOLESTEROL: 196 mg/dL (ref 0–200)
HDL: 67.2 mg/dL (ref >39.00)
LDL CALC: 97 mg/dL (ref 0–99)
NonHDL: 128.37
TRIGLYCERIDES: 156 mg/dL — AB (ref 0.0–149.0)
Total CHOL/HDL Ratio: 3
VLDL: 31.2 mg/dL (ref 0.0–40.0)

## 2015-09-04 MED ORDER — CARVEDILOL 12.5 MG PO TABS: 12.5000 mg | ORAL_TABLET | Freq: Two times a day (BID) | ORAL | Status: DC

## 2015-09-04 MED ORDER — SUMATRIPTAN SUCCINATE 50 MG PO TABS
ORAL_TABLET | ORAL | Status: DC
Start: 2015-09-04 — End: 2016-09-12

## 2015-09-04 MED ORDER — SPIRONOLACTONE 25 MG PO TABS: 25.0000 mg | ORAL_TABLET | Freq: Once | ORAL | Status: DC

## 2015-09-04 NOTE — Patient Instructions (Signed)
Back Pain, Adult Low back pain is very common. About 1 in 5 people have back pain.The cause of low back pain is rarely dangerous. The pain often gets better over time.About half of people with a sudden onset of back pain feel better in just 2 weeks. About 8 in 10 people feel better by 6 weeks.  CAUSES Some common causes of back pain include:  Strain of the muscles or ligaments supporting the spine.  Wear and tear (degeneration) of the spinal discs.  Arthritis.  Direct injury to the back. DIAGNOSIS Most of the time, the direct cause of low back pain is not known.However, back pain can be treated effectively even when the exact cause of the pain is unknown.Answering your caregiver's questions about your overall health and symptoms is one of the most accurate ways to make sure the cause of your pain is not dangerous. If your caregiver needs more information, he or she may order lab work or imaging tests (X-rays or MRIs).However, even if imaging tests show changes in your back, this usually does not require surgery. HOME CARE INSTRUCTIONS For many people, back pain returns.Since low back pain is rarely dangerous, it is often a condition that people can learn to manageon their own.   Remain active. It is stressful on the back to sit or stand in one place. Do not sit, drive, or stand in one place for more than 30 minutes at a time. Take short walks on level surfaces as soon as pain allows.Try to increase the length of time you walk each day.  Do not stay in bed.Resting more than 1 or 2 days can delay your recovery.  Do not avoid exercise or work.Your body is made to move.It is not dangerous to be active, even though your back may hurt.Your back will likely heal faster if you return to being active before your pain is gone.  Pay attention to your body when you bend and lift. Many people have less discomfortwhen lifting if they bend their knees, keep the load close to their bodies,and  avoid twisting. Often, the most comfortable positions are those that put less stress on your recovering back.  Find a comfortable position to sleep. Use a firm mattress and lie on your side with your knees slightly bent. If you lie on your back, put a pillow under your knees.  Only take over-the-counter or prescription medicines as directed by your caregiver. Over-the-counter medicines to reduce pain and inflammation are often the most helpful.Your caregiver may prescribe muscle relaxant drugs.These medicines help dull your pain so you can more quickly return to your normal activities and healthy exercise.  Put ice on the injured area.  Put ice in a plastic bag.  Place a towel between your skin and the bag.  Leave the ice on for 15-20 minutes, 03-04 times a day for the first 2 to 3 days. After that, ice and heat may be alternated to reduce pain and spasms.  Ask your caregiver about trying back exercises and gentle massage. This may be of some benefit.  Avoid feeling anxious or stressed.Stress increases muscle tension and can worsen back pain.It is important to recognize when you are anxious or stressed and learn ways to manage it.Exercise is a great option. SEEK MEDICAL CARE IF:  You have pain that is not relieved with rest or medicine.  You have pain that does not improve in 1 week.  You have new symptoms.  You are generally not feeling well. SEEK   IMMEDIATE MEDICAL CARE IF:   You have pain that radiates from your back into your legs.  You develop new bowel or bladder control problems.  You have unusual weakness or numbness in your arms or legs.  You develop nausea or vomiting.  You develop abdominal pain.  You feel faint. Document Released: 12/12/2005 Document Revised: 06/12/2012 Document Reviewed: 04/15/2014 ExitCare Patient Information 2015 [employer]. This information is not intended to replace advice given to you by your health care provider. Make sure you  discuss any questions you have with your health care provider.  

## 2015-09-04 NOTE — Progress Notes (Signed)
Pre visit review using our clinic review tool, if applicable. No additional management support is needed unless otherwise documented below in the visit note. 

## 2015-09-04 NOTE — Progress Notes (Signed)
Patient ID: Tammy Randolph, female    DOB: 08/29/1961  Age: 54 y.o. MRN: [id number]    Subjective:  Subjective HPI [name] presents for to establish.  She c/o low back and neck pain-- she was scheduled to have surgery on both with dr Rolena Infante but got scared and cancelled.   She has an appointment with Dr at [facility].  She is also under a lot of stress with her mom taking care of her nephew since his parents passed away.  She also needs f/u htn.  No ha, cp or sob.   Review of Systems  Constitutional: Negative for diaphoresis, appetite change, fatigue and unexpected weight change.  Eyes: Negative for pain, redness and visual disturbance.  Respiratory: Negative for cough, chest tightness, shortness of breath and wheezing.   Cardiovascular: Negative for chest pain, palpitations and leg swelling.  Endocrine: Negative for cold intolerance, heat intolerance, polydipsia, polyphagia and polyuria.  Genitourinary: Negative for dysuria, frequency and difficulty urinating.  Musculoskeletal: Positive for back pain and neck pain.  Neurological: Negative for dizziness, light-headedness, numbness and headaches.    History Past Medical History  Diagnosis Date  . Seasonal allergies   . Anxiety   . History of blood transfusion 2003  . Asthma   . Hypertension   . Depression   . Chronic fatigue     She has past surgical history that includes Appendectomy (1988); Breast enhancement surgery (Bilateral, 2008); Abdominal hysterectomy (2004); Lumbar laminectomy (2008); Colonoscopy w/ polypectomy (2013); Tubal ligation; Tonsillectomy (2009); and Knee arthroscopy with anterior cruciate ligament (acl) repair (Right, 01/26/2013).   Her family history includes Arthritis in her mother; Breast cancer in her mother; CVA in her father and mother; Clotting disorder in her brother; Colon cancer (age of onset: 56) in her brother and maternal grandmother; Colon cancer (age of onset: 60) in her paternal uncle; Coronary  artery disease in her paternal aunt; Heart Problems in her mother; Heart attack in her maternal uncle; Hypertension in her mother, sister, and sister; Pancreatic cancer in her paternal uncle; Sleep apnea in her father and sister; Stomach cancer (age of onset: 81) in her maternal uncle.She reports that she quit smoking about 28 years ago. She has never used smokeless tobacco. She reports that she drinks about 2.4 oz of alcohol per week. She reports that she does not use illicit drugs.  Current Outpatient Prescriptions on File Prior to Visit  Medication Sig Dispense Refill  . albuterol (PROVENTIL HFA;VENTOLIN HFA) 108 (90 BASE) MCG/ACT inhaler Inhale 1-2 puffs into the lungs every 6 (six) hours as needed for wheezing or shortness of breath.    . ALPRAZolam (XANAX) 1 MG tablet Take 1 mg by mouth 4 (four) times daily as needed for anxiety.     Marland Kitchen amphetamine-dextroamphetamine (ADDERALL) 20 MG tablet Take 20 mg by mouth daily as needed (for concentration).     Marland Kitchen buPROPion (WELLBUTRIN XL) 150 MG 24 hr tablet Take 450 mg by mouth daily.     . celecoxib (CELEBREX) 50 MG capsule Take 50 mg by mouth 2 (two) times daily.    Marland Kitchen lamoTRIgine (LAMICTAL) 150 MG tablet Take 150 mg by mouth daily.    Marland Kitchen losartan (COZAAR) 100 MG tablet Take 1 tablet (100 mg total) by mouth daily. 30 tablet 0  . montelukast (SINGULAIR) 10 MG tablet TAKE ONE TABLET BY MOUTH DAILY 30 tablet 5  . oxyCODONE (OXY IR/ROXICODONE) 5 MG immediate release tablet Take 5 mg by mouth every 6 (six) hours as  needed (pain).     Jari Favre XR 7.5-325 MG TBCR Take 1 tablet by mouth 2 (two) times daily as needed (pain).   0   No current facility-administered medications on file prior to visit.     Objective:  Objective Physical Exam  Constitutional: She is oriented to person, place, and time. She appears well-developed and well-nourished.  HENT:  Head: Normocephalic and atraumatic.  Eyes: Conjunctivae and EOM are normal.  Neck: Normal range of  motion. Neck supple. No JVD present. Carotid bruit is not present. No thyromegaly present.  Cardiovascular: Normal rate, regular rhythm and normal heart sounds.   No murmur heard. Pulmonary/Chest: Effort normal and breath sounds normal. No respiratory distress. She has no wheezes. She has no rales. She exhibits no tenderness.  Musculoskeletal: She exhibits tenderness. She exhibits no edema.       Cervical back: She exhibits decreased range of motion, tenderness, pain and spasm.       Lumbar back: She exhibits tenderness, pain and spasm.  Neurological: She is alert and oriented to person, place, and time.  Psychiatric: She has a normal mood and affect.  Nursing note and vitals reviewed.  BP 159/111 mmHg  Pulse 97  Temp(Src) 97.9 F (36.6 C) (Oral)  Ht 5' 4"  (1.626 m)  Wt 163 lb (73.936 kg)  BMI 27.97 kg/m2  SpO2 98% Wt Readings from Last 3 Encounters:  09/04/15 163 lb (73.936 kg)  07/28/15 154 lb (69.854 kg)  05/12/15 160 lb (72.576 kg)     Lab Results  Component Value Date   WBC 7.9 12/23/2014   HGB 13.2 12/23/2014   HCT 38.7 12/23/2014   PLT 390 12/23/2014   GLUCOSE 88 09/04/2015   CHOL 196 09/04/2015   TRIG 156.0* 09/04/2015   HDL 67.20 09/04/2015   LDLCALC 97 09/04/2015   ALT 21 09/04/2015   AST 16 09/04/2015   NA 138 09/04/2015   K 4.8 09/04/2015   CL 98 09/04/2015   CREATININE 0.89 09/04/2015   BUN 12 09/04/2015   CO2 35* 09/04/2015   TSH 1.18 07/30/2012   INR 1.00 12/11/2014    Mr Pelvis W Wo Contrast  07/29/2015   CLINICAL DATA:  Initial encounter for frequent UTIs with difficulty voiding in worsening problems with complete bladder emptying. Evaluate for urethral diverticulum.  EXAM: MRI PELVIS WITHOUT AND WITH CONTRAST  TECHNIQUE: Multiplanar multisequence MR imaging of the pelvis was performed both before and after administration of intravenous contrast.  CONTRAST:  5m MULTIHANCE GADOBENATE DIMEGLUMINE 529 MG/ML IV SOLN  COMPARISON:  None.  FINDINGS:  Bladder is incompletely distended, but there is no evidence for bladder wall thickening or focal bladder wall abnormality. At the level of the mid urethra, there is a tiny 1-2 mm focus of T2 signal hyperintensity identified within the within the submucosal layer of the urethra (see image 27 series 4). Otherwise, the urethra has normal MR imaging features. There is no evidence of abnormal signal protruding into the periurethral fascia. Certainly, there is no classic U shaped or circumferential urethral diverticulum.  Uterus is surgically absent. Left ovary measures 2.9 x 1.6 x 4.2 cm and has normal MR imaging features. The right ovary is not clearly discriminated on this MR exam.  No pelvic sidewall lymphadenopathy. No intraperitoneal free fluid. No marrow signal abnormality within the visualized bony anatomy.  IMPRESSION: There is an extremely tiny focus of T2 hyperintensity within the submucosal layer of the mid urethra. This is likely a minimally distended urethral gland  although the [facility] glands are typically located more distally along the urethra. This could potentially represent the earliest manifestation of an evolving urethral diverticulum, but is not definite for such.   Electronically Signed   By: Misty Stanley M.D.   On: 07/29/2015 09:26     Assessment & Plan:  Plan I have discontinued Ms. Barraco's carvedilol. I have also changed her SUMAtriptan and spironolactone. Additionally, I am having her start on carvedilol. Lastly, I am having her maintain her montelukast, ALPRAZolam, lamoTRIgine, amphetamine-dextroamphetamine, oxyCODONE, buPROPion, albuterol, XARTEMIS XR, losartan, and celecoxib.  Meds ordered this encounter  Medications  . SUMAtriptan (IMITREX) 50 MG tablet    Sig: TAKE 1 TABLET BY MOUTH AS NEEDED    Dispense:  10 tablet    Refill:  1  . spironolactone (ALDACTONE) 25 MG tablet    Sig: Take 1 tablet (25 mg total) by mouth once.    Dispense:  90 tablet    Refill:  3  .  carvedilol (COREG) 12.5 MG tablet    Sig: Take 1 tablet (12.5 mg total) by mouth 2 (two) times daily with a meal.    Dispense:  180 tablet    Refill:  1    Problem List Items Addressed This Visit      Unprioritized   Essential hypertension - Primary    Inc the coreg 12.5 bid rto 2-3 weeks      Relevant Medications   spironolactone (ALDACTONE) 25 MG tablet   carvedilol (COREG) 12.5 MG tablet   Other Relevant Orders   Comp Met (CMET) (Completed)    Other Visit Diagnoses    Other type of migraine        Relevant Medications    SUMAtriptan (IMITREX) 50 MG tablet    spironolactone (ALDACTONE) 25 MG tablet    carvedilol (COREG) 12.5 MG tablet    Depression        Hyperlipidemia        Relevant Medications    spironolactone (ALDACTONE) 25 MG tablet    carvedilol (COREG) 12.5 MG tablet    Other Relevant Orders    Comp Met (CMET) (Completed)    Lipid panel (Completed)       Follow-up: Return in about 3 weeks (around 09/25/2015), or if symptoms worsen or fail to improve, for hypertension.  Garnet Koyanagi, DO

## 2015-09-04 NOTE — Assessment & Plan Note (Signed)
Inc the coreg 12.5 bid rto 2-3 weeks

## 2015-09-07 ENCOUNTER — Other Ambulatory Visit: Payer: Self-pay | Admitting: Family Medicine

## 2015-09-07 DIAGNOSIS — M62838 Other muscle spasm: Secondary | ICD-10-CM

## 2015-09-07 MED ORDER — CYCLOBENZAPRINE HCL 10 MG PO TABS: 10.0000 mg | ORAL_TABLET | Freq: Three times a day (TID) | ORAL | Status: DC | PRN

## 2015-09-07 NOTE — Telephone Encounter (Signed)
°  Relation to JW:TGRM  Call back number:[phone] Pharmacy:[facility] -[address]  Reason for call: pt states she was seen last Friday, states she is now having muscle spasms in her back and wanted to know if dr. Etter Sjogren can call her in a muscle relaxer states she and dr. Etter Sjogren discussed the pain when she was in for her visit.

## 2015-09-07 NOTE — Telephone Encounter (Signed)
Flexeril sent to pharmacy.

## 2015-09-08 NOTE — Telephone Encounter (Signed)
Pt.notified

## 2015-09-09 NOTE — Progress Notes (Signed)
Called pt for pre-op call, she states she has "postponed" this surgery. I called Dr. Rolena Infante' office to confirm and they said yes, pt has cancelled. Requested Dr. Rolena Infante' office to call OR to cancel pt here.

## 2015-09-10 ENCOUNTER — Inpatient Hospital Stay (HOSPITAL_COMMUNITY)
Admission: RE | Admit: 2015-09-10 | Payer: BLUE CROSS/BLUE SHIELD | Source: Ambulatory Visit | Admitting: Orthopedic Surgery

## 2015-09-10 ENCOUNTER — Encounter (HOSPITAL_COMMUNITY): Admission: RE | Payer: Self-pay | Source: Ambulatory Visit

## 2015-09-10 SURGERY — ANTERIOR CERVICAL DECOMPRESSION/DISCECTOMY FUSION 3 LEVELS
Anesthesia: General

## 2015-09-21 ENCOUNTER — Encounter (HOSPITAL_COMMUNITY): Admission: RE | Payer: Self-pay | Source: Ambulatory Visit

## 2015-09-21 ENCOUNTER — Ambulatory Visit (HOSPITAL_COMMUNITY)
Admission: RE | Admit: 2015-09-21 | Payer: BLUE CROSS/BLUE SHIELD | Source: Ambulatory Visit | Admitting: General Surgery

## 2015-09-21 ENCOUNTER — Encounter (HOSPITAL_COMMUNITY)
Admission: RE | Admit: 2015-09-21 | Discharge: 2015-09-21 | Disposition: A | Discharge status: Home / Self Care | Payer: BLUE CROSS/BLUE SHIELD | Source: Ambulatory Visit | Attending: Orthopedic Surgery | Admitting: Orthopedic Surgery

## 2015-09-21 ENCOUNTER — Encounter (HOSPITAL_COMMUNITY): Payer: Self-pay

## 2015-09-21 DIAGNOSIS — I10 Essential (primary) hypertension: Secondary | ICD-10-CM | POA: Diagnosis not present

## 2015-09-21 DIAGNOSIS — J45909 Unspecified asthma, uncomplicated: Secondary | ICD-10-CM | POA: Diagnosis not present

## 2015-09-21 DIAGNOSIS — Z87891 Personal history of nicotine dependence: Secondary | ICD-10-CM | POA: Diagnosis not present

## 2015-09-21 DIAGNOSIS — F329 Major depressive disorder, single episode, unspecified: Secondary | ICD-10-CM | POA: Diagnosis not present

## 2015-09-21 DIAGNOSIS — Z79899 Other long term (current) drug therapy: Secondary | ICD-10-CM | POA: Diagnosis not present

## 2015-09-21 DIAGNOSIS — Z79891 Long term (current) use of opiate analgesic: Secondary | ICD-10-CM | POA: Diagnosis not present

## 2015-09-21 DIAGNOSIS — M4722 Other spondylosis with radiculopathy, cervical region: Secondary | ICD-10-CM | POA: Diagnosis not present

## 2015-09-21 DIAGNOSIS — F419 Anxiety disorder, unspecified: Secondary | ICD-10-CM | POA: Diagnosis not present

## 2015-09-21 HISTORY — DX: Other specified postprocedural states: Z98.890

## 2015-09-21 HISTORY — DX: Unspecified osteoarthritis, unspecified site: M19.90

## 2015-09-21 HISTORY — DX: Other specified postprocedural states: R11.2

## 2015-09-21 HISTORY — DX: Other reaction to spinal and lumbar puncture: G97.1

## 2015-09-21 HISTORY — DX: Bipolar disorder, unspecified: F31.9

## 2015-09-21 HISTORY — DX: Pneumonia, unspecified organism: J18.9

## 2015-09-21 LAB — BASIC METABOLIC PANEL
ANION GAP: 10 (ref 5–15)
BUN: 11 mg/dL (ref 6–20)
CHLORIDE: 101 mmol/L (ref 101–111)
CO2: 29 mmol/L (ref 22–32)
Calcium: 9.6 mg/dL (ref 8.9–10.3)
Creatinine, Ser: 0.92 mg/dL (ref 0.44–1.00)
GFR calc Af Amer: >60 mL/min (ref >60)
GLUCOSE: 94 mg/dL (ref 65–99)
POTASSIUM: 4.1 mmol/L (ref 3.5–5.1)
SODIUM: 140 mmol/L (ref 135–145)

## 2015-09-21 LAB — CBC
HCT: 42.7 % (ref 36.0–46.0)
HEMOGLOBIN: 14.3 g/dL (ref 12.0–15.0)
MCH: 31 pg (ref 26.0–34.0)
MCHC: 33.5 g/dL (ref 30.0–36.0)
MCV: 92.4 fL (ref 78.0–100.0)
PLATELETS: 298 10*3/uL (ref 150–400)
RBC: 4.62 MIL/uL (ref 3.87–5.11)
RDW: 12.8 % (ref 11.5–15.5)
WBC: 8.7 10*3/uL (ref 4.0–10.5)

## 2015-09-21 LAB — SURGICAL PCR SCREEN
MRSA, PCR: NEGATIVE
Staphylococcus aureus: NEGATIVE

## 2015-09-21 SURGERY — MANOMETRY, ANORECTAL

## 2015-09-21 NOTE — Progress Notes (Signed)
Requested heart cath, stress  Test, ov from  Dr. Harrington Challenger.

## 2015-09-21 NOTE — Pre-Procedure Instructions (Signed)
Tammy Randolph  09/21/2015      [id number] - [place], [address] AT [facility] [address] [place] [address] Phone: [phone] Fax: [phone]    Your procedure is scheduled on [facility] 09/23/2015  Report to [name] Admitting at 0900 A.M.  Call this number if you have problems the morning of surgery:  [phone]   Remember:  Do not eat food or drink liquids after midnight.  Take these medicines the morning of surgery with A SIP OF WATER  ALBUTEROL INHALER, ALPRAZOLAM, WELLBUTRIN, COREG, OXYCODONE,   Do not wear jewelry, make-up or nail polish.  Do not wear lotions, powders, or perfumes.  You may wear deodorant.  Do not shave 48 hours prior to surgery.  Men may shave face and neck.  Do not bring valuables to the hospital.  [facility] is not responsible for any belongings or valuables.  Contacts, dentures or bridgework may not be worn into surgery.  Leave your suitcase in the car.  After surgery it may be brought to your room.  For patients admitted to the hospital, discharge time will be determined by your treatment team.  Patients discharged the day of surgery will not be allowed to drive home.   Name and phone number of your driver:   TBA Special instructions:  [facility] - Preparing for Surgery  Before surgery, you can play an important role.  Because skin is not sterile, your skin needs to be as free of germs as possible.  You can reduce the number of germs on you skin by washing with CHG (chlorahexidine gluconate) soap before surgery.  CHG is an antiseptic cleaner which kills germs and bonds with the skin to continue killing germs even after washing.  Please DO NOT use if you have an allergy to CHG or antibacterial soaps.  If your skin becomes reddened/irritated stop using the CHG and inform your nurse when you arrive at Short Stay.  Do not shave (including legs and underarms) for at least 48  hours prior to the first CHG shower.  You may shave your face.  Please follow these instructions carefully:   1.  Shower with CHG Soap the night before surgery and the                                morning of Surgery.  2.  If you choose to wash your hair, wash your hair first as usual with your       normal shampoo.  3.  After you shampoo, rinse your hair and body thoroughly to remove the                      Shampoo.  4.  Use CHG as you would any other liquid soap.  You can apply chg directly       to the skin and wash gently with scrungie or a clean washcloth.  5.  Apply the CHG Soap to your body ONLY FROM THE NECK DOWN.        Do not use on open wounds or open sores.  Avoid contact with your eyes,       ears, mouth and genitals (private parts).  Wash genitals (private parts)       with your normal soap.  6.  Wash thoroughly, paying special attention  to the area where your surgery        will be performed.  7.  Thoroughly rinse your body with warm water from the neck down.  8.  DO NOT shower/wash with your normal soap after using and rinsing off       the CHG Soap.  9.  Pat yourself dry with a clean towel.            10.  Wear clean pajamas.            11.  Place clean sheets on your bed the night of your first shower and do not        sleep with pets.  Day of Surgery  Do not apply any lotions/deoderants the morning of surgery.  Please wear clean clothes to the hospital/surgery center.    Please read over the following fact sheets that you were given. Pain Booklet, Coughing and Deep Breathing, MRSA Information and Surgical Site Infection Prevention

## 2015-09-22 MED ORDER — DEXAMETHASONE SODIUM PHOSPHATE 4 MG/ML IJ SOLN
8.0000 mg | INTRAMUSCULAR | Status: DC
  Filled 2015-09-22 (×2): qty 2

## 2015-09-22 MED ORDER — SODIUM CHLORIDE 0.9 % IV SOLN
1500.0000 mg | INTRAVENOUS | Status: DC
  Filled 2015-09-22: qty 1500

## 2015-09-22 MED ORDER — VANCOMYCIN HCL 10 G IV SOLR
1500.0000 mg | INTRAVENOUS | Status: DC
  Filled 2015-09-22: qty 1500

## 2015-09-22 NOTE — Progress Notes (Signed)
Dr Dorris Carnes' office called and re-requested heart cath, stress test and OV note.  They state they will fax today.

## 2015-09-23 ENCOUNTER — Inpatient Hospital Stay (HOSPITAL_COMMUNITY): Payer: BLUE CROSS/BLUE SHIELD

## 2015-09-23 ENCOUNTER — Inpatient Hospital Stay (HOSPITAL_COMMUNITY): Payer: BLUE CROSS/BLUE SHIELD | Admitting: Anesthesiology

## 2015-09-23 ENCOUNTER — Observation Stay (HOSPITAL_COMMUNITY)
Admission: RE | Admit: 2015-09-23 | Discharge: 2015-09-24 | Disposition: A | Discharge status: Home / Self Care | Payer: BLUE CROSS/BLUE SHIELD | Source: Ambulatory Visit | Attending: Orthopedic Surgery | Admitting: Orthopedic Surgery

## 2015-09-23 ENCOUNTER — Encounter (HOSPITAL_COMMUNITY): Payer: Self-pay | Admitting: *Deleted

## 2015-09-23 ENCOUNTER — Encounter (HOSPITAL_COMMUNITY)
Admission: RE | Disposition: A | Discharge status: Home / Self Care | Payer: Self-pay | Source: Ambulatory Visit | Attending: Orthopedic Surgery

## 2015-09-23 ENCOUNTER — Observation Stay (HOSPITAL_COMMUNITY): Payer: BLUE CROSS/BLUE SHIELD

## 2015-09-23 DIAGNOSIS — M542 Cervicalgia: Secondary | ICD-10-CM | POA: Diagnosis present

## 2015-09-23 DIAGNOSIS — F329 Major depressive disorder, single episode, unspecified: Secondary | ICD-10-CM | POA: Insufficient documentation

## 2015-09-23 DIAGNOSIS — Z419 Encounter for procedure for purposes other than remedying health state, unspecified: Secondary | ICD-10-CM

## 2015-09-23 DIAGNOSIS — Z79899 Other long term (current) drug therapy: Secondary | ICD-10-CM | POA: Insufficient documentation

## 2015-09-23 DIAGNOSIS — J45909 Unspecified asthma, uncomplicated: Secondary | ICD-10-CM | POA: Insufficient documentation

## 2015-09-23 DIAGNOSIS — F419 Anxiety disorder, unspecified: Secondary | ICD-10-CM | POA: Insufficient documentation

## 2015-09-23 DIAGNOSIS — M4722 Other spondylosis with radiculopathy, cervical region: Principal | ICD-10-CM | POA: Insufficient documentation

## 2015-09-23 DIAGNOSIS — Z79891 Long term (current) use of opiate analgesic: Secondary | ICD-10-CM | POA: Insufficient documentation

## 2015-09-23 DIAGNOSIS — I10 Essential (primary) hypertension: Secondary | ICD-10-CM | POA: Insufficient documentation

## 2015-09-23 DIAGNOSIS — M4322 Fusion of spine, cervical region: Secondary | ICD-10-CM

## 2015-09-23 DIAGNOSIS — Z87891 Personal history of nicotine dependence: Secondary | ICD-10-CM | POA: Insufficient documentation

## 2015-09-23 HISTORY — PX: ANTERIOR CERVICAL DECOMP/DISCECTOMY FUSION: SHX1161

## 2015-09-23 SURGERY — ANTERIOR CERVICAL DECOMPRESSION/DISCECTOMY FUSION 3 LEVELS
Anesthesia: General

## 2015-09-23 MED ORDER — METHOCARBAMOL 1000 MG/10ML IJ SOLN
500.0000 mg | Freq: Four times a day (QID) | INTRAVENOUS | Status: DC | PRN
  Filled 2015-09-23: qty 5

## 2015-09-23 MED ORDER — FENTANYL CITRATE (PF) 250 MCG/5ML IJ SOLN
INTRAMUSCULAR | Status: AC
  Filled 2015-09-23: qty 5

## 2015-09-23 MED ORDER — LACTATED RINGERS IV SOLN
INTRAVENOUS | Status: DC
  Administered 2015-09-23 (×3): via INTRAVENOUS

## 2015-09-23 MED ORDER — AMPHETAMINE-DEXTROAMPHET ER 20 MG PO CP24: 20.0000 mg | ORAL_CAPSULE | Freq: Every day | ORAL | Status: DC | PRN

## 2015-09-23 MED ORDER — METHOCARBAMOL 500 MG PO TABS: 500.0000 mg | ORAL_TABLET | Freq: Three times a day (TID) | ORAL | Status: DC | PRN

## 2015-09-23 MED ORDER — THROMBIN 20000 UNITS EX SOLR
CUTANEOUS | Status: AC
  Filled 2015-09-23: qty 40000

## 2015-09-23 MED ORDER — OXYCODONE HCL 5 MG PO TABS
ORAL_TABLET | ORAL | Status: AC
  Filled 2015-09-23: qty 3

## 2015-09-23 MED ORDER — METHOCARBAMOL 500 MG PO TABS
500.0000 mg | ORAL_TABLET | Freq: Four times a day (QID) | ORAL | Status: DC | PRN
  Administered 2015-09-23 (×2): 500 mg via ORAL
  Filled 2015-09-23: qty 1

## 2015-09-23 MED ORDER — HYDROMORPHONE HCL 1 MG/ML IJ SOLN
INTRAMUSCULAR | Status: AC
  Filled 2015-09-23: qty 1

## 2015-09-23 MED ORDER — LIDOCAINE HCL (CARDIAC) 20 MG/ML IV SOLN
INTRAVENOUS | Status: DC | PRN
  Administered 2015-09-23: 60 mg via INTRAVENOUS

## 2015-09-23 MED ORDER — VANCOMYCIN HCL IN DEXTROSE 1-5 GM/200ML-% IV SOLN
1000.0000 mg | Freq: Once | INTRAVENOUS | Status: AC
  Administered 2015-09-24: 1000 mg via INTRAVENOUS
  Filled 2015-09-23: qty 200

## 2015-09-23 MED ORDER — VANCOMYCIN HCL 1000 MG IV SOLR
1000.0000 mg | INTRAVENOUS | Status: DC | PRN
  Administered 2015-09-23: 1500 mg via INTRAVENOUS

## 2015-09-23 MED ORDER — SPIRONOLACTONE 25 MG PO TABS
25.0000 mg | ORAL_TABLET | Freq: Once | ORAL | Status: AC
  Administered 2015-09-23: 25 mg via ORAL
  Filled 2015-09-23: qty 1

## 2015-09-23 MED ORDER — MONTELUKAST SODIUM 10 MG PO TABS
10.0000 mg | ORAL_TABLET | Freq: Every day | ORAL | Status: DC
  Filled 2015-09-23 (×2): qty 1

## 2015-09-23 MED ORDER — THROMBIN 20000 UNITS EX SOLR
CUTANEOUS | Status: DC | PRN
  Administered 2015-09-23: 12:00:00 via TOPICAL

## 2015-09-23 MED ORDER — GLYCOPYRROLATE 0.2 MG/ML IJ SOLN
INTRAMUSCULAR | Status: DC | PRN
  Administered 2015-09-23: .4 mg via INTRAVENOUS

## 2015-09-23 MED ORDER — CARVEDILOL 6.25 MG PO TABS
12.5000 mg | ORAL_TABLET | Freq: Two times a day (BID) | ORAL | Status: DC
  Administered 2015-09-23: 12.5 mg via ORAL
  Filled 2015-09-23: qty 2

## 2015-09-23 MED ORDER — PROMETHAZINE HCL 25 MG/ML IJ SOLN: 6.2500 mg | INTRAMUSCULAR | Status: DC | PRN

## 2015-09-23 MED ORDER — MENTHOL 3 MG MT LOZG: 1.0000 | LOZENGE | OROMUCOSAL | Status: DC | PRN

## 2015-09-23 MED ORDER — ACETAMINOPHEN 10 MG/ML IV SOLN
INTRAVENOUS | Status: DC | PRN
  Administered 2015-09-23: 1000 mg via INTRAVENOUS

## 2015-09-23 MED ORDER — ARTIFICIAL TEARS OP OINT
TOPICAL_OINTMENT | OPHTHALMIC | Status: DC | PRN
  Administered 2015-09-23: 1 via OPHTHALMIC

## 2015-09-23 MED ORDER — ROCURONIUM BROMIDE 100 MG/10ML IV SOLN
INTRAVENOUS | Status: DC | PRN
  Administered 2015-09-23: 50 mg via INTRAVENOUS

## 2015-09-23 MED ORDER — ACETAMINOPHEN 10 MG/ML IV SOLN
INTRAVENOUS | Status: AC
  Filled 2015-09-23: qty 100

## 2015-09-23 MED ORDER — ACETAMINOPHEN 10 MG/ML IV SOLN
1000.0000 mg | Freq: Four times a day (QID) | INTRAVENOUS | Status: DC
  Administered 2015-09-23 – 2015-09-24 (×3): 1000 mg via INTRAVENOUS
  Filled 2015-09-23 (×3): qty 100

## 2015-09-23 MED ORDER — DEXAMETHASONE SODIUM PHOSPHATE 4 MG/ML IJ SOLN
INTRAMUSCULAR | Status: AC
  Filled 2015-09-23: qty 1

## 2015-09-23 MED ORDER — PHENOL 1.4 % MT LIQD: 1.0000 | OROMUCOSAL | Status: DC | PRN

## 2015-09-23 MED ORDER — DEXAMETHASONE SODIUM PHOSPHATE 4 MG/ML IJ SOLN
INTRAMUSCULAR | Status: DC | PRN
  Administered 2015-09-23: 10 mg via INTRAVENOUS

## 2015-09-23 MED ORDER — DEXAMETHASONE 4 MG PO TABS
4.0000 mg | ORAL_TABLET | Freq: Four times a day (QID) | ORAL | Status: DC
  Administered 2015-09-23 – 2015-09-24 (×3): 4 mg via ORAL
  Filled 2015-09-23 (×3): qty 1

## 2015-09-23 MED ORDER — OXYCODONE-ACETAMINOPHEN 10-325 MG PO TABS: 1.0000 | ORAL_TABLET | ORAL | Status: DC | PRN

## 2015-09-23 MED ORDER — SODIUM CHLORIDE 0.9 % IV SOLN
10.0000 mg | INTRAVENOUS | Status: DC | PRN
  Administered 2015-09-23: 10 ug/min via INTRAVENOUS

## 2015-09-23 MED ORDER — BUPROPION HCL ER (XL) 300 MG PO TB24
450.0000 mg | ORAL_TABLET | Freq: Every day | ORAL | Status: DC
  Filled 2015-09-23 (×2): qty 1

## 2015-09-23 MED ORDER — HEMOSTATIC AGENTS (NO CHARGE) OPTIME
TOPICAL | Status: DC | PRN
  Administered 2015-09-23 (×2): 1 via TOPICAL

## 2015-09-23 MED ORDER — BUPIVACAINE-EPINEPHRINE 0.25% -1:200000 IJ SOLN
INTRAMUSCULAR | Status: DC | PRN
  Administered 2015-09-23: 6 mg

## 2015-09-23 MED ORDER — ALBUTEROL SULFATE (2.5 MG/3ML) 0.083% IN NEBU: 3.0000 mL | INHALATION_SOLUTION | Freq: Four times a day (QID) | RESPIRATORY_TRACT | Status: DC | PRN

## 2015-09-23 MED ORDER — VECURONIUM BROMIDE 10 MG IV SOLR
INTRAVENOUS | Status: DC | PRN
  Administered 2015-09-23: 3 mg via INTRAVENOUS

## 2015-09-23 MED ORDER — FENTANYL CITRATE (PF) 100 MCG/2ML IJ SOLN
INTRAMUSCULAR | Status: DC | PRN
  Administered 2015-09-23: 150 ug via INTRAVENOUS
  Administered 2015-09-23: 50 ug via INTRAVENOUS
  Administered 2015-09-23: 100 ug via INTRAVENOUS
  Administered 2015-09-23 (×4): 50 ug via INTRAVENOUS

## 2015-09-23 MED ORDER — OXYCODONE HCL 5 MG/5ML PO SOLN: 5.0000 mg | Freq: Once | ORAL | Status: DC | PRN

## 2015-09-23 MED ORDER — HYDROMORPHONE HCL 1 MG/ML IJ SOLN
INTRAMUSCULAR | Status: DC | PRN
  Administered 2015-09-23: 1 mg via INTRAVENOUS

## 2015-09-23 MED ORDER — PROPOFOL 10 MG/ML IV BOLUS
INTRAVENOUS | Status: DC | PRN
  Administered 2015-09-23: 200 mg via INTRAVENOUS

## 2015-09-23 MED ORDER — MIDAZOLAM HCL 2 MG/2ML IJ SOLN
INTRAMUSCULAR | Status: AC
  Filled 2015-09-23: qty 4

## 2015-09-23 MED ORDER — HYDROMORPHONE HCL 1 MG/ML IJ SOLN
0.2500 mg | INTRAMUSCULAR | Status: DC | PRN
  Administered 2015-09-23 (×2): 1 mg via INTRAVENOUS

## 2015-09-23 MED ORDER — HYDROMORPHONE HCL 1 MG/ML IJ SOLN
1.0000 mg | INTRAMUSCULAR | Status: DC | PRN
  Administered 2015-09-23 – 2015-09-24 (×3): 1 mg via INTRAVENOUS
  Filled 2015-09-23 (×2): qty 1

## 2015-09-23 MED ORDER — ALPRAZOLAM 0.5 MG PO TABS: 1.0000 mg | ORAL_TABLET | Freq: Four times a day (QID) | ORAL | Status: DC | PRN

## 2015-09-23 MED ORDER — ONDANSETRON HCL 4 MG/2ML IJ SOLN
INTRAMUSCULAR | Status: DC | PRN
  Administered 2015-09-23 (×2): 4 mg via INTRAVENOUS

## 2015-09-23 MED ORDER — SODIUM CHLORIDE 0.9 % IJ SOLN: 3.0000 mL | Freq: Two times a day (BID) | INTRAMUSCULAR | Status: DC

## 2015-09-23 MED ORDER — BUPIVACAINE-EPINEPHRINE (PF) 0.25% -1:200000 IJ SOLN
INTRAMUSCULAR | Status: AC
  Filled 2015-09-23: qty 30

## 2015-09-23 MED ORDER — LAMOTRIGINE 150 MG PO TABS
300.0000 mg | ORAL_TABLET | Freq: Every day | ORAL | Status: DC
  Administered 2015-09-23: 300 mg via ORAL
  Filled 2015-09-23 (×2): qty 2

## 2015-09-23 MED ORDER — SODIUM CHLORIDE 0.9 % IJ SOLN: 3.0000 mL | INTRAMUSCULAR | Status: DC | PRN

## 2015-09-23 MED ORDER — 0.9 % SODIUM CHLORIDE (POUR BTL) OPTIME
TOPICAL | Status: DC | PRN
  Administered 2015-09-23 (×2): 1000 mL

## 2015-09-23 MED ORDER — OXYCODONE HCL 5 MG PO TABS: 5.0000 mg | ORAL_TABLET | Freq: Once | ORAL | Status: DC | PRN

## 2015-09-23 MED ORDER — ONDANSETRON HCL 4 MG/2ML IJ SOLN
4.0000 mg | INTRAMUSCULAR | Status: DC | PRN
  Administered 2015-09-23 (×2): 4 mg via INTRAVENOUS
  Filled 2015-09-23 (×2): qty 2

## 2015-09-23 MED ORDER — METHOCARBAMOL 500 MG PO TABS
ORAL_TABLET | ORAL | Status: AC
  Filled 2015-09-23: qty 1

## 2015-09-23 MED ORDER — LACTATED RINGERS IV SOLN: INTRAVENOUS | Status: DC

## 2015-09-23 MED ORDER — DEXAMETHASONE SODIUM PHOSPHATE 4 MG/ML IJ SOLN: 4.0000 mg | Freq: Four times a day (QID) | INTRAMUSCULAR | Status: DC

## 2015-09-23 MED ORDER — SODIUM CHLORIDE 0.9 % IV SOLN
INTRAVENOUS | Status: DC | PRN
  Administered 2015-09-23: 11:00:00 via INTRAVENOUS

## 2015-09-23 MED ORDER — OXYCODONE HCL 5 MG PO TABS
10.0000 mg | ORAL_TABLET | ORAL | Status: DC | PRN
  Administered 2015-09-23 – 2015-09-24 (×2): 10 mg via ORAL
  Filled 2015-09-23 (×2): qty 2

## 2015-09-23 MED ORDER — ONDANSETRON HCL 4 MG PO TABS: 4.0000 mg | ORAL_TABLET | Freq: Three times a day (TID) | ORAL | Status: AC | PRN

## 2015-09-23 MED ORDER — NEOSTIGMINE METHYLSULFATE 10 MG/10ML IV SOLN
INTRAVENOUS | Status: DC | PRN
  Administered 2015-09-23: 2.5 mg via INTRAVENOUS

## 2015-09-23 MED ORDER — MIDAZOLAM HCL 5 MG/5ML IJ SOLN
INTRAMUSCULAR | Status: DC | PRN
  Administered 2015-09-23: 2 mg via INTRAVENOUS

## 2015-09-23 SURGICAL SUPPLY — 74 items
BLADE SURG ROTATE 9660 (MISCELLANEOUS) IMPLANT
BUR EGG ELITE 4.0 (BURR) IMPLANT
BUR MATCHSTICK NEURO 3.0 LAGG (BURR) IMPLANT
CANISTER SUCTION 2500CC (MISCELLANEOUS) ×2 IMPLANT
CLSR STERI-STRIP ANTIMIC 1/2X4 (GAUZE/BANDAGES/DRESSINGS) ×2 IMPLANT
CORDS BIPOLAR (ELECTRODE) ×2 IMPLANT
COVER SURGICAL LIGHT HANDLE (MISCELLANEOUS) ×4 IMPLANT
CRADLE DONUT ADULT HEAD (MISCELLANEOUS) ×2 IMPLANT
DRAPE C-ARM 42X72 X-RAY (DRAPES) ×2 IMPLANT
DRAPE POUCH INSTRU U-SHP 10X18 (DRAPES) ×2 IMPLANT
DRAPE SURG 17X23 STRL (DRAPES) ×2 IMPLANT
DRAPE U-SHAPE 47X51 STRL (DRAPES) ×2 IMPLANT
DRILL BIT SWIFT PLUS 12MM (BIT) ×1 IMPLANT
DRSG MEPILEX BORDER 4X8 (GAUZE/BANDAGES/DRESSINGS) ×2 IMPLANT
DURAPREP 26ML APPLICATOR (WOUND CARE) ×2 IMPLANT
ELECT COATED BLADE 2.86 ST (ELECTRODE) ×2 IMPLANT
ELECT PENCIL ROCKER SW 15FT (MISCELLANEOUS) ×2 IMPLANT
ELECT REM PT RETURN 9FT ADLT (ELECTROSURGICAL) ×2
ELECTRODE REM PT RTRN 9FT ADLT (ELECTROSURGICAL) ×1 IMPLANT
GLOVE BIO SURGEON STRL SZ 6.5 (GLOVE) ×1 IMPLANT
GLOVE BIOGEL PI IND STRL 6.5 (GLOVE) IMPLANT
GLOVE BIOGEL PI IND STRL 8 (GLOVE) ×1 IMPLANT
GLOVE BIOGEL PI IND STRL 8.5 (GLOVE) ×1 IMPLANT
GLOVE BIOGEL PI INDICATOR 6.5 (GLOVE) ×1
GLOVE BIOGEL PI INDICATOR 8 (GLOVE) ×1
GLOVE BIOGEL PI INDICATOR 8.5 (GLOVE) ×1
GLOVE ORTHO TXT STRL SZ7.5 (GLOVE) ×2 IMPLANT
GLOVE SS BIOGEL STRL SZ 8.5 (GLOVE) ×1 IMPLANT
GLOVE SUPERSENSE BIOGEL SZ 8.5 (GLOVE) ×1
GOWN STRL REUS W/ TWL LRG LVL3 (GOWN DISPOSABLE) IMPLANT
GOWN STRL REUS W/ TWL XL LVL3 (GOWN DISPOSABLE) ×2 IMPLANT
GOWN STRL REUS W/TWL 2XL LVL3 (GOWN DISPOSABLE) ×4 IMPLANT
GOWN STRL REUS W/TWL LRG LVL3 (GOWN DISPOSABLE) ×8
GOWN STRL REUS W/TWL XL LVL3 (GOWN DISPOSABLE) ×4
INTERLOCK LRDTC CRVCL VBR 6MM (Bone Implant) IMPLANT
INTERLOCK LRDTC CRVCL VBR 7MM (Bone Implant) IMPLANT
KIT BASIN OR (CUSTOM PROCEDURE TRAY) ×2 IMPLANT
KIT ROOM TURNOVER OR (KITS) ×2 IMPLANT
LORDOTIC CERVICAL VBR 6MM SM (Bone Implant) ×2 IMPLANT
LORDOTIC CERVICAL VBR 7MM SM (Bone Implant) ×4 IMPLANT
NDL SPNL 18GX3.5 QUINCKE PK (NEEDLE) ×1 IMPLANT
NEEDLE SPNL 18GX3.5 QUINCKE PK (NEEDLE) ×2 IMPLANT
NS IRRIG 1000ML POUR BTL (IV SOLUTION) ×2 IMPLANT
PACK ORTHO CERVICAL (CUSTOM PROCEDURE TRAY) ×2 IMPLANT
PACK UNIVERSAL I (CUSTOM PROCEDURE TRAY) ×2 IMPLANT
PAD ARMBOARD 7.5X6 YLW CONV (MISCELLANEOUS) ×6 IMPLANT
PATTIES SURGICAL .25X.25 (GAUZE/BANDAGES/DRESSINGS) ×1 IMPLANT
PATTIES SURGICAL .5 X.5 (GAUZE/BANDAGES/DRESSINGS) IMPLANT
PATTIES SURGICAL .5 X1 (DISPOSABLE) ×1 IMPLANT
PIN DISTRACTION 14 (PIN) ×1 IMPLANT
PIN RETAINER PRODISC 14 MM (PIN) ×2 IMPLANT
PLATE SWIFT 3LVL 48MM (Plate) ×1 IMPLANT
PUTTY BONE DBX 5CC MIX (Putty) ×1 IMPLANT
RESTRAINT LIMB HOLDER UNIV (RESTRAINTS) ×2 IMPLANT
SCREW SD-VA 14M SWIFT PLUS (Screw) ×4 IMPLANT
SCREW SWIFT SD-VA 12MM (Screw) ×2 IMPLANT
SPONGE INTESTINAL PEANUT (DISPOSABLE) ×4 IMPLANT
SPONGE LAP 4X18 X RAY DECT (DISPOSABLE) ×4 IMPLANT
SPONGE SURGIFOAM ABS GEL 100 (HEMOSTASIS) ×2 IMPLANT
SURGIFLO W/THROMBIN 8M KIT (HEMOSTASIS) ×2 IMPLANT
SUT BONE WAX W31G (SUTURE) ×2 IMPLANT
SUT MON AB 3-0 SH 27 (SUTURE) ×2
SUT MON AB 3-0 SH27 (SUTURE) ×1 IMPLANT
SUT SILK 2 0 (SUTURE) ×2
SUT SILK 2-0 18XBRD TIE 12 (SUTURE) ×1 IMPLANT
SUT VIC AB 2-0 CT1 18 (SUTURE) ×2 IMPLANT
SYR BULB IRRIGATION 50ML (SYRINGE) ×2 IMPLANT
SYR CONTROL 10ML LL (SYRINGE) ×2 IMPLANT
TAPE CLOTH 4X10 WHT NS (GAUZE/BANDAGES/DRESSINGS) ×2 IMPLANT
TAPE UMBILICAL COTTON 1/8X30 (MISCELLANEOUS) ×2 IMPLANT
TOWEL OR 17X24 6PK STRL BLUE (TOWEL DISPOSABLE) ×2 IMPLANT
TOWEL OR 17X26 10 PK STRL BLUE (TOWEL DISPOSABLE) ×2 IMPLANT
TRAY FOLEY CATH 16FRSI W/METER (SET/KITS/TRAYS/PACK) ×2 IMPLANT
WATER STERILE IRR 1000ML POUR (IV SOLUTION) ×2 IMPLANT

## 2015-09-23 NOTE — Plan of Care (Signed)
Problem: Consults Goal: Diagnosis - Spinal Surgery Outcome: Completed/Met Date Met:  09/23/15 Cervical Spine Fusion

## 2015-09-23 NOTE — Transfer of Care (Signed)
Immediate Anesthesia Transfer of Care Note  Patient: Tammy Randolph  Procedure(s) Performed: Procedure(s): ANTERIOR CERVICAL DISCECTOMY FUSION C4-7     (3 LEVELS) (N/A)  Patient Location: PACU  Anesthesia Type:General  Level of Consciousness: awake, oriented and sedated  Airway & Oxygen Therapy: Patient Spontanous Breathing and Patient connected to face mask oxygen  Post-op Assessment: Report given to RN, Post -op Vital signs reviewed and stable and Patient moving all extremities X 4  Post vital signs: Reviewed and stable  Last Vitals:  Filed Vitals:   09/23/15 0927  BP: 157/99  Pulse: 82  Temp: 36.4 C  Resp: 18    Complications: No apparent anesthesia complications

## 2015-09-23 NOTE — Progress Notes (Signed)
ANTIBIOTIC CONSULT NOTE - INITIAL  Pharmacy Consult for Vancomycin Indication: surgical prophylaxis  Allergies  Allergen Reactions  . Hydrocodone-Acetaminophen Itching  . Morphine Other (See Comments)    Pt did not like the way it made her feel    Patient Measurements: Height: 5\' 4"  (162.6 cm) Weight: 163 lb (73.936 kg) IBW/kg (Calculated) : 54.7 Adjusted Body Weight:   Vital Signs: Temp: 98 F (36.7 C) (09/28 1645) Temp Source: Oral (09/28 0927) BP: 138/93 mmHg (09/28 1645) Pulse Rate: 97 (09/28 1645) Intake/Output from previous day:   Intake/Output from this shift: Total I/O In: 3000 [I.V.:3000] Out: 1000 [Urine:750; Blood:250]  Labs:  Recent Labs  09/21/15 1134  WBC 8.7  HGB 14.3  PLT 298  CREATININE 0.92   Estimated Creatinine Clearance: 71.3 mL/min (by C-G formula based on Cr of 0.92). No results for input(s): VANCOTROUGH, VANCOPEAK, VANCORANDOM, GENTTROUGH, GENTPEAK, GENTRANDOM, TOBRATROUGH, TOBRAPEAK, TOBRARND, AMIKACINPEAK, AMIKACINTROU, AMIKACIN in the last 72 hours.   Microbiology: Recent Results (from the past 720 hour(s))  Surgical pcr screen     Status: None   Collection Time: 09/21/15 11:34 AM  Result Value Ref Range Status   MRSA, PCR NEGATIVE NEGATIVE Final   Staphylococcus aureus NEGATIVE NEGATIVE Final    Comment:        The Xpert SA Assay (FDA approved for NASAL specimens in patients over 50 years of age), is one component of a comprehensive surveillance program.  Test performance has been validated by [facility] for patients greater than or equal to 106 year old. It is not intended to diagnose infection nor to guide or monitor treatment.     Medical History: Past Medical History  Diagnosis Date  . Seasonal allergies   . Anxiety   . History of blood transfusion 2003  . Asthma   . Hypertension   . Depression   . Chronic fatigue   . PONV (postoperative nausea and vomiting)   . Spinal headache   . Pneumonia     couple of  times last being last year  . Bipolar disorder   . Arthritis     Medications:  Prescriptions prior to admission  Medication Sig Dispense Refill Last Dose  . ALPRAZolam (XANAX) 1 MG tablet Take 1 mg by mouth 4 (four) times daily as needed for anxiety.    09/23/2015 at 0800  . amphetamine-dextroamphetamine (ADDERALL XR) 20 MG 24 hr capsule Take 20 mg by mouth daily as needed (concentration).   Past Week at Unknown time  . buPROPion (WELLBUTRIN XL) 150 MG 24 hr tablet Take 450 mg by mouth daily.    09/22/2015 at Unknown time  . carvedilol (COREG) 12.5 MG tablet Take 1 tablet (12.5 mg total) by mouth 2 (two) times daily with a meal. 180 tablet 1 09/23/2015 at 0800  . cyclobenzaprine (FLEXERIL) 10 MG tablet Take 1 tablet (10 mg total) by mouth 3 (three) times daily as needed for muscle spasms. 30 tablet 0 Past Month at Unknown time  . lamoTRIgine (LAMICTAL) 150 MG tablet Take 300 mg by mouth at bedtime.    09/22/2015 at Unknown time  . montelukast (SINGULAIR) 10 MG tablet TAKE ONE TABLET BY MOUTH DAILY 30 tablet 5 Past Week at Unknown time  . oxyCODONE (OXY IR/ROXICODONE) 5 MG immediate release tablet Take 5 mg by mouth every 6 (six) hours as needed (pain).    09/22/2015 at Unknown time  . pseudoephedrine-acetaminophen (TYLENOL SINUS) 30-500 MG TABS Take 1 tablet by mouth every 4 (four) hours as  needed (Seasonal Allergies).   09/22/2015 at Unknown time  . spironolactone (ALDACTONE) 25 MG tablet Take 1 tablet (25 mg total) by mouth once. 90 tablet 3 09/23/2015 at 0800  . SUMAtriptan (IMITREX) 50 MG tablet TAKE 1 TABLET BY MOUTH AS NEEDED 10 tablet 1 Past Week at Unknown time  . zolpidem (AMBIEN) 10 MG tablet Take 10 mg by mouth at bedtime as needed for sleep.   Past Week at Unknown time  . albuterol (PROVENTIL HFA;VENTOLIN HFA) 108 (90 BASE) MCG/ACT inhaler Inhale 1-2 puffs into the lungs every 6 (six) hours as needed for wheezing or shortness of breath.   More than a month at Unknown time   Scheduled:  .  acetaminophen  1,000 mg Intravenous 4 times per day  . buPROPion  450 mg Oral Daily  . carvedilol  12.5 mg Oral BID WC  . dexamethasone  4 mg Intravenous 4 times per day   Or  . dexamethasone  4 mg Oral 4 times per day  . HYDROmorphone      . HYDROmorphone      . lamoTRIgine  300 mg Oral QHS  . methocarbamol      . montelukast  10 mg Oral Daily  . oxyCODONE      . sodium chloride  3 mL Intravenous Q12H  . spironolactone  25 mg Oral Once   Infusions:  . lactated ringers     Assessment: 54yo female here for spine surgery. Pharmacy is consulted to dose vancomycin for surgical prophylaxis. Pt does not have a drain in place. Will give 1 dose 12 hours post-op.  Goal of Therapy:  Prevention of infection  Plan:  Vancomycin 1g IV once Monitor clinical course  Andrey Cota. Diona Foley, [place] Pharmacist Pager [phone] 09/23/2015,5:23 PM

## 2015-09-23 NOTE — Brief Op Note (Signed)
09/23/2015  3:09 PM  PATIENT:  Tammy Randolph  54 y.o. female  PRE-OPERATIVE DIAGNOSIS:  CERVICAL SPONDYLOTIC RADICULOPATHY   POST-OPERATIVE DIAGNOSIS:  CERVICAL SPONDYLOTIC RADICULOPATHY   PROCEDURE:  Procedure(s): ANTERIOR CERVICAL DISCECTOMY FUSION C4-7     (3 LEVELS) (N/A)  SURGEON:  Surgeon(s) and Role:    * Melina Schools, MD - Primary  PHYSICIAN ASSISTANT:   ASSISTANTS: carmen mayo   ANESTHESIA:   general  EBL:  Total I/O In: 3000 [I.V.:3000] Out: 850 [Urine:600; Blood:250]  BLOOD ADMINISTERED:none  DRAINS: none   LOCAL MEDICATIONS USED:  MARCAINE     SPECIMEN:  No Specimen  DISPOSITION OF SPECIMEN:  N/A  COUNTS:  YES  TOURNIQUET:  * No tourniquets in log *  DICTATION: .Other Dictation: Dictation Number [id number]  PLAN OF CARE: Admit for overnight observation  PATIENT DISPOSITION:  PACU - hemodynamically stable.

## 2015-09-23 NOTE — Anesthesia Postprocedure Evaluation (Signed)
  Anesthesia Post-op Note  Patient: Tammy Randolph  Procedure(s) Performed: Procedure(s): ANTERIOR CERVICAL DISCECTOMY FUSION C4-7     (3 LEVELS) (N/A)  Patient Location: PACU  Anesthesia Type:General  Level of Consciousness: awake and alert   Airway and Oxygen Therapy: Patient Spontanous Breathing  Post-op Pain: mild  Post-op Assessment: Post-op Vital signs reviewed   LLE Sensation: Full sensation   RLE Sensation: Full sensation      Post-op Vital Signs: Reviewed  Last Vitals:  Filed Vitals:   09/23/15 1645  BP: 138/93  Pulse: 97  Temp: 36.7 C  Resp: 10    Complications: No apparent anesthesia complications

## 2015-09-23 NOTE — Anesthesia Preprocedure Evaluation (Signed)
Anesthesia Evaluation  Patient identified by MRN, date of birth, ID band Patient awake    Reviewed: Allergy & Precautions, NPO status , Patient's Chart, lab work & pertinent test results, reviewed documented beta blocker date and time   Airway Mallampati: I  TM Distance: >3 FB Neck ROM: Limited    Dental  (+) Teeth Intact, Dental Advisory Given   Pulmonary asthma , former smoker,    breath sounds clear to auscultation       Cardiovascular hypertension, Pt. on medications and Pt. on home beta blockers  Rhythm:Regular Rate:Normal     Neuro/Psych  Headaches, Anxiety Depression Cervical radiculopathy    GI/Hepatic negative GI ROS, Neg liver ROS,   Endo/Other  negative endocrine ROS  Renal/GU negative Renal ROS     Musculoskeletal  (+) Arthritis ,   Abdominal   Peds  Hematology negative hematology ROS (+)   Anesthesia Other Findings   Reproductive/Obstetrics                             Anesthesia Physical Anesthesia Plan  ASA: II  Anesthesia Plan: General   Post-op Pain Management:    Induction: Intravenous  Airway Management Planned: Oral ETT  Additional Equipment:   Intra-op Plan:   Post-operative Plan: Extubation in OR  Informed Consent: I have reviewed the patients History and Physical, chart, labs and discussed the procedure including the risks, benefits and alternatives for the proposed anesthesia with the patient or authorized representative who has indicated his/her understanding and acceptance.   Dental advisory given  Plan Discussed with: CRNA  Anesthesia Plan Comments:         Anesthesia Quick Evaluation

## 2015-09-23 NOTE — Discharge Instructions (Signed)

## 2015-09-23 NOTE — H&P (Signed)
History of Present Illness  The patient is a 54 year old female who presents with neck pain. The patient is seen today in referral from Dr Tonita Cong. The patient reports symptoms involving the entire neck which began 2 year(s) ago. The symptoms began without any known injury. Symptoms include neck pain, neck stiffness, numbness (fingers on the left), tingling, arm numbness and headaches, while symptoms do not include shoulder pain (aches to the elbow). The pain radiates to the right hand. The patient describes the pain as sharp. The symptoms occur constantly.The patient describes their symptoms as severe.The patient does feel that the symptoms are worsening. Symptoms are exacerbated by neck movement. Symptoms are not relieved by ice or heat. Current treatment includes non-opioid analgesics (Tylenol or Aleve). Past evaluation has included cervical spine MRI (@ [facility] 04-14-15). Past treatment has included opioid analgesics, physical therapy and spinal injections (did not help).  Allergies  Penicillin G Sodium *PENICILLINS*  Family History Osteoarthritis mother and grandmother fathers side Kidney disease brother Osteoporosis mother Severe allergy mother Rheumatoid Arthritis grandmother mothers side Cerebrovascular Accident mother, father and sister Cancer mother, father and grandmother mothers side Congestive Heart Failure grandmother mothers side Hypertension mother and grandmother mothers side Depression mother, sister, brother and grandmother mothers side  Social History  Marital status married Living situation live with spouse Illicit drug use no Tobacco use Never smoker. Tobacco / smoke exposure no Pain Contract no Current work status working part time Children 3 Alcohol use current drinker; drinks wine; less than 5 per week Exercise Exercises weekly; does running / walking and gym / weights Drug/Alcohol Rehab (Previously) no Drug/Alcohol Rehab (Currently)  no  Medication History  BuPROPion HCl ER (XL) (150MG  Tablet ER 24HR, Oral) Active. LamoTRIgine (150MG  Tablet, Oral) Active. Celecoxib (200MG  Capsule, Oral) Active. ALPRAZolam (1MG  Tablet, Oral) Active. Carvedilol (6.25MG  Tablet, Oral) Active. PROzac (20MG  Capsule, Oral) Active. Losartan Potassium (100MG  Tablet, Oral) Active. Zolpidem Tartrate (10MG  Tablet, Oral) Active. Amphetamine-Dextroamphet ER (20MG  Capsule ER 24HR, Oral) Active. Xartemis XR (7.5-325MG  Tablet ER, Oral) Active. Medications Reconciled  Past Surgical History  Spinal Surgery Straighten Nasal Septum Tonsillectomy Anal Fissure Repair Breast Reconstruction bilateral Hysterectomy complete (non-cancerous)  Other Problems  High blood pressure Migraine Headache Skin Cancer Anxiety Disorder Chronic Pain Depression   Objective Transcription Pain with range of motion. She has C5 dysesthesias and intermittent leg pain. She also has numbness in the C6-C7 distribution, right side greater than the left. She has 5/5 strength in the upper extremities. Reflexes are 1+ and symmetrical throughout. Negative Hoffmann sign. Negative Babinski. Negative clonus. Horrific low back pain with palpation, range of motion. Well-healed surgical scar from her previous discectomy. She also has a well-healed surgical scar over her right knee from multi ligamentous disruption and surgery. She has got dysesthesias in her L5-S1 distribution on the right side. Horrific pain with palpation of the back. No real hip, knee or ankle pain with isolated joint range of motion. Negative straight leg raise test. Compartments are soft and nontender.  IMAGING MRI of her neck and low back as well as x-rays were reviewed. X-rays of lumbar spine taken on 04/06/2015 showed marked collapse of the L5-S1 disc space. No gross instability on forward flexion. No spondylosis. She has a trace degenerative scoliosis in the mid lumbar spine. No  significant hip arthrosis is noted.  Cervical x-rays again from 04/06/2015 demonstrate loss of normal cervical lordosis with frank kyphosis in the mid cervical region, C4 to C7. Advanced degenerative disc disease at C4-5, C5-6 and  C6-7 discs. Also moderate facet arthrosis at those levels.  Cervical MRI:no frank cord signal changes. She does have loss of cervical lordosis from C4 to C7 with marked prolapse of the disc spaces at those level. Hard disc osteophytes, most prominent on the left side at C4-5 causing lateral recess stenosis. At C5-6, there is moderate left foraminal stenosis secondary to a left hard disc extrusion. At C6-7, there is moderate foraminal stenosis bilaterally.    Assessment & Plan Anterior cervical fusion:Risks of surgery include, but are not limited to: Throat pain, swallowing difficulty, hoarseness or change in voice, death, stroke, paralysis, nerve root damage/injury, bleeding, blood clots, loss of bowel/bladder control, hardware failure, or mal-position, spinal fluid leak, adjacent segment disease, non-union, need for further surgery, ongoing or worse pain, infection. Post-operative bleeding or swelling that could require emergent surgery. Goal Of Surgery:Discussed that goal of surgery is to reduce pain and improve function and quality of life. Patient is aware that despite all appropriate treatment that there pain and function could be the same, worse, or different.  Plans Transcription At this point in time, we have gone over the radiographic studies and the clinical findings. The prominent source of disability is her cervical spine at present. She has had long-term chiropractic treatment with Dr. Raynelle Chary. She states that recently he told her there is nothing further he can do. She has had, over the last several years, cervical and lumbar epidural injections and they have no longer provided any significant relief. She has been on anti-inflammatory medications and narcotic  medications, again, nothing really providing any significant positive long-term results. She has also had formal physical therapy for both the cervical and lumbar spine. The last therapy session was about a year or two ago. At this point in time, her quality of life is suffering. Her pain is more intense, but it is the same type of pain. At this point, having tried and failed long-term conservative management, she would like to proceed with surgery to address the cervical spine. This would be a three-level ACDF. We can address the loss of lordosis, the collapsed disc and the neural compression. I have explained the risks which include infection, bleeding, nerve damage, death, stroke, paralysis, failure to heal, need for further surgery, ongoing or worse pain, throat pain, swallowing difficulties, hoarseness in the voice, need for further surgery. Because it would be a multilevel procedure, I would also use an external bone stimulator for at least nine months. All of her questions were addressed. We have discussed the procedure, activity modification. She was present for the dictation. I have asked her to just think about this overnight and let me know if she still wants to proceed.

## 2015-09-23 NOTE — Anesthesia Procedure Notes (Addendum)
Procedure Name: Intubation Date/Time: 09/23/2015 11:00 AM Performed by: Jacquiline Doe A Pre-anesthesia Checklist: Patient identified, Timeout performed, Emergency Drugs available, Suction available and Patient being monitored Patient Re-evaluated:Patient Re-evaluated prior to inductionOxygen Delivery Method: Circle system utilized Preoxygenation: Pre-oxygenation with 100% oxygen Intubation Type: IV induction and Cricoid Pressure applied Ventilation: Mask ventilation without difficulty and Oral airway inserted - appropriate to patient size Laryngoscope Size: Miller and 4 Grade View: Grade I Tube type: Oral Tube size: 7.0 mm Number of attempts: 1 Airway Equipment and Method: Video-laryngoscopy and Rigid stylet Placement Confirmation: ETT inserted through vocal cords under direct vision,  positive ETCO2 and breath sounds checked- equal and bilateral Secured at: 21 cm Tube secured with: Tape Dental Injury: Teeth and Oropharynx as per pre-operative assessment  Difficulty Due To: Difficult Airway- due to reduced neck mobility, Difficult Airway-  due to neck instability and Difficulty was anticipated

## 2015-09-24 ENCOUNTER — Encounter (HOSPITAL_COMMUNITY): Payer: Self-pay | Admitting: Orthopedic Surgery

## 2015-09-24 DIAGNOSIS — M4722 Other spondylosis with radiculopathy, cervical region: Secondary | ICD-10-CM | POA: Diagnosis not present

## 2015-09-24 NOTE — Evaluation (Signed)
Occupational Therapy Evaluation Patient Details Name: Tammy Randolph MRN: [id number] DOB: [birth year] Today's Date: 09/24/2015    History of Present Illness Pt is a 54 y/o female admitted s/p ACDF C4-7 on 09/23/15.   Clinical Impression   Pt is functioning at a modified independent level in ADL. Moving well.  All education completed with regard to cervical precaution for ADL and IADL. Pt is eager to go home. No further OT needs.    Follow Up Recommendations  No OT follow up    Equipment Recommendations  None recommended by OT    Recommendations for Other Services       Precautions / Restrictions Precautions Precautions: Fall;Cervical Precaution Comments: Handout provided. Discussed precautions with pt and husband Required Braces or Orthoses: Cervical Brace Cervical Brace: Hard collar;At all times Restrictions Weight Bearing Restrictions: No      Mobility Bed Mobility Overal bed mobility: Modified Independent             General bed mobility comments: Proper log roll technique utilized  Transfers Overall transfer level: Modified independent Equipment used: None             General transfer comment: No unsteadiness noted. No physical assist required.     Balance Overall balance assessment: No apparent balance deficits (not formally assessed)                                          ADL Overall ADL's : Modified independent                                       General ADL Comments: Instructed pt in 2 cup method for brushing teeth, may reach top of head and lowest cupboard.  Instructed in cervical precautions related to ADL and IADL.  Family has a housekeeper and pt's husband and children can assist with IADL involving lifting and bending. Pt easily able to bring feet her to don/doff socks.         Vision     Perception     Praxis      Pertinent Vitals/Pain Pain Assessment: 0-10 Pain Score: 2  Pain Location:  incision Pain Descriptors / Indicators: Operative site guarding Pain Intervention(s): Monitored during session;Limited activity within patient's tolerance     Hand Dominance Right   Extremity/Trunk Assessment Upper Extremity Assessment Upper Extremity Assessment: Overall WFL for tasks assessed   Lower Extremity Assessment Lower Extremity Assessment: Overall WFL for tasks assessed   Cervical / Trunk Assessment Cervical / Trunk Assessment: Other exceptions Cervical / Trunk Exceptions: Cervical incision   Communication Communication Communication: No difficulties   Cognition Arousal/Alertness: Awake/alert Behavior During Therapy: WFL for tasks assessed/performed Overall Cognitive Status: Within Functional Limits for tasks assessed                     General Comments       Exercises       Shoulder Instructions      Home Living Family/patient expects to be discharged to:: Private residence Living Arrangements: Spouse/significant other;Children Available Help at Discharge: Family;Available 24 hours/day Type of Home: House Home Access: Stairs to enter [employer] of Steps: 2 Entrance Stairs-Rails: Right Home Layout: Two level;Able to live on main level with bedroom/bathroom     Bathroom Shower/Tub: Walk-in shower  Bathroom Toilet: Programmer, systems: Yes   Home Equipment: Shower seat          Prior Functioning/Environment Level of Independence: Independent             OT Diagnosis:     OT Problem List:     OT Treatment/Interventions:      OT Goals(Current goals can be found in the care plan section) Acute Rehab OT Goals Patient Stated Goal: Home today  OT Frequency:     Barriers to D/C:            Co-evaluation              End of Session    Activity Tolerance: Patient tolerated treatment well Patient left: in bed;with call bell/phone within reach;with family/visitor present   Time: 8182-9937 OT  Time Calculation (min): 20 min Charges:  OT General Charges $OT Visit: 1 Procedure OT Evaluation $Initial OT Evaluation Tier I: 1 Procedure G-Codes: OT G-codes **NOT FOR INPATIENT CLASS** Functional Assessment Tool Used: clinical judgement Functional Limitation: Self care Self Care Current Status (J6967): At least 1 percent but less than 20 percent impaired, limited or restricted Self Care Goal Status (E9381): At least 1 percent but less than 20 percent impaired, limited or restricted Self Care Discharge Status [phone]): At least 1 percent but less than 20 percent impaired, limited or restricted  Malka So 09/24/2015, 9:21 AM  [phone]

## 2015-09-24 NOTE — Evaluation (Signed)
Physical Therapy Evaluation and Discharge Patient Details Name: Tammy Randolph MRN: [id number] DOB: March 02, 1961 Today's Date: 09/24/2015   History of Present Illness  Pt is a 54 y/o female admitted s/p ACDF C4-7 on 09/23/15.  Clinical Impression  Patient evaluated by Physical Therapy with no further acute PT needs identified. All education has been completed and the patient has no further questions. At the time of PT eval pt was able to perform all mobility with modified independence. Pt will have assistance at home upon d/c if needed. See below for any follow-up Physial Therapy or equipment needs. PT is signing off. Thank you for this referral.     Follow Up Recommendations No PT follow up    Equipment Recommendations  None recommended by PT    Recommendations for Other Services       Precautions / Restrictions Precautions Precautions: Fall;Cervical Precaution Comments: Handout provided. Discussed precautions with pt and husband Required Braces or Orthoses: Cervical Brace Cervical Brace: Hard collar;At all times Restrictions Weight Bearing Restrictions: No      Mobility  Bed Mobility Overal bed mobility: Modified Independent             General bed mobility comments: Proper log roll technique utilized  Transfers Overall transfer level: Modified independent Equipment used: None             General transfer comment: No unsteadiness noted. No physical assist required.   Ambulation/Gait Ambulation/Gait assistance: Modified independent (Device/Increase time) Ambulation Distance (Feet): 500 Feet Assistive device: None Gait Pattern/deviations: WFL(Within Functional Limits)   Gait velocity interpretation: at or above normal speed for age/gender General Gait Details: No unstadiness noted and no dizziness reported  Stairs Stairs: Yes Stairs assistance: Modified independent (Device/Increase time) Stair Management: One rail Right Number of Stairs: 8 General stair  comments: Alternating step pattern. No assist required.   Wheelchair Mobility    Modified Rankin (Stroke Patients Only)       Balance Overall balance assessment: No apparent balance deficits (not formally assessed)                                           Pertinent Vitals/Pain Pain Assessment: 0-10 Pain Score: 2  Pain Location: Incision Pain Descriptors / Indicators: Operative site guarding Pain Intervention(s): Limited activity within patient's tolerance;Monitored during session;Repositioned;Premedicated before session    Home Living Family/patient expects to be discharged to:: Private residence Living Arrangements: Spouse/significant other Available Help at Discharge: Family;Available 24 hours/day Type of Home: House Home Access: Stairs to enter Entrance Stairs-Rails: Right Entrance Stairs-Number of Steps: 2 Home Layout: Two level;Able to live on main level with bedroom/bathroom Home Equipment: Shower seat      Prior Function Level of Independence: Independent               Hand Dominance   Dominant Hand: Right    Extremity/Trunk Assessment   Upper Extremity Assessment: Overall WFL for tasks assessed           Lower Extremity Assessment: Overall WFL for tasks assessed      Cervical / Trunk Assessment: Other exceptions  Communication   Communication: No difficulties  Cognition Arousal/Alertness: Awake/alert Behavior During Therapy: WFL for tasks assessed/performed Overall Cognitive Status: Within Functional Limits for tasks assessed                      General Comments  Exercises        Assessment/Plan    PT Assessment Patent does not need any further PT services  PT Diagnosis Acute pain   PT Problem List    PT Treatment Interventions     PT Goals (Current goals can be found in the Care Plan section) Acute Rehab PT Goals Patient Stated Goal: Home today PT Goal Formulation: All assessment and education  complete, DC therapy    Frequency     Barriers to discharge        Co-evaluation               End of Session Equipment Utilized During Treatment: Cervical collar Activity Tolerance: Patient tolerated treatment well Patient left: with call bell/phone within reach;with family/visitor present (Sitting EOB) Nurse Communication: Mobility status    Functional Assessment Tool Used: Clinical judgement Functional Limitation: Mobility: Walking and moving around Mobility: Walking and Moving Around Current Status (N2778): At least 1 percent but less than 20 percent impaired, limited or restricted Mobility: Walking and Moving Around Goal Status [phone]): At least 1 percent but less than 20 percent impaired, limited or restricted Mobility: Walking and Moving Around Discharge Status [phone]): At least 1 percent but less than 20 percent impaired, limited or restricted    Time: 0810-0820 PT Time Calculation (min) (ACUTE ONLY): 10 min   Charges:   PT Evaluation $Initial PT Evaluation Tier I: 1 Procedure     PT G Codes:   PT G-Codes **NOT FOR INPATIENT CLASS** Functional Assessment Tool Used: Clinical judgement Functional Limitation: Mobility: Walking and moving around Mobility: Walking and Moving Around Current Status (R1540): At least 1 percent but less than 20 percent impaired, limited or restricted Mobility: Walking and Moving Around Goal Status [phone]): At least 1 percent but less than 20 percent impaired, limited or restricted Mobility: Walking and Moving Around Discharge Status [phone]): At least 1 percent but less than 20 percent impaired, limited or restricted    Rolinda Roan 09/24/2015, 9:11 AM   Rolinda Roan, PT, DPT Acute Rehabilitation Services Pager: [phone]

## 2015-09-24 NOTE — Op Note (Signed)
NAMESAAYA, PROCELL               ACCOUNT NO.:  [id number]  MEDICAL RECORD NO.:  [id number]  LOCATION:  [id number]                        FACILITY:  [facility]  PHYSICIAN:  Carroll Ranney D. Rolena Infante, M.D. 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  OPERATIVE PROCEDURE:  Anterior cervical diskectomy and fusion, C4-C7.  FIRST ASSISTANT:  [place], [place].  INDICATIONS:  This is a very pleasant 54 year old woman who has been having progressive neck and radicular arm pain for sometime.  Despite injection therapy, physical therapy, and activity modification, her overall pain symptoms continued to worsen.  As a result, we elected to proceed with surgery.  All appropriate risks, benefits, and alternatives were discussed with the patient and consent was obtained.  OPERATIVE NOTE:  The patient was brought to the operating room, placed supine on the operating table.  After successful induction of general anesthesia and endotracheal intubation, TEDs, SCDs, and a Foley were inserted.  The arms were secured down at the side and an inflatable bump was placed underneath the shoulder blades.  The neck and the anterior cervical spine was then prepped and draped in a standard fashion.  Time- out was taken to confirm patient, procedure, and all other pertinent important data.  Once this was completed, using lateral fluoroscopy, I identified the C5-6 disk space.  I marked at this level and infiltrated the incision site with 0.25% Marcaine with epi.  I then made a transverse incision starting at the midline and proceeding to the left. Sharp dissection was carried out down to the platysma.  The platysma was isolated and dissected.  I then sharply dissected along the medial border of the  sternocleidomastoid performing a standard Smith-Robinson approach to the anterior cervical spine.  The omohyoid was identified and sacrificed given the multiple level nature of the procedure underneath for better visualization.  I then identified and protected the carotid sheath laterally with a finger.  I swept the esophagus to the right and then used my Kittner dissectors to dissect through the remaining prevertebral fascia to expose the anterior longitudinal ligament.  I then exposed the C4-5, C5-6, and C6-7 disk spaces.  A needle was placed into the C4-5 disk space, and an x-ray was taken to confirm that I was at the appropriate level.  Once this was done, I then with retractors protecting the esophagus and trachea, I mobilized the longus colli muscle on the right side from the midbody of C4 to the midbody of C7.  I then protected the carotid sheath and used the same thing on the left-hand side from C4-7.  I then placed a Shadow Line retractor blades underneath the longus colli muscle, deflated the endotracheal cuff, expanded the retractor, and then reinflated the endotracheal cuff.  The C4-5 disk space was properly exposed. Distraction pins were placed into the bodies of C4 and C5 and an annulotomy was performed.  Pituitary rongeurs, curettes, and Kerrison rongeurs were used to remove the disk material.  The disk space was gently distracted and distraction was  held in place using distraction pins.  I then continued to work posteriorly and then using a nerve hook removed a large disk herniation that was in the posterolateral right corner.  Once this was removed, I used my nerve hook to develop a plane underneath the posterior longitudinal ligament and using my 1 mm Kerrison resected the posterior longitudinal ligament.  I then undercut the uncovertebral joints to allow for an adequate foraminotomy.  At this point, I was pleased with the decompression.  I rasped the endplates and made  sure I had bleeding subchondral bone.  I trialed a 7 and 6 small Titan titanium intervertebral spacer and elected to use the 6.  This was packed with DBX mix and malleted to the appropriate depth.  I then repositioned my retractors at the C6-7 disk space level using my distraction pins. Using the same technique, I removed the disk material.  There was significant degenerative change at this level with osteophyte formation. These were all taken down posteriorly with a 1-mm Kerrison rongeur.  I again undercut the uncovertebral joints.  At this point, with the diskectomy completed, I then trialed a 6 and 7 spacer and elected to use a 7 small Titan titanium spacer.  Again, this was packed with DBX mix and malleted to the appropriate depth.  I then repositioned my retractors and exposed the C5-6 disk space.  Again using the same technique that I had used at C6-7, I performed a complete diskectomy at C5-6.  There were no complicating features.  Once the decompression was completed, I then placed the size 7 Titan titanium small spacer packed with DBX mix.  Once all 3 cages were in place, x-rays were satisfactory, the kyphotic deformity that was present preoperatively had resolved.  I then took the Synthes DePuy transitional plate size 51 and placed it in the spine.  Although, the problem with this plate was that it was slightly too long.  Putting it just at the inferior aspect of the C4 vertebral body placed it too close to the C7-T1 disk space.  At this point, I elected to use a size 49.  However, with the 49-mm plate, the C5 screws did not fall over the C5 vertebral body.  However, I did not want to migrate the plate superiorly as it would place the plate too close to the C3-4 disk space and increase the likelihood of adjacent segment disease.  At this point, I elected to use a 49 plate.  I fixed it to the vertebral body of C4 with 14-mm screws and C6 with 14-mm screws.  All 4 screws had  excellent purchase.  The esophagus was protected throughout the procedure.  All screws were then tightened down to finger tight according to manufacture's standard.  The D ring did expand and capture the screws, the screws were flushed to the plate.  I then placed 2 screws size 12 mm in length into the body of C6.  Again, at C5 I could not place screws because the screw holes themselves were sitting over the cage and not over the vertebral body, but I did have excellent purchase with all 6 screws.  I then removed the plastic tabs to allow for the compression of the transitional plate.  All screws had excellent purchase.  The esophagus was checked to ensure that it was not entrapped beneath the plate.  I irrigated the wound copiously with normal saline and made sure I had hemostasis using bipolar electrocautery.  I then returned  the trachea and esophagus back to the midline.  I closed the platysma with interrupted 2-0 Vicryl suture and a 3-0 Monocryl for the skin.  Steri-Strips and a dry dressing were applied.  At the end of the case, all needle and sponge counts were correct.  There were no adverse intraoperative events.     Xzaria Teo D. Rolena Infante, M.D.     DDB/MEDQ  D:  09/23/2015  T:  09/24/2015  Job:  [id number]  cc:   Dr. Rolena Infante' office

## 2015-09-24 NOTE — Progress Notes (Signed)
Patient ID: Tammy Randolph, female   DOB: [birth year], 54 y.o.   MRN: [id number]    Subjective: 1 Day Post-Op Procedure(s) (LRB): ANTERIOR CERVICAL DISCECTOMY FUSION C4-7     (3 LEVELS) (N/A) Patient reports pain as 3/10 Denies CP or SOB.  Voiding without difficulty. Positive flatus. Pt had urinary incontinence last night.  After bladder scan did in/out cath. Improved this am.  Objective: Vital signs in last 24 hours: Temp:  [97.6 F (36.4 C)-98.5 F (36.9 C)] 97.9 F (36.6 C) (09/29 0400) Pulse Rate:  [82-113] 105 (09/29 0400) Resp:  [8-27] 18 (09/29 0400) BP: (111-157)/(82-104) 142/92 mmHg (09/29 0400) SpO2:  [94 %-100 %] 95 % (09/29 0400) Weight:  [73.936 kg (163 lb)] 73.936 kg (163 lb) (09/28 0927)  Intake/Output from previous day: 09/28 0701 - 09/29 0700 In: 3240 [P.O.:240; I.V.:3000] Out: 2617 [Urine:2367; Blood:250] Intake/Output this shift:    Labs:  Recent Labs  09/21/15 1134  HGB 14.3    Recent Labs  09/21/15 1134  WBC 8.7  RBC 4.62  HCT 42.7  PLT 298    Recent Labs  09/21/15 1134  NA 140  K 4.1  CL 101  CO2 29  BUN 11  CREATININE 0.92  GLUCOSE 94  CALCIUM 9.6   No results for input(s): LABPT, INR in the last 72 hours.  Physical Exam: Neurologically intact ABD soft Sensation intact distally Intact pulses distally Dorsiflexion/Plantar flexion intact Incision: dressing C/D/I and no drainage Compartment soft  Assessment/Plan: 1 Day Post-Op Procedure(s) (LRB): ANTERIOR CERVICAL DISCECTOMY FUSION C4-7     (3 LEVELS) (N/A) Advance diet Up with therapy  Consider discharge after PT Discussed with Dr. Jalene Mullet, Darla Lesches for Dr. Melina Schools [facility] [phone] 09/24/2015, 7:40 AM

## 2015-09-24 NOTE — Progress Notes (Signed)
Pt doing well. Pt and husband given D/C instructions with Rx's, verbal understanding was provided. Pt's incision is clean and dry with no sign of infection. Pt's IV was removed prior to D/C. Pt D/C'd home via wheelchair @ [phone] per MD order. Pt is stable @ D/C and has no other needs at this time. Holli Humbles, RN

## 2015-09-25 ENCOUNTER — Encounter (HOSPITAL_COMMUNITY): Admission: RE | Payer: Self-pay | Source: Ambulatory Visit

## 2015-09-25 SURGERY — US RECTUM
Anesthesia: Moderate Sedation

## 2015-09-28 NOTE — Discharge Summary (Signed)
Physician Discharge Summary  Patient ID: Tammy Randolph MRN: [id number] DOB/AGE: [birth year] 52 y.o.  Admit date: 09/23/2015 Discharge date: 09/28/2015  Admission Diagnoses:  <principal problem not specified>  Discharge Diagnoses:  Active Problems:   Neck pain   Past Medical History  Diagnosis Date  . Seasonal allergies   . Anxiety   . History of blood transfusion 2003  . Asthma   . Hypertension   . Depression   . Chronic fatigue   . PONV (postoperative nausea and vomiting)   . Spinal headache   . Pneumonia     couple of times last being last year  . Bipolar disorder   . Arthritis     Surgeries: Procedure(s): ANTERIOR CERVICAL DISCECTOMY FUSION C4-7     (3 LEVELS) on 09/23/2015   Consultants (if any):    Discharged Condition: Improved  Hospital Course: Tammy Randolph is an 54 y.o. female who was admitted 09/23/2015 with a diagnosis of cervical pain and radicular arm pain and went to the operating room on 09/23/2015 and underwent the above named procedures.  The pt had some urinary retention post op after the foley was removed.  The nurse in/out cath'ed the pt one time hs.  The next morning the urinary retention was resolved.  The pt met with PT and was cleared. The pt was discharged on 09/24/15.  She was given perioperative antibiotics:  Anti-infectives    Start     Dose/Rate Route Frequency Ordered Stop   09/24/15 0300  vancomycin (VANCOCIN) IVPB 1000 mg/200 mL premix     1,000 mg 200 mL/hr over 60 Minutes Intravenous  Once 09/23/15 1727 09/24/15 0339   09/23/15 1000  vancomycin (VANCOCIN) 1,500 mg in sodium chloride 0.9 % 500 mL IVPB  Status:  Discontinued     1,500 mg 250 mL/hr over 120 Minutes Intravenous To ShortStay Surgical 09/22/15 0929 09/22/15 0931   09/23/15 1000  vancomycin (VANCOCIN) 1,500 mg in sodium chloride 0.9 % 500 mL IVPB  Status:  Discontinued     1,500 mg 250 mL/hr over 120 Minutes Intravenous To ShortStay Surgical 09/22/15 0931 09/23/15 1715     .  She was given sequential compression devices, early ambulation, and TED for DVT prophylaxis.  She benefited maximally from the hospital stay and there were no complications.    Recent vital signs:  Filed Vitals:   09/24/15 0753  BP: 151/94  Pulse: 103  Temp:   Resp: 18    Recent laboratory studies:  Lab Results  Component Value Date   HGB 14.3 09/21/2015   HGB 13.2 12/23/2014   HGB 14.2 12/11/2014   Lab Results  Component Value Date   WBC 8.7 09/21/2015   PLT 298 09/21/2015   Lab Results  Component Value Date   INR 1.00 12/11/2014   Lab Results  Component Value Date   NA 140 09/21/2015   K 4.1 09/21/2015   CL 101 09/21/2015   CO2 29 09/21/2015   BUN 11 09/21/2015   CREATININE 0.92 09/21/2015   GLUCOSE 94 09/21/2015    Discharge Medications:     Medication List    STOP taking these medications        cyclobenzaprine 10 MG tablet  Commonly known as:  FLEXERIL     oxyCODONE 5 MG immediate release tablet  Commonly known as:  Oxy IR/ROXICODONE     zolpidem 10 MG tablet  Commonly known as:  AMBIEN      TAKE these medications  albuterol 108 (90 BASE) MCG/ACT inhaler  Commonly known as:  PROVENTIL HFA;VENTOLIN HFA  Inhale 1-2 puffs into the lungs every 6 (six) hours as needed for wheezing or shortness of breath.     ALPRAZolam 1 MG tablet  Commonly known as:  XANAX  Take 1 mg by mouth 4 (four) times daily as needed for anxiety.     amphetamine-dextroamphetamine 20 MG 24 hr capsule  Commonly known as:  ADDERALL XR  Take 20 mg by mouth daily as needed (concentration).     buPROPion 150 MG 24 hr tablet  Commonly known as:  WELLBUTRIN XL  Take 450 mg by mouth daily.     carvedilol 12.5 MG tablet  Commonly known as:  COREG  Take 1 tablet (12.5 mg total) by mouth 2 (two) times daily with a meal.     lamoTRIgine 150 MG tablet  Commonly known as:  LAMICTAL  Take 300 mg by mouth at bedtime.     methocarbamol 500 MG tablet  Commonly known  as:  ROBAXIN  Take 1 tablet (500 mg total) by mouth 3 (three) times daily as needed for muscle spasms.     montelukast 10 MG tablet  Commonly known as:  SINGULAIR  TAKE ONE TABLET BY MOUTH DAILY     ondansetron 4 MG tablet  Commonly known as:  ZOFRAN  Take 1 tablet (4 mg total) by mouth every 8 (eight) hours as needed for nausea or vomiting.     oxyCODONE-acetaminophen 10-325 MG tablet  Commonly known as:  PERCOCET  Take 1 tablet by mouth every 4 (four) hours as needed for pain.     pseudoephedrine-acetaminophen 30-500 MG Tabs tablet  Commonly known as:  TYLENOL SINUS  Take 1 tablet by mouth every 4 (four) hours as needed (Seasonal Allergies).     spironolactone 25 MG tablet  Commonly known as:  ALDACTONE  Take 1 tablet (25 mg total) by mouth once.     SUMAtriptan 50 MG tablet  Commonly known as:  IMITREX  TAKE 1 TABLET BY MOUTH AS NEEDED        Diagnostic Studies: Dg Cervical Spine 2 Or 3 Views  09/23/2015   CLINICAL DATA:  Status post fusion.  EXAM: CERVICAL SPINE  4+ VIEWS  COMPARISON:  MRI cervical spine 04/14/2015.  FINDINGS: The patient has undergone 3 level ACDF C4-C7 with anterior plating. Grossly satisfactory position and alignment. Facet mediated anterolisthesis C3-C4.  IMPRESSION: Satisfactory postoperative appearance.   Electronically Signed   By: Staci Righter M.D.   On: 09/23/2015 16:48   Dg Cervical Spine 2-3 Views  09/23/2015   CLINICAL DATA:  ACDF at C4-C5, C5-C6 and C6-C7.  EXAM: DG C-ARM GT 120 MIN; CERVICAL SPINE - 2-3 VIEW  CONTRAST:  None  FLUOROSCOPY TIME:  Radiation Exposure Index (as provided by the fluoroscopic device):  If the device does not provide the exposure index:  Fluoroscopy Time (in minutes and seconds):  Is 0 minutes 57 seconds  Number of Acquired Images:  3  COMPARISON:  None.  FINDINGS: Patient intubated. Anterior cervical fusion noted from C 4 through C7. Screws are present in the C4 C6 and C7 vertebral bodies. Interbody spacers noted.   IMPRESSION: Anterior cervical fusion without complication.   Electronically Signed   By: Suzy Bouchard M.D.   On: 09/23/2015 15:13   Dg C-arm Gt 120 Min  09/23/2015   CLINICAL DATA:  ACDF at C4-C5, C5-C6 and C6-C7.  EXAM: DG C-ARM GT 120 MIN; CERVICAL  SPINE - 2-3 VIEW  CONTRAST:  None  FLUOROSCOPY TIME:  Radiation Exposure Index (as provided by the fluoroscopic device):  If the device does not provide the exposure index:  Fluoroscopy Time (in minutes and seconds):  Is 0 minutes 57 seconds  Number of Acquired Images:  3  COMPARISON:  None.  FINDINGS: Patient intubated. Anterior cervical fusion noted from C 4 through C7. Screws are present in the C4 C6 and C7 vertebral bodies. Interbody spacers noted.  IMPRESSION: Anterior cervical fusion without complication.   Electronically Signed   By: Suzy Bouchard M.D.   On: 09/23/2015 15:13    Disposition: 01-Home or Self Care        Follow-up Information    Follow up with Dahlia Bailiff, MD. Schedule an appointment as soon as possible for a visit in 2 weeks.   Specialty:  Orthopedic Surgery   Why:  For suture removal, For wound re-check   Contact information:   [address] [place] [address] [phone]        Signed: Valinda Hoar 09/28/2015, 1:12 PM

## 2015-12-10 ENCOUNTER — Telehealth: Payer: Self-pay

## 2015-12-10 NOTE — Telephone Encounter (Signed)
Left Voice Mail for pt to call back.   RE: Flu Vaccine for 2016  

## 2016-03-03 ENCOUNTER — Other Ambulatory Visit: Payer: Self-pay | Admitting: Family Medicine

## 2016-03-03 NOTE — Telephone Encounter (Signed)
Medication filled to pharmacy as requested.   

## 2016-04-08 DIAGNOSIS — Z803 Family history of malignant neoplasm of breast: Secondary | ICD-10-CM | POA: Diagnosis not present

## 2016-04-08 DIAGNOSIS — Z1231 Encounter for screening mammogram for malignant neoplasm of breast: Secondary | ICD-10-CM | POA: Diagnosis not present

## 2016-04-25 DIAGNOSIS — N3000 Acute cystitis without hematuria: Secondary | ICD-10-CM | POA: Diagnosis not present

## 2016-06-01 DIAGNOSIS — M5136 Other intervertebral disc degeneration, lumbar region: Secondary | ICD-10-CM | POA: Diagnosis not present

## 2016-06-01 DIAGNOSIS — Z9889 Other specified postprocedural states: Secondary | ICD-10-CM | POA: Diagnosis not present

## 2016-06-16 DIAGNOSIS — L237 Allergic contact dermatitis due to plants, except food: Secondary | ICD-10-CM | POA: Diagnosis not present

## 2016-06-16 DIAGNOSIS — F902 Attention-deficit hyperactivity disorder, combined type: Secondary | ICD-10-CM | POA: Diagnosis not present

## 2016-06-16 DIAGNOSIS — F418 Other specified anxiety disorders: Secondary | ICD-10-CM | POA: Diagnosis not present

## 2016-06-16 DIAGNOSIS — M5030 Other cervical disc degeneration, unspecified cervical region: Secondary | ICD-10-CM | POA: Diagnosis not present

## 2016-06-16 DIAGNOSIS — I10 Essential (primary) hypertension: Secondary | ICD-10-CM | POA: Diagnosis not present

## 2016-07-22 ENCOUNTER — Ambulatory Visit: Payer: BLUE CROSS/BLUE SHIELD | Admitting: Internal Medicine

## 2016-08-07 DIAGNOSIS — R05 Cough: Secondary | ICD-10-CM | POA: Diagnosis not present

## 2016-08-07 DIAGNOSIS — J209 Acute bronchitis, unspecified: Secondary | ICD-10-CM | POA: Diagnosis not present

## 2016-08-25 DIAGNOSIS — F3181 Bipolar II disorder: Secondary | ICD-10-CM | POA: Diagnosis not present

## 2016-08-28 ENCOUNTER — Other Ambulatory Visit: Payer: Self-pay | Admitting: Family Medicine

## 2016-08-28 DIAGNOSIS — I10 Essential (primary) hypertension: Secondary | ICD-10-CM

## 2016-09-07 DIAGNOSIS — M659 Synovitis and tenosynovitis, unspecified: Secondary | ICD-10-CM | POA: Diagnosis not present

## 2016-09-07 DIAGNOSIS — M2580 Other specified joint disorders, unspecified joint: Secondary | ICD-10-CM | POA: Diagnosis not present

## 2016-09-10 DIAGNOSIS — Z9889 Other specified postprocedural states: Secondary | ICD-10-CM | POA: Diagnosis not present

## 2016-09-12 ENCOUNTER — Other Ambulatory Visit: Payer: Self-pay

## 2016-09-13 ENCOUNTER — Ambulatory Visit (INDEPENDENT_AMBULATORY_CARE_PROVIDER_SITE_OTHER): Payer: BLUE CROSS/BLUE SHIELD | Admitting: Internal Medicine

## 2016-09-13 ENCOUNTER — Encounter: Payer: Self-pay | Admitting: Internal Medicine

## 2016-09-13 DIAGNOSIS — I10 Essential (primary) hypertension: Secondary | ICD-10-CM | POA: Diagnosis not present

## 2016-09-13 MED ORDER — SPIRONOLACTONE 25 MG PO TABS: 25.0000 mg | ORAL_TABLET | Freq: Once | ORAL | 3 refills | Status: DC

## 2016-09-13 NOTE — Progress Notes (Signed)
HPI Tammy Randolph is referred today for evaluation of hypertension and chest pressure. She is a very pleasant 55 year old woman whose health is been good. She has long-standing hypertension. She has been on medical therapy, and denies noncompliance. She has ADD and takes aderall. The patient exercises but she also notes episodes of chest pressure assoicated with diaphoresis. No sob. No radiation. The pain is mid sternal.  Allergies  Allergen Reactions  . Hydrocodone-Acetaminophen Itching  . Morphine Other (See Comments)    Pt did not like the way it made her feel     Current Outpatient Prescriptions  Medication Sig Dispense Refill  . albuterol (PROVENTIL HFA;VENTOLIN HFA) 108 (90 BASE) MCG/ACT inhaler Inhale 1-2 puffs into the lungs every 6 (six) hours as needed for wheezing or shortness of breath.    . ALPRAZolam (XANAX) 1 MG tablet Take 1 mg by mouth 4 (four) times daily as needed for anxiety.     Marland Kitchen amphetamine-dextroamphetamine (ADDERALL XR) 20 MG 24 hr capsule Take 20 mg by mouth daily as needed (concentration).    Marland Kitchen buPROPion (WELLBUTRIN XL) 150 MG 24 hr tablet Take 450 mg by mouth daily.     . carvedilol (COREG) 12.5 MG tablet Take 1 tablet (12.5 mg total) by mouth 2 (two) times daily with a meal. Office visit due now 180 tablet 0  . lamoTRIgine (LAMICTAL) 100 MG tablet Take 1 tablet by mouth at bedtime.  2  . methocarbamol (ROBAXIN) 500 MG tablet Take 1 tablet (500 mg total) by mouth 3 (three) times daily as needed for muscle spasms. 60 tablet 0  . montelukast (SINGULAIR) 10 MG tablet TAKE ONE TABLET BY MOUTH DAILY 30 tablet 5  . ondansetron (ZOFRAN) 4 MG tablet Take 1 tablet (4 mg total) by mouth every 8 (eight) hours as needed for nausea or vomiting. 20 tablet 0  . oxyCODONE-acetaminophen (PERCOCET) 10-325 MG tablet Take 1 tablet by mouth every 4 (four) hours as needed for pain. 60 tablet 0  . pseudoephedrine-acetaminophen (TYLENOL SINUS) 30-500 MG TABS Take 1 tablet by mouth every 4  (four) hours as needed (Seasonal Allergies).    Marland Kitchen spironolactone (ALDACTONE) 25 MG tablet Take 1 tablet (25 mg total) by mouth once. 90 tablet 3  . SUMAtriptan (IMITREX) 50 MG tablet as directed. TAKE 1 TABLET BY MOUTH AS NEEDED     No current facility-administered medications for this visit.      Past Medical History:  Diagnosis Date  . Anxiety   . Arthritis   . Asthma   . Bipolar disorder ([place])   . Chronic fatigue   . Depression   . History of blood transfusion 2003  . Hypertension   . Pneumonia    couple of times last being last year  . PONV (postoperative nausea and vomiting)   . Seasonal allergies   . Spinal headache     ROS:   All systems reviewed and negative except as noted in the HPI.   Past Surgical History:  Procedure Laterality Date  . ABDOMINAL HYSTERECTOMY  2004   and USO for endometriosis  . ANTERIOR CERVICAL DECOMP/DISCECTOMY FUSION N/A 09/23/2015   Procedure: ANTERIOR CERVICAL DISCECTOMY FUSION C4-7     (3 LEVELS);  Surgeon: Melina Schools, MD;  Location: [facility];  Service: Orthopedics;  Laterality: N/A;  . APPENDECTOMY  1988  . BREAST ENHANCEMENT SURGERY Bilateral 2008  . COLONOSCOPY W/ POLYPECTOMY  2013   Dr Fuller Plan  . KNEE ARTHROSCOPY WITH ANTERIOR CRUCIATE LIGAMENT (ACL) REPAIR Right 01/26/2013  .  LUMBAR LAMINECTOMY  2008  . TONSILLECTOMY  2009  . TUBAL LIGATION     x4 for endometriosis     Family History  Problem Relation Age of Onset  . Colon cancer Brother 51  . Stomach cancer Maternal Uncle 20  . Colon cancer Paternal Uncle 63  . Colon cancer Maternal Grandmother 8  . Sleep apnea Father   . Sleep apnea Sister   . CVA Father   . Coronary artery disease Paternal Aunt   . Pancreatic cancer Paternal Uncle   . CVA Mother     x3  . Hypertension Mother   . Arthritis Mother   . Breast cancer Mother   . Heart Problems Mother     pacemaker  . Heart attack Maternal Uncle   . Clotting disorder Brother   . Hypertension Sister   . Hypertension  Sister      Social History   Social History  . Marital status: Married    Spouse name: N/A  . Number of children: N/A  . Years of education: N/A   Occupational History  . Not on file.   Social History Main Topics  . Smoking status: Former Smoker    Quit date: 12/26/1986  . Smokeless tobacco: Never Used     Comment: up to 1& 1/2packs /week  . Alcohol use 0.0 oz/week     Comment: socially  . Drug use: No  . Sexual activity: Not on file   Other Topics Concern  . Not on file   Social History Narrative  . No narrative on file     BP (!) 172/118   Pulse 83   Ht 5' 4.5" (1.638 m)   Wt 157 lb (71.2 kg)   BMI 26.53 kg/m   Physical Exam:  Well appearing 55 year old woman, NAD HEENT: Unremarkable Neck:  6 cm JVD, no thyromegally Back:  No CVA tenderness Lungs:  Clear with no wheezes, rales, or rhonchi. HEART:  Regular rate rhythm, no murmurs, no rubs, no clicks Abd:  soft, positive bowel sounds, no organomegally, no rebound, no guarding Ext:  2 plus pulses, no edema, no cyanosis, no clubbing Skin:  No rashes no nodules Neuro:  CN II through XII intact, motor grossly intact  EKG - normal sinus rhythm with normal axis and intervals   Assess/Plan: 1. Chest pain - her symptoms are atypical. I have asked the patient to undergo exercise testing. 2. HTN - her blood pressure remains high. I would like her to increase her coreg to 18.75 bid. Will restart her on aldactone. 3. Anxiety - this seems to be fairly well controlled. Will follow.  Mikle Bosworth.D.

## 2016-09-13 NOTE — Patient Instructions (Signed)
Medication Instructions:  Your physician recommends that you continue on your current medications as directed. Please refer to the Current Medication list given to you today.   Labwork: None ordered   Testing/Procedures: Your physician has requested that you have an exercise tolerance test. For further information please visit [email or web address]. Please also follow instruction sheet, as given.    Follow-Up: Your physician recommends that you schedule a follow-up appointment in: 4 weeks with Dr Lovena Le   Any Other Special Instructions Will Be Listed Below (If Applicable).     If you need a refill on your cardiac medications before your next appointment, please call your pharmacy.

## 2016-09-17 DIAGNOSIS — R2241 Localized swelling, mass and lump, right lower limb: Secondary | ICD-10-CM | POA: Diagnosis not present

## 2016-09-20 ENCOUNTER — Telehealth: Payer: Self-pay | Admitting: *Deleted

## 2016-09-20 ENCOUNTER — Encounter: Payer: Self-pay | Admitting: Internal Medicine

## 2016-09-20 NOTE — Telephone Encounter (Signed)
Left message for patient in regards to her BP.  I have asked she call me back when possible to discuss. Will need to increase her Carvedilol if still running high.  Will increase to 18.75 mg bid.  I will wait for her to call me back.

## 2016-09-21 ENCOUNTER — Ambulatory Visit (INDEPENDENT_AMBULATORY_CARE_PROVIDER_SITE_OTHER): Payer: BLUE CROSS/BLUE SHIELD

## 2016-09-21 DIAGNOSIS — I10 Essential (primary) hypertension: Secondary | ICD-10-CM | POA: Diagnosis not present

## 2016-09-21 LAB — EXERCISE TOLERANCE TEST
CSEPED: 10 min
CSEPHR: 89 %
CSEPPHR: 151 {beats}/min
Estimated workload: 11.8 METS
Exercise duration (sec): 4 s
MPHR: 168 {beats}/min
RPE: 17
Rest HR: 80 {beats}/min

## 2016-09-23 DIAGNOSIS — S76311A Strain of muscle, fascia and tendon of the posterior muscle group at thigh level, right thigh, initial encounter: Secondary | ICD-10-CM | POA: Diagnosis not present

## 2016-09-23 DIAGNOSIS — R2241 Localized swelling, mass and lump, right lower limb: Secondary | ICD-10-CM | POA: Diagnosis not present

## 2016-10-12 ENCOUNTER — Ambulatory Visit (INDEPENDENT_AMBULATORY_CARE_PROVIDER_SITE_OTHER): Payer: BLUE CROSS/BLUE SHIELD | Admitting: Internal Medicine

## 2016-10-12 ENCOUNTER — Other Ambulatory Visit: Payer: Self-pay

## 2016-10-12 ENCOUNTER — Encounter: Payer: Self-pay | Admitting: Internal Medicine

## 2016-10-12 VITALS — BP 130/88 | HR 90 | Ht 65.0 in | Wt 157.0 lb

## 2016-10-12 DIAGNOSIS — I10 Essential (primary) hypertension: Secondary | ICD-10-CM

## 2016-10-12 NOTE — Patient Instructions (Signed)

## 2016-10-12 NOTE — Progress Notes (Signed)
HPI [name] returns today for evaluation of hypertension and chest pressure. She is a very pleasant 55 year old woman whose health is been good. She has long-standing hypertension. She has been on medical therapy, and denies noncompliance. She has ADD and takes aderall. The patient exercises but she also notes episodes of chest pressure assoicated with diaphoresis. No sob. No radiation. The pain is mid sternal. She has undergone exercise testing where she walked over 8 minutes with no angina and no STT changes. She did have a hypertensive response to exercise. Her chest pain is quiet.  Allergies  Allergen Reactions  . Hydrocodone-Acetaminophen Itching and Rash  . Morphine Other (See Comments)    Pt did not like the way it made her feel     Current Outpatient Prescriptions  Medication Sig Dispense Refill  . albuterol (PROVENTIL HFA;VENTOLIN HFA) 108 (90 BASE) MCG/ACT inhaler Inhale 1-2 puffs into the lungs every 6 (six) hours as needed for wheezing or shortness of breath.    . ALPRAZolam (XANAX) 1 MG tablet Take 1 mg by mouth 4 (four) times daily as needed for anxiety.     Marland Kitchen amphetamine-dextroamphetamine (ADDERALL XR) 20 MG 24 hr capsule Take 20 mg by mouth daily as needed (concentration).    Marland Kitchen buPROPion (WELLBUTRIN XL) 150 MG 24 hr tablet Take 450 mg by mouth daily.     . carvedilol (COREG) 12.5 MG tablet Take 1 tablet (12.5 mg total) by mouth 2 (two) times daily with a meal. Office visit due now 180 tablet 0  . lamoTRIgine (LAMICTAL) 100 MG tablet Take 1 tablet by mouth at bedtime.  2  . methocarbamol (ROBAXIN) 500 MG tablet Take 1 tablet (500 mg total) by mouth 3 (three) times daily as needed for muscle spasms. 60 tablet 0  . ondansetron (ZOFRAN) 4 MG tablet Take 1 tablet (4 mg total) by mouth every 8 (eight) hours as needed for nausea or vomiting. 20 tablet 0  . oxyCODONE-acetaminophen (PERCOCET) 10-325 MG tablet Take 1 tablet by mouth every 4 (four) hours as needed for pain. 60 tablet 0   . pseudoephedrine-acetaminophen (TYLENOL SINUS) 30-500 MG TABS Take 1 tablet by mouth every 4 (four) hours as needed (Seasonal Allergies).    Marland Kitchen spironolactone (ALDACTONE) 25 MG tablet Take 25 mg by mouth daily.    . SUMAtriptan (IMITREX) 50 MG tablet as directed. TAKE 1 TABLET BY MOUTH AS NEEDED    . meloxicam (MOBIC) 15 MG tablet Take 1 tablet by mouth daily as needed. For swelling  0  . montelukast (SINGULAIR) 10 MG tablet TAKE ONE TABLET BY MOUTH DAILY (Patient not taking: Reported on 10/12/2016) 30 tablet 5   No current facility-administered medications for this visit.      Past Medical History:  Diagnosis Date  . Anxiety   . Arthritis   . Asthma   . Bipolar disorder ([place])   . Chronic fatigue   . Depression   . History of blood transfusion 2003  . Hypertension   . Pneumonia    couple of times last being last year  . PONV (postoperative nausea and vomiting)   . Seasonal allergies   . Spinal headache     ROS:   All systems reviewed and negative except as noted in the HPI.   Past Surgical History:  Procedure Laterality Date  . ABDOMINAL HYSTERECTOMY  2004   and USO for endometriosis  . ANTERIOR CERVICAL DECOMP/DISCECTOMY FUSION N/A 09/23/2015   Procedure: ANTERIOR CERVICAL DISCECTOMY FUSION C4-7     (  3 LEVELS);  Surgeon: Melina Schools, MD;  Location: [facility];  Service: Orthopedics;  Laterality: N/A;  . APPENDECTOMY  1988  . BREAST ENHANCEMENT SURGERY Bilateral 2008  . COLONOSCOPY W/ POLYPECTOMY  2013   Dr Fuller Plan  . KNEE ARTHROSCOPY WITH ANTERIOR CRUCIATE LIGAMENT (ACL) REPAIR Right 01/26/2013  . LUMBAR LAMINECTOMY  2008  . TONSILLECTOMY  2009  . TUBAL LIGATION     x4 for endometriosis     Family History  Problem Relation Age of Onset  . Colon cancer Brother 78  . Stomach cancer Maternal Uncle 60  . Colon cancer Paternal Uncle 64  . Colon cancer Maternal Grandmother 36  . Sleep apnea Father   . CVA Father   . Sleep apnea Sister   . Hypertension Sister   .  Coronary artery disease Paternal Aunt   . Pancreatic cancer Paternal Uncle   . CVA Mother     x3  . Hypertension Mother   . Arthritis Mother   . Breast cancer Mother   . Heart Problems Mother     pacemaker  . Heart attack Maternal Uncle   . Clotting disorder Brother   . Hypertension Sister      Social History   Social History  . Marital status: Married    Spouse name: N/A  . Number of children: N/A  . Years of education: N/A   Occupational History  . Not on file.   Social History Main Topics  . Smoking status: Former Smoker    Quit date: 12/26/1986  . Smokeless tobacco: Never Used     Comment: up to 1& 1/2packs /week  . Alcohol use 0.0 oz/week     Comment: socially  . Drug use: No  . Sexual activity: Not on file   Other Topics Concern  . Not on file   Social History Narrative  . No narrative on file     BP 130/88   Pulse 90   Ht 5\' 5"  (1.651 m)   Wt 157 lb (71.2 kg)   BMI 26.13 kg/m   Physical Exam:  Well appearing 55 year old woman, NAD HEENT: Unremarkable Neck:  6 cm JVD, no thyromegally Back:  No CVA tenderness Lungs:  Clear with no wheezes, rales, or rhonchi. HEART:  Regular rate rhythm, no murmurs, no rubs, no clicks Abd:  soft, positive bowel sounds, no organomegally, no rebound, no guarding Ext:  2 plus pulses, no edema, no cyanosis, no clubbing Skin:  No rashes no nodules Neuro:  CN II through XII intact, motor grossly intact  EKG - normal sinus rhythm with normal axis and intervals   Assess/Plan: 1. Chest pain - her symptoms are better and her exercise test was negative. I have reassured her.  2. HTN - her blood pressure is better. I would like her to continue her coreg to 18.75 bid. Will restart her on aldactone. 3. Anxiety - this seems to be fairly well controlled. Will follow.  Mikle Bosworth.D.

## 2016-12-05 ENCOUNTER — Other Ambulatory Visit: Payer: Self-pay | Admitting: Family Medicine

## 2016-12-05 DIAGNOSIS — I10 Essential (primary) hypertension: Secondary | ICD-10-CM

## 2017-02-06 ENCOUNTER — Other Ambulatory Visit (HOSPITAL_COMMUNITY): Payer: Self-pay | Admitting: Orthopedic Surgery

## 2017-02-06 ENCOUNTER — Encounter: Payer: Self-pay | Admitting: Gastroenterology

## 2017-02-06 DIAGNOSIS — R52 Pain, unspecified: Secondary | ICD-10-CM

## 2017-02-07 ENCOUNTER — Ambulatory Visit (HOSPITAL_COMMUNITY)
Admission: RE | Admit: 2017-02-07 | Discharge: 2017-02-07 | Disposition: A | Discharge status: Home / Self Care | Payer: BLUE CROSS/BLUE SHIELD | Source: Ambulatory Visit | Attending: Cardiovascular Disease | Admitting: Cardiovascular Disease

## 2017-02-07 DIAGNOSIS — R52 Pain, unspecified: Secondary | ICD-10-CM | POA: Insufficient documentation

## 2017-02-07 DIAGNOSIS — M79604 Pain in right leg: Secondary | ICD-10-CM | POA: Diagnosis not present

## 2017-02-09 DIAGNOSIS — F3181 Bipolar II disorder: Secondary | ICD-10-CM | POA: Diagnosis not present

## 2017-02-18 ENCOUNTER — Other Ambulatory Visit: Payer: Self-pay | Admitting: Family Medicine

## 2017-02-18 DIAGNOSIS — I10 Essential (primary) hypertension: Secondary | ICD-10-CM

## 2017-02-24 ENCOUNTER — Telehealth: Payer: Self-pay | Admitting: *Deleted

## 2017-02-24 DIAGNOSIS — I10 Essential (primary) hypertension: Secondary | ICD-10-CM

## 2017-02-24 NOTE — Telephone Encounter (Signed)
Patient left a msg on the refill vm stating that Dr Lovena Le wrote out rx's for spironolactone and carvedilol at her last office visit and faxed them to [facility]. Per [facility], they never received these. The carvedilol was last sent in by patients pcp. Per current med list patient takes 12.5 mg bid, but per documentation from Dr Lovena Le, he would like her to continue carvedilol 18.75 mg bid. Spironolactone is listed under discontinued medications with a reason of dose change. Please advise. Thanks, MI

## 2017-02-28 NOTE — Telephone Encounter (Signed)
18.375 bid of coreg would be her dose. I thought spironolactone was to be discontinued. GT

## 2017-03-03 DIAGNOSIS — S46811A Strain of other muscles, fascia and tendons at shoulder and upper arm level, right arm, initial encounter: Secondary | ICD-10-CM | POA: Diagnosis not present

## 2017-03-09 MED ORDER — SPIRONOLACTONE 25 MG PO TABS: 25.0000 mg | ORAL_TABLET | Freq: Every day | ORAL | 3 refills | Status: DC

## 2017-03-09 MED ORDER — CARVEDILOL 12.5 MG PO TABS: 18.7500 mg | ORAL_TABLET | Freq: Two times a day (BID) | ORAL | 3 refills | Status: DC

## 2017-03-09 NOTE — Telephone Encounter (Signed)
Assess/Plan: 1. Chest pain - her symptoms are better and her exercise test was negative. I have reassured her.  2. HTN - her blood pressure is better. I would like her to continue her coreg to 18.75 bid. Will restart her on aldactone. 3. Anxiety - this seems to be fairly well controlled. Will follow.  Gregg Taylor,M.D.  I have sent in both prescriptions per Dr Tanna Furry note.

## 2017-03-13 DIAGNOSIS — S46811A Strain of other muscles, fascia and tendons at shoulder and upper arm level, right arm, initial encounter: Secondary | ICD-10-CM | POA: Diagnosis not present

## 2017-03-15 DIAGNOSIS — Z01419 Encounter for gynecological examination (general) (routine) without abnormal findings: Secondary | ICD-10-CM | POA: Diagnosis not present

## 2017-03-15 DIAGNOSIS — Z6826 Body mass index (BMI) 26.0-26.9, adult: Secondary | ICD-10-CM | POA: Diagnosis not present

## 2017-03-15 DIAGNOSIS — E041 Nontoxic single thyroid nodule: Secondary | ICD-10-CM | POA: Diagnosis not present

## 2017-03-16 ENCOUNTER — Other Ambulatory Visit: Payer: Self-pay | Admitting: Obstetrics and Gynecology

## 2017-03-16 ENCOUNTER — Encounter: Payer: Self-pay | Admitting: Gastroenterology

## 2017-03-16 DIAGNOSIS — E041 Nontoxic single thyroid nodule: Secondary | ICD-10-CM

## 2017-03-20 DIAGNOSIS — S46011D Strain of muscle(s) and tendon(s) of the rotator cuff of right shoulder, subsequent encounter: Secondary | ICD-10-CM | POA: Diagnosis not present

## 2017-03-21 DIAGNOSIS — N941 Unspecified dyspareunia: Secondary | ICD-10-CM | POA: Diagnosis not present

## 2017-03-23 ENCOUNTER — Ambulatory Visit
Admission: RE | Admit: 2017-03-23 | Discharge: 2017-03-23 | Disposition: A | Discharge status: Home / Self Care | Payer: BLUE CROSS/BLUE SHIELD | Source: Ambulatory Visit | Attending: Obstetrics and Gynecology | Admitting: Obstetrics and Gynecology

## 2017-03-23 DIAGNOSIS — E041 Nontoxic single thyroid nodule: Secondary | ICD-10-CM

## 2017-04-04 DIAGNOSIS — S43431A Superior glenoid labrum lesion of right shoulder, initial encounter: Secondary | ICD-10-CM | POA: Diagnosis not present

## 2017-04-04 DIAGNOSIS — G8918 Other acute postprocedural pain: Secondary | ICD-10-CM | POA: Diagnosis not present

## 2017-04-04 DIAGNOSIS — S46091A Other injury of muscle(s) and tendon(s) of the rotator cuff of right shoulder, initial encounter: Secondary | ICD-10-CM | POA: Diagnosis not present

## 2017-04-04 DIAGNOSIS — I890 Lymphedema, not elsewhere classified: Secondary | ICD-10-CM | POA: Diagnosis not present

## 2017-04-04 DIAGNOSIS — S46011D Strain of muscle(s) and tendon(s) of the rotator cuff of right shoulder, subsequent encounter: Secondary | ICD-10-CM | POA: Diagnosis not present

## 2017-04-04 DIAGNOSIS — M19011 Primary osteoarthritis, right shoulder: Secondary | ICD-10-CM | POA: Diagnosis not present

## 2017-04-04 DIAGNOSIS — M75121 Complete rotator cuff tear or rupture of right shoulder, not specified as traumatic: Secondary | ICD-10-CM | POA: Diagnosis not present

## 2017-04-04 DIAGNOSIS — M24111 Other articular cartilage disorders, right shoulder: Secondary | ICD-10-CM | POA: Diagnosis not present

## 2017-04-04 DIAGNOSIS — M7541 Impingement syndrome of right shoulder: Secondary | ICD-10-CM | POA: Diagnosis not present

## 2017-04-04 DIAGNOSIS — S43491D Other sprain of right shoulder joint, subsequent encounter: Secondary | ICD-10-CM | POA: Diagnosis not present

## 2017-04-19 ENCOUNTER — Ambulatory Visit: Payer: BLUE CROSS/BLUE SHIELD | Admitting: Gastroenterology

## 2017-05-02 DIAGNOSIS — I890 Lymphedema, not elsewhere classified: Secondary | ICD-10-CM | POA: Diagnosis not present

## 2017-05-02 DIAGNOSIS — M7541 Impingement syndrome of right shoulder: Secondary | ICD-10-CM | POA: Diagnosis not present

## 2017-05-02 DIAGNOSIS — M19011 Primary osteoarthritis, right shoulder: Secondary | ICD-10-CM | POA: Diagnosis not present

## 2017-05-02 DIAGNOSIS — S46011D Strain of muscle(s) and tendon(s) of the rotator cuff of right shoulder, subsequent encounter: Secondary | ICD-10-CM | POA: Diagnosis not present

## 2017-05-18 DIAGNOSIS — M25611 Stiffness of right shoulder, not elsewhere classified: Secondary | ICD-10-CM | POA: Diagnosis not present

## 2017-05-23 ENCOUNTER — Encounter: Payer: Self-pay | Admitting: Family Medicine

## 2017-05-23 ENCOUNTER — Encounter: Payer: Self-pay | Admitting: Gastroenterology

## 2017-05-23 ENCOUNTER — Ambulatory Visit (INDEPENDENT_AMBULATORY_CARE_PROVIDER_SITE_OTHER): Payer: BLUE CROSS/BLUE SHIELD | Admitting: Family Medicine

## 2017-05-23 VITALS — BP 138/96 | HR 80 | Temp 97.9°F | Resp 16 | Ht 65.0 in | Wt 153.0 lb

## 2017-05-23 DIAGNOSIS — R2233 Localized swelling, mass and lump, upper limb, bilateral: Secondary | ICD-10-CM | POA: Diagnosis not present

## 2017-05-23 DIAGNOSIS — D1723 Benign lipomatous neoplasm of skin and subcutaneous tissue of right leg: Secondary | ICD-10-CM

## 2017-05-23 NOTE — Progress Notes (Signed)
Patient ID: Tammy Randolph, female   DOB: 07/15/1961, 56 y.o.   MRN: [id number]    Subjective:  I acted as a Education administrator for Dr. Carollee Randolph.  Tammy Randolph, [place]   Patient ID: Tammy Randolph, female    DOB: [birth year], 56 y.o.   MRN: [id number]  Chief Complaint  Patient presents with  . lump undernealth right arm  . lump upper right leg    HPI  Patient is in today for lumps underneath both arms and upper right leg, also has cysts underneath both eyes.  The lump underneath right arm she noticed about 1.5 weeks ago.  She had rotator cuff surgery about 6-7 weeks ago and had to wear a sling.  It is a a little tender when she touches it.  She noticed the lump underneath left arm yesterday. Has a lump on right upper leg since last September.  Orthopedic did MRI and stated that it was a mass but of no concern as well as a torn hamstring. She also has cysts underneath both eyes for about a month but has noticed in the past week that they have gotten a little bigger.   Patient Care Team: Tammy Randolph, Tammy Apa, DO as PCP - General (Family Medicine)   Past Medical History:  Diagnosis Date  . Adenomatous colon polyp 1996  . Anxiety   . Arthritis   . Asthma   . Bipolar disorder ([place])   . Chronic fatigue   . Depression   . History of blood transfusion 2003  . Hypertension   . Pneumonia    couple of times last being last year  . PONV (postoperative nausea and vomiting)   . Seasonal allergies   . Spinal headache     Past Surgical History:  Procedure Laterality Date  . ABDOMINAL HYSTERECTOMY  2004   and USO for endometriosis  . ANTERIOR CERVICAL DECOMP/DISCECTOMY FUSION N/A 09/23/2015   Procedure: ANTERIOR CERVICAL DISCECTOMY FUSION C4-7     (3 LEVELS);  Surgeon: Melina Schools, MD;  Location: [facility];  Service: Orthopedics;  Laterality: N/A;  . APPENDECTOMY  1988  . BREAST ENHANCEMENT SURGERY Bilateral 2008  . COLONOSCOPY W/ POLYPECTOMY  2013   Dr Fuller Plan  . KNEE ARTHROSCOPY WITH ANTERIOR CRUCIATE  LIGAMENT (ACL) REPAIR Right 01/26/2013  . LUMBAR LAMINECTOMY  2008  . TONSILLECTOMY  2009  . TUBAL LIGATION     x4 for endometriosis    Family History  Problem Relation Age of Onset  . Colon cancer Brother 69  . Stomach cancer Maternal Uncle 15  . Colon cancer Paternal Uncle 27  . Colon cancer Maternal Grandmother 52  . Sleep apnea Father   . CVA Father   . Sleep apnea Sister   . Hypertension Sister   . Coronary artery disease Paternal Aunt   . Pancreatic cancer Paternal Uncle   . CVA Mother        x3  . Hypertension Mother   . Arthritis Mother   . Breast cancer Mother   . Heart Problems Mother        pacemaker  . Heart attack Maternal Uncle   . Clotting disorder Brother   . Hypertension Sister     Social History   Social History  . Marital status: Married    Spouse name: N/A  . Number of children: N/A  . Years of education: N/A   Occupational History  . Not on file.   Social History Main Topics  . Smoking status: Former  Smoker    Quit date: 12/26/1986  . Smokeless tobacco: Never Used     Comment: up to 1& 1/2packs /week  . Alcohol use 0.0 oz/week     Comment: socially  . Drug use: No  . Sexual activity: Not on file   Other Topics Concern  . Not on file   Social History Narrative  . No narrative on file    Outpatient Medications Prior to Visit  Medication Sig Dispense Refill  . albuterol (PROVENTIL HFA;VENTOLIN HFA) 108 (90 BASE) MCG/ACT inhaler Inhale 1-2 puffs into the lungs every 6 (six) hours as needed for wheezing or shortness of breath.    . amphetamine-dextroamphetamine (ADDERALL XR) 20 MG 24 hr capsule Take 20 mg by mouth daily as needed (concentration).    Marland Kitchen buPROPion (WELLBUTRIN XL) 150 MG 24 hr tablet Take 450 mg by mouth daily.     . carvedilol (COREG) 12.5 MG tablet Take 1.5 tablets (18.75 mg total) by mouth 2 (two) times daily with a meal. Office visit due now 270 tablet 3  . lamoTRIgine (LAMICTAL) 100 MG tablet Take 1 tablet by mouth at  bedtime.  2  . meloxicam (MOBIC) 15 MG tablet Take 1 tablet by mouth daily as needed. For swelling  0  . methocarbamol (ROBAXIN) 500 MG tablet Take 1 tablet (500 mg total) by mouth 3 (three) times daily as needed for muscle spasms. 60 tablet 0  . montelukast (SINGULAIR) 10 MG tablet TAKE ONE TABLET BY MOUTH DAILY 30 tablet 5  . ondansetron (ZOFRAN) 4 MG tablet Take 1 tablet (4 mg total) by mouth every 8 (eight) hours as needed for nausea or vomiting. 20 tablet 0  . oxyCODONE-acetaminophen (PERCOCET) 10-325 MG tablet Take 1 tablet by mouth every 4 (four) hours as needed for pain. 60 tablet 0  . pseudoephedrine-acetaminophen (TYLENOL SINUS) 30-500 MG TABS Take 1 tablet by mouth every 4 (four) hours as needed (Seasonal Allergies).    Marland Kitchen spironolactone (ALDACTONE) 25 MG tablet Take 1 tablet (25 mg total) by mouth daily. 90 tablet 3  . SUMAtriptan (IMITREX) 50 MG tablet as directed. TAKE 1 TABLET BY MOUTH AS NEEDED    . ALPRAZolam (XANAX) 1 MG tablet Take 1 mg by mouth 4 (four) times daily as needed for anxiety.      No facility-administered medications prior to visit.     Allergies  Allergen Reactions  . Hydrocodone-Acetaminophen Itching and Rash  . Morphine Other (See Comments)    Pt did not like the way it made her feel    Review of Systems  Constitutional: Negative for chills, fever and malaise/fatigue.  HENT: Negative for congestion and hearing loss.   Eyes: Negative for discharge.  Respiratory: Negative for cough, sputum production and shortness of breath.   Cardiovascular: Negative for chest pain, palpitations and leg swelling.  Gastrointestinal: Negative for abdominal pain, blood in stool, constipation, diarrhea, heartburn, nausea and vomiting.  Genitourinary: Negative for dysuria, frequency, hematuria and urgency.  Musculoskeletal: Negative for back pain, falls and myalgias.  Skin: Negative for rash.  Neurological: Negative for dizziness, sensory change, loss of consciousness,  weakness and headaches.  Endo/Heme/Allergies: Negative for environmental allergies. Does not bruise/bleed easily.  Psychiatric/Behavioral: Negative for depression and suicidal ideas. The patient is not nervous/anxious and does not have insomnia.        Objective:    Physical Exam  Pulmonary/Chest: Right breast exhibits no inverted nipple, no mass, no nipple discharge, no skin change and no tenderness. Left breast exhibits  no inverted nipple, no mass, no nipple discharge, no skin change and no tenderness.    Skin:     Nursing note and vitals reviewed.   BP (!) 138/96   Pulse 80   Temp 97.9 F (36.6 C) (Oral)   Resp 16   Ht 5\' 5"  (1.651 m)   Wt 153 lb (69.4 kg)   SpO2 96%   BMI 25.46 kg/m  Wt Readings from Last 3 Encounters:  05/23/17 153 lb (69.4 kg)  10/12/16 157 lb (71.2 kg)  09/13/16 157 lb (71.2 kg)   BP Readings from Last 3 Encounters:  05/23/17 (!) 138/96  10/12/16 130/88  09/13/16 (!) 172/118     Immunization History  Administered Date(s) Administered  . Influenza Whole 02/02/2009  . Influenza-Unspecified 08/26/2014  . Td 12/26/1998  . Tdap 08/06/2012    Health Maintenance  Topic Date Due  . Hepatitis C Screening  02-Feb-1961  . HIV Screening  11/29/1978  . MAMMOGRAM  07/26/2016  . COLONOSCOPY  03/21/2017  . INFLUENZA VACCINE  07/26/2017  . PAP SMEAR  04/14/2018  . TETANUS/TDAP  08/06/2022    Lab Results  Component Value Date   WBC 8.7 09/21/2015   HGB 14.3 09/21/2015   HCT 42.7 09/21/2015   PLT 298 09/21/2015   GLUCOSE 94 09/21/2015   CHOL 196 09/04/2015   TRIG 156.0 (H) 09/04/2015   HDL 67.20 09/04/2015   LDLCALC 97 09/04/2015   ALT 21 09/04/2015   AST 16 09/04/2015   NA 140 09/21/2015   K 4.1 09/21/2015   CL 101 09/21/2015   CREATININE 0.92 09/21/2015   BUN 11 09/21/2015   CO2 29 09/21/2015   TSH 1.18 07/30/2012   INR 1.00 12/11/2014    Lab Results  Component Value Date   TSH 1.18 07/30/2012   Lab Results  Component Value  Date   WBC 8.7 09/21/2015   HGB 14.3 09/21/2015   HCT 42.7 09/21/2015   MCV 92.4 09/21/2015   PLT 298 09/21/2015   Lab Results  Component Value Date   NA 140 09/21/2015   K 4.1 09/21/2015   CO2 29 09/21/2015   GLUCOSE 94 09/21/2015   BUN 11 09/21/2015   CREATININE 0.92 09/21/2015   BILITOT 0.4 09/04/2015   ALKPHOS 98 09/04/2015   AST 16 09/04/2015   ALT 21 09/04/2015   PROT 7.8 09/04/2015   ALBUMIN 4.7 09/04/2015   CALCIUM 9.6 09/21/2015   ANIONGAP 10 09/21/2015   GFR 70.86 09/04/2015   Lab Results  Component Value Date   CHOL 196 09/04/2015   Lab Results  Component Value Date   HDL 67.20 09/04/2015   Lab Results  Component Value Date   LDLCALC 97 09/04/2015   Lab Results  Component Value Date   TRIG 156.0 (H) 09/04/2015   Lab Results  Component Value Date   CHOLHDL 3 09/04/2015   No results found for: HGBA1C       Assessment & Plan:   Problem List Items Addressed This Visit    None    Visit Diagnoses    Axillary mass, bilateral    -  Primary   Relevant Orders   MM Digital Diagnostic Bilat   US BREAST COMPLETE UNI LEFT INC AXILLA   US BREAST COMPLETE UNI RIGHT INC AXILLA   CBC with Differential/Platelet   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 Meulemans maintain her montelukast, ALPRAZolam, buPROPion, albuterol, amphetamine-dextroamphetamine, pseudoephedrine-acetaminophen, oxyCODONE-acetaminophen, methocarbamol, ondansetron, SUMAtriptan, lamoTRIgine, meloxicam, carvedilol, spironolactone, diazepam, VIIBRYD, and zolpidem.  Meds ordered this encounter  Medications  . diazepam (VALIUM) 5 MG tablet    Sig: TK 1/2 TO 1 T PO Q 8 H PRF SPASMS OR SLEEP    Refill:  1  . VIIBRYD 20 MG TABS    Sig: TK 1 T PO QHS    Refill:  4  . zolpidem (AMBIEN) 10 MG tablet    Sig: TK 1 T PO QD HS     Refill:  4    CMA served as scribe during this visit. History, Physical and Plan performed by medical provider. Documentation and orders reviewed and attested to.  Ann Held, DO

## 2017-05-23 NOTE — Patient Instructions (Signed)
Lipoma A lipoma is a noncancerous (benign) tumor that is made up of fat cells. This is a very common type of soft-tissue growth. Lipomas are usually found under the skin (subcutaneous). They may occur in any tissue of the body that contains fat. Common areas for lipomas to appear include the back, shoulders, buttocks, and thighs. Lipomas grow slowly, and they are usually painless. Most lipomas do not cause problems and do not require treatment. What are the causes? The cause of this condition is not known. What increases the risk? This condition is more likely to develop in:  People who are 30-85 years old.  People who have a family history of lipomas. What are the signs or symptoms? A lipoma usually appears as a small, round bump under the skin. It may feel soft or rubbery, but the firmness can vary. Most lipomas are not painful. However, a lipoma may become painful if it is located in an area where it pushes on nerves. How is this diagnosed? A lipoma can usually be diagnosed with a physical exam. You may also have tests to confirm the diagnosis and to rule out other conditions. Tests may include:  Imaging tests, such as a CT scan or MRI.  Removal of a tissue sample to be looked at under a microscope (biopsy). How is this treated? Treatment is not needed for small lipomas that are not causing problems. If a lipoma continues to get bigger or it causes problems, removal is often the best option. Lipomas can also be removed to improve appearance. Removal of a lipoma is usually done with a surgery in which the fatty cells and the surrounding capsule are removed. Most often, a medicine that numbs the area (local anesthetic) is used for this procedure. Follow these instructions at home:  Keep all follow-up visits as directed by your health care provider. This is important. Contact a health care provider if:  Your lipoma becomes larger or hard.  Your lipoma becomes painful, red, or increasingly  swollen. These could be signs of infection or a more serious condition. This information is not intended to replace advice given to you by your health care provider. Make sure you discuss any questions you have with your health care provider. Document Released: 12/02/2002 Document Revised: 05/19/2016 Document Reviewed: 12/08/2014 Elsevier Interactive Patient Education  2017 [employer].

## 2017-05-24 ENCOUNTER — Telehealth: Payer: Self-pay | Admitting: *Deleted

## 2017-05-24 DIAGNOSIS — D2311 Other benign neoplasm of skin of right eyelid, including canthus: Secondary | ICD-10-CM | POA: Diagnosis not present

## 2017-05-24 LAB — CBC WITH DIFFERENTIAL/PLATELET
Basophils Absolute: 0 10*3/uL (ref 0.0–0.1)
Basophils Relative: 0.7 % (ref 0.0–3.0)
EOS PCT: 4.4 % (ref 0.0–5.0)
Eosinophils Absolute: 0.2 10*3/uL (ref 0.0–0.7)
HEMATOCRIT: 43 % (ref 36.0–46.0)
HEMOGLOBIN: 14.4 g/dL (ref 12.0–15.0)
LYMPHS ABS: 1 10*3/uL (ref 0.7–4.0)
LYMPHS PCT: 17.8 % (ref 12.0–46.0)
MCHC: 33.5 g/dL (ref 30.0–36.0)
MCV: 89.3 fl (ref 78.0–100.0)
Monocytes Absolute: 0.6 10*3/uL (ref 0.1–1.0)
Monocytes Relative: 11.9 % (ref 3.0–12.0)
Neutro Abs: 3.6 10*3/uL (ref 1.4–7.7)
Neutrophils Relative %: 65.2 % (ref 43.0–77.0)
Platelets: 311 10*3/uL (ref 150.0–400.0)
RBC: 4.81 Mil/uL (ref 3.87–5.11)
RDW: 13.2 % (ref 11.5–15.5)
WBC: 5.4 10*3/uL (ref 4.0–10.5)

## 2017-05-24 NOTE — Telephone Encounter (Signed)
Received MRI of right thigh.  Per Dr. Etter Sjogren it is positive for lipoma.  We can refer to surgery if patient would like otherwise she can all [place] if if increase size or causes pain.  Patient notified and she will wait and call us if the size increases or causes pain.

## 2017-05-25 DIAGNOSIS — M25611 Stiffness of right shoulder, not elsewhere classified: Secondary | ICD-10-CM | POA: Diagnosis not present

## 2017-05-29 DIAGNOSIS — N390 Urinary tract infection, site not specified: Secondary | ICD-10-CM | POA: Diagnosis not present

## 2017-06-06 DIAGNOSIS — M25611 Stiffness of right shoulder, not elsewhere classified: Secondary | ICD-10-CM | POA: Diagnosis not present

## 2017-06-22 ENCOUNTER — Encounter: Payer: Self-pay | Admitting: Family Medicine

## 2017-06-22 DIAGNOSIS — M25611 Stiffness of right shoulder, not elsewhere classified: Secondary | ICD-10-CM | POA: Diagnosis not present

## 2017-06-22 DIAGNOSIS — N6332 Unspecified lump in axillary tail of the left breast: Secondary | ICD-10-CM | POA: Diagnosis not present

## 2017-06-22 DIAGNOSIS — H5203 Hypermetropia, bilateral: Secondary | ICD-10-CM | POA: Diagnosis not present

## 2017-06-22 DIAGNOSIS — H524 Presbyopia: Secondary | ICD-10-CM | POA: Diagnosis not present

## 2017-06-22 DIAGNOSIS — N6002 Solitary cyst of left breast: Secondary | ICD-10-CM | POA: Diagnosis not present

## 2017-06-22 DIAGNOSIS — N6331 Unspecified lump in axillary tail of the right breast: Secondary | ICD-10-CM | POA: Diagnosis not present

## 2017-06-23 ENCOUNTER — Ambulatory Visit (AMBULATORY_SURGERY_CENTER): Payer: Self-pay

## 2017-06-23 VITALS — Ht 65.0 in | Wt 150.0 lb

## 2017-06-23 DIAGNOSIS — Z860100 Personal history of colon polyps, unspecified: Secondary | ICD-10-CM

## 2017-06-23 MED ORDER — SUPREP BOWEL PREP KIT 17.5-3.13-1.6 GM/177ML PO SOLN: 1.0000 | Freq: Once | ORAL | 0 refills | Status: AC

## 2017-06-23 NOTE — Progress Notes (Signed)
No allergies to eggs or soy No past problems with anesthesia No diet meds No home oxygen  Registered emmi 

## 2017-07-04 DIAGNOSIS — M25611 Stiffness of right shoulder, not elsewhere classified: Secondary | ICD-10-CM | POA: Diagnosis not present

## 2017-07-05 DIAGNOSIS — H0019 Chalazion unspecified eye, unspecified eyelid: Secondary | ICD-10-CM | POA: Diagnosis not present

## 2017-07-05 DIAGNOSIS — H00029 Hordeolum internum unspecified eye, unspecified eyelid: Secondary | ICD-10-CM | POA: Diagnosis not present

## 2017-07-11 ENCOUNTER — Encounter: Payer: Self-pay | Admitting: Gastroenterology

## 2017-07-12 ENCOUNTER — Encounter: Payer: BLUE CROSS/BLUE SHIELD | Admitting: Gastroenterology

## 2017-07-18 ENCOUNTER — Encounter: Payer: Self-pay | Admitting: Gastroenterology

## 2017-07-18 ENCOUNTER — Ambulatory Visit (AMBULATORY_SURGERY_CENTER): Payer: BLUE CROSS/BLUE SHIELD | Admitting: Gastroenterology

## 2017-07-18 VITALS — BP 131/89 | HR 72 | Temp 98.4°F | Resp 11 | Ht 62.0 in | Wt 140.0 lb

## 2017-07-18 DIAGNOSIS — K921 Melena: Secondary | ICD-10-CM

## 2017-07-18 DIAGNOSIS — K635 Polyp of colon: Secondary | ICD-10-CM

## 2017-07-18 DIAGNOSIS — D124 Benign neoplasm of descending colon: Secondary | ICD-10-CM

## 2017-07-18 DIAGNOSIS — D126 Benign neoplasm of colon, unspecified: Secondary | ICD-10-CM

## 2017-07-18 DIAGNOSIS — Z86010 Personal history of colonic polyps: Secondary | ICD-10-CM | POA: Diagnosis not present

## 2017-07-18 DIAGNOSIS — D120 Benign neoplasm of cecum: Secondary | ICD-10-CM

## 2017-07-18 MED ORDER — SODIUM CHLORIDE 0.9 % IV SOLN: 500.0000 mL | INTRAVENOUS | Status: AC

## 2017-07-18 NOTE — Op Note (Signed)
[facility] Patient Name: Tammy Randolph Procedure Date: 07/18/2017 8:41 AM MRN: [id number] Endoscopist: Ladene Artist , MD Age: 56 Referring MD:  Date of Birth: 1961-03-17 Gender: Female Account #: [id number] Procedure:                Colonoscopy Indications:              Hematochezia. Family history of colon cancer. Medicines:                Monitored Anesthesia Care Procedure:                Pre-Anesthesia Assessment:                           - Prior to the procedure, a History and Physical                            was performed, and patient medications and                            allergies were reviewed. The patient's tolerance of                            previous anesthesia was also reviewed. The risks                            and benefits of the procedure and the sedation                            options and risks were discussed with the patient.                            All questions were answered, and informed consent                            was obtained. Prior Anticoagulants: The patient has                            taken no previous anticoagulant or antiplatelet                            agents. ASA Grade Assessment: II - A patient with                            mild systemic disease. After reviewing the risks                            and benefits, the patient was deemed in                            satisfactory condition to undergo the procedure.                           After obtaining informed consent, the colonoscope  was passed under direct vision. Throughout the                            procedure, the patient's blood pressure, pulse, and                            oxygen saturations were monitored continuously. The                            Colonoscope was introduced through the anus and                            advanced to the the cecum, identified by                            appendiceal orifice and  ileocecal valve. The                            ileocecal valve, appendiceal orifice, and rectum                            were photographed. The quality of the bowel                            preparation was excellent. The colonoscopy was                            performed without difficulty. The patient tolerated                            the procedure well. Scope In: 8:50:08 AM Scope Out: 9:01:19 AM Scope Withdrawal Time: 0 hours 9 minutes 27 seconds  Total Procedure Duration: 0 hours 11 minutes 11 seconds  Findings:                 The perianal and digital rectal examinations were                            normal.                           Two sessile polyps were found in the ileocecal                            valve. The polyps were 6 mm in size. These polyps                            were removed with a cold snare. Resection and                            retrieval were complete.                           Multiple medium-mouthed diverticula were found in  the left colon. There was no evidence of                            diverticular bleeding.                           Three sessile polyps were found in the descending                            colon. The polyps were 5 to 8 mm in size. These                            polyps were removed with a cold snare. Resection                            and retrieval were complete.                           Internal hemorrhoids and external hemorrhoids were                            found during retroflexion. The hemorrhoids were                            small and Grade I (internal hemorrhoids that do not                            prolapse).                           The exam was otherwise without abnormality on                            direct and retroflexion views. Complications:            No immediate complications. Estimated blood loss:                            None. Estimated Blood Loss:      Estimated blood loss: none. Impression:               - Two 6 mm polyps at the ileocecal valve, removed                            with a cold snare. Resected and retrieved.                           - Three 5 to 8 mm polyps in the descending colon,                            removed with a cold snare. Resected and retrieved.                           - Mild diverticulosis in the left colon. There was  no evidence of diverticular bleeding.                           - Internal and external hemorrhoids Recommendation:           - Repeat colonoscopy in 3 - 5 years for                            surveillance pending pathology review.                           - Patient has a contact number available for                            emergencies. The signs and symptoms of potential                            delayed complications were discussed with the                            patient. Return to normal activities tomorrow.                            Written discharge instructions were provided to the                            patient.                           - High fiber diet.                           - Continue present medications.                           - Await pathology results. Ladene Artist, MD 07/18/2017 9:09:29 AM This report has been signed electronically.

## 2017-07-18 NOTE — Progress Notes (Signed)
Called to room to assist during endoscopic procedure.  Patient ID and intended procedure confirmed with present staff. Received instructions for my participation in the procedure from the performing physician.  

## 2017-07-18 NOTE — Patient Instructions (Addendum)
**  Handouts given on Polyps, hemorrhoids, diverticulosis and high fiber diet**  YOU HAD AN ENDOSCOPIC PROCEDURE TODAY: Refer to the procedure report and other information in the discharge instructions given to you for any specific questions about what was found during the examination. If this information does not answer your questions, please call [place] office at [phone] to clarify.   YOU SHOULD EXPECT: Some feelings of bloating in the abdomen. Passage of more gas than usual. Walking can help get rid of the air that was put into your GI tract during the procedure and reduce the bloating. If you had a lower endoscopy (such as a colonoscopy or flexible sigmoidoscopy) you may notice spotting of blood in your stool or on the toilet paper. Some abdominal soreness may be present for a day or two, also.  DIET: Your first meal following the procedure should be a light meal and then it is ok to progress to your normal diet. A half-sandwich or bowl of soup is an example of a good first meal. Heavy or fried foods are harder to digest and may make you feel nauseous or bloated. Drink plenty of fluids but you should avoid alcoholic beverages for 24 hours. If you had a esophageal dilation, please see attached instructions for diet.    ACTIVITY: Your care partner should take you home directly after the procedure. You should plan to take it easy, moving slowly for the rest of the day. You can resume normal activity the day after the procedure however YOU SHOULD NOT DRIVE, use power tools, machinery or perform tasks that involve climbing or major physical exertion for 24 hours (because of the sedation medicines used during the test).   SYMPTOMS TO REPORT IMMEDIATELY: A gastroenterologist can be reached at any hour. Please call [phone]  for any of the following symptoms:  Following lower endoscopy (colonoscopy, flexible sigmoidoscopy) Excessive amounts of blood in the stool  Significant tenderness, worsening  of abdominal pains  Swelling of the abdomen that is new, acute  Fever of 100 or higher    FOLLOW UP:  If any biopsies were taken you will be contacted by phone or by letter within the next 1-3 weeks. Call [phone]  if you have not heard about the biopsies in 3 weeks.  Please also call with any specific questions about appointments or follow up tests.

## 2017-07-18 NOTE — Progress Notes (Signed)
Report given to PACU, vss 

## 2017-07-19 ENCOUNTER — Telehealth: Payer: Self-pay

## 2017-07-19 NOTE — Telephone Encounter (Signed)
  Follow up Call-  Call back number 07/18/2017  Post procedure Call Back phone  # [phone]  Permission to leave phone message Yes  Some recent data might be hidden     Patient questions:  Do you have a fever, pain , or abdominal swelling? No. Pain Score  0 *  Have you tolerated food without any problems? Yes.    Have you been able to return to your normal activities? Yes.    Do you have any questions about your discharge instructions: Diet   No. Medications  No. Follow up visit  No.  Do you have questions or concerns about your Care? No.  Actions: * If pain score is 4 or above: No action needed, pain <4.

## 2017-07-24 ENCOUNTER — Encounter: Payer: Self-pay | Admitting: Gastroenterology

## 2017-08-01 DIAGNOSIS — S46011D Strain of muscle(s) and tendon(s) of the rotator cuff of right shoulder, subsequent encounter: Secondary | ICD-10-CM | POA: Diagnosis not present

## 2017-08-07 DIAGNOSIS — S46011D Strain of muscle(s) and tendon(s) of the rotator cuff of right shoulder, subsequent encounter: Secondary | ICD-10-CM | POA: Diagnosis not present

## 2017-08-09 IMAGING — US US THYROID
1 series · 13 of 25 positions shown · non-contrast
Comparison: None.

CLINICAL DATA: Palpable abnormality. Left thyroid nodule by
physical exam.

EXAM:
THYROID ULTRASOUND
TECHNIQUE: Ultrasound examination of the thyroid gland and adjacent soft
tissues was performed.

[Series 1: us thyroid · 0.04mm/px · 13 of 49 slices shown]
[im 1/49]
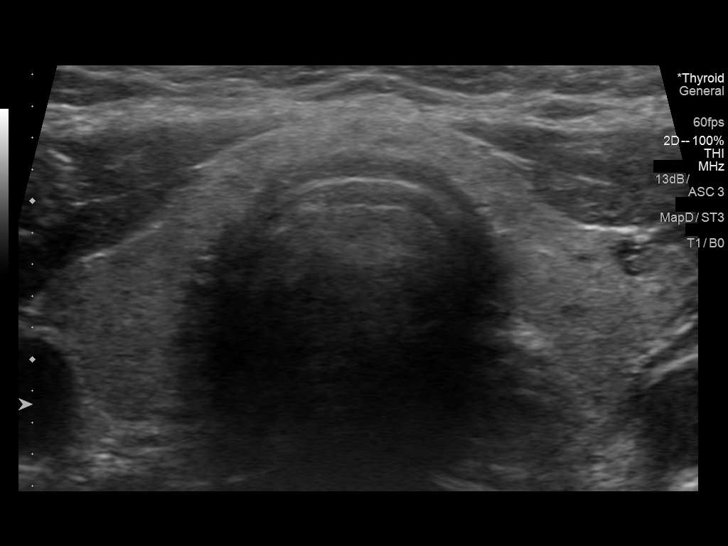
[im 5/49]
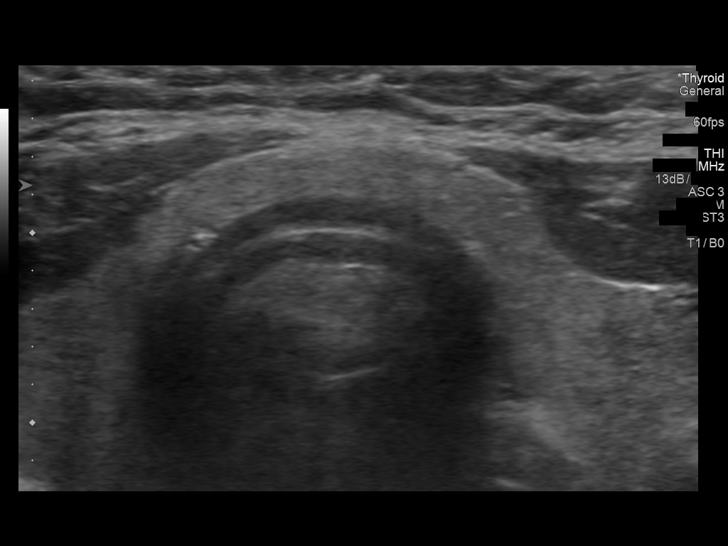
[im 9/49]
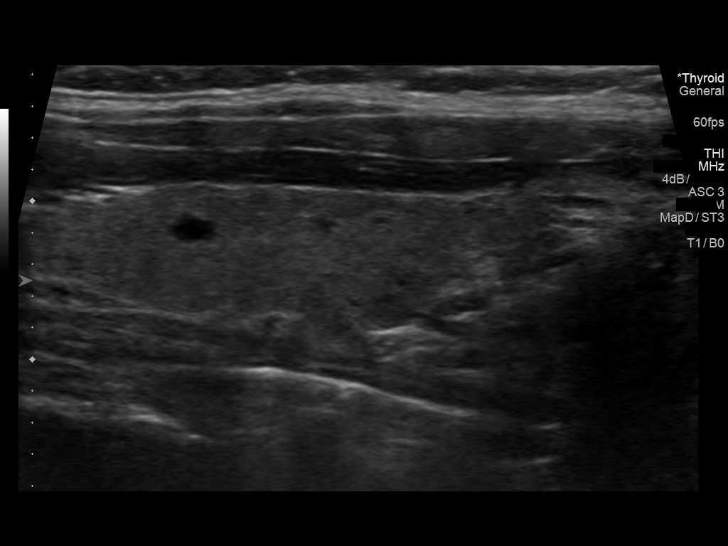
[im 13/49]
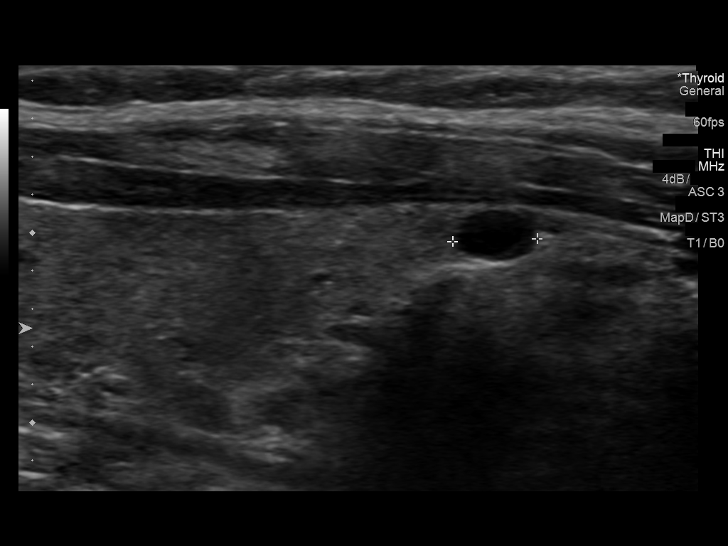
[im 17/49]
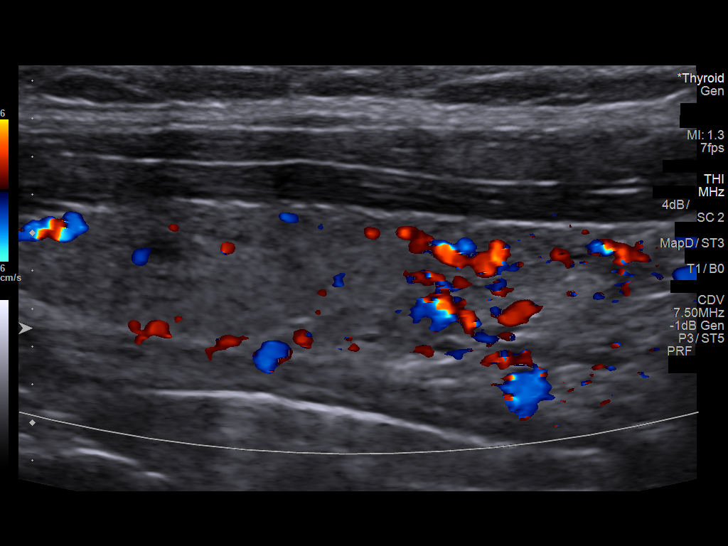
[im 21/49]
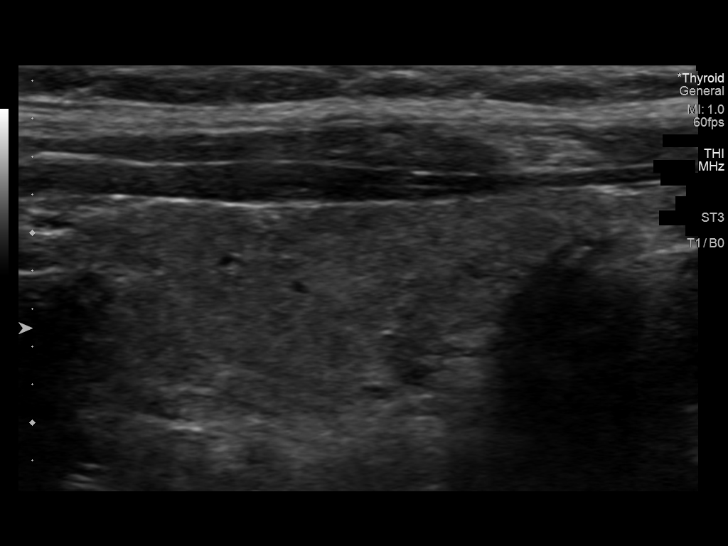
[im 25/49]
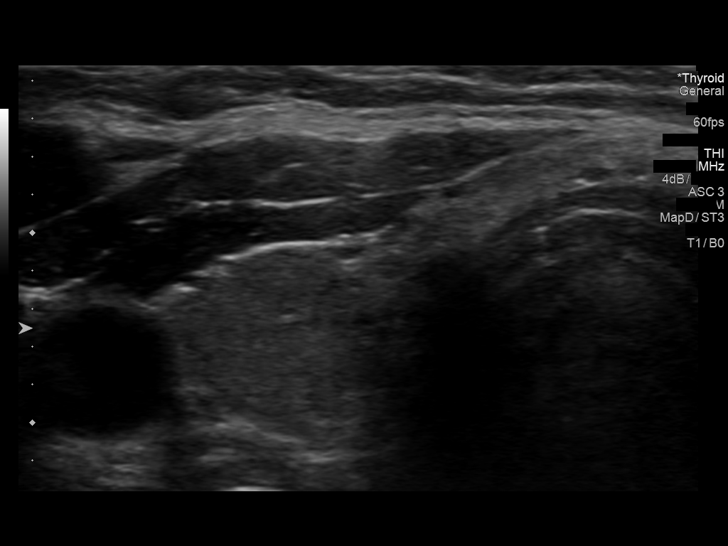
[im 29/49]
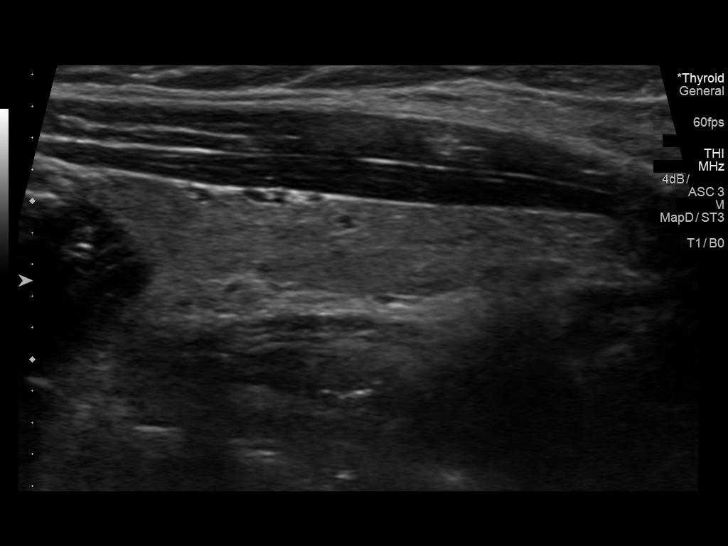
[im 33/49]
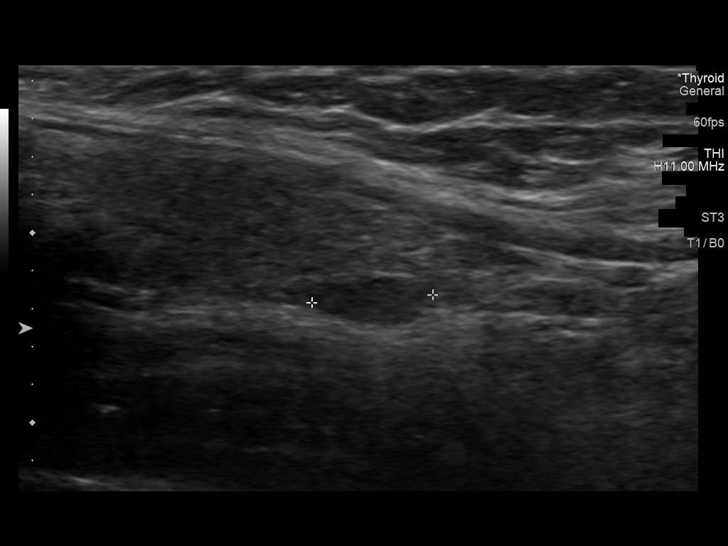
[im 37/49]
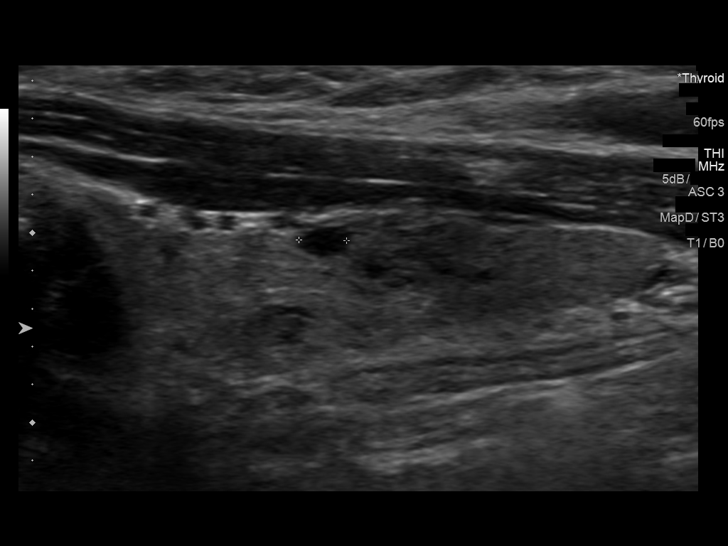
[im 41/49]
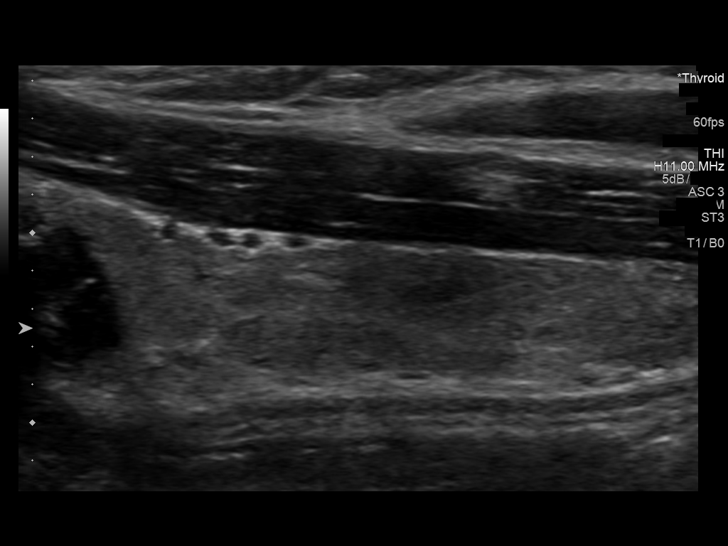
[im 45/49]
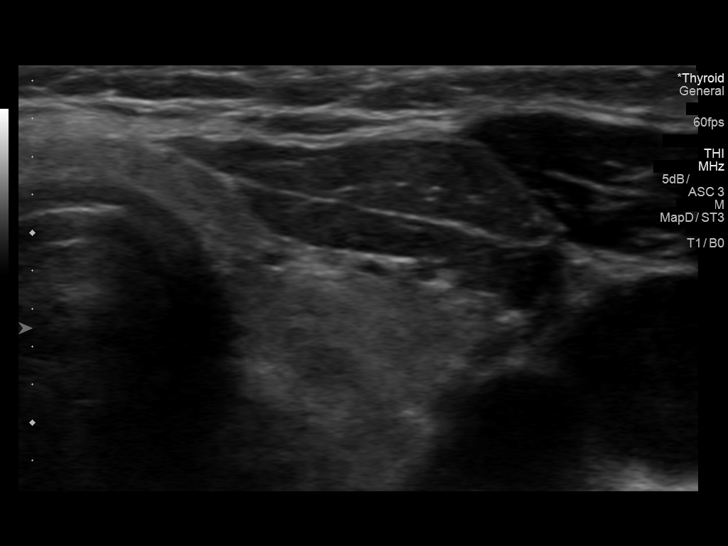
[im 49/49]
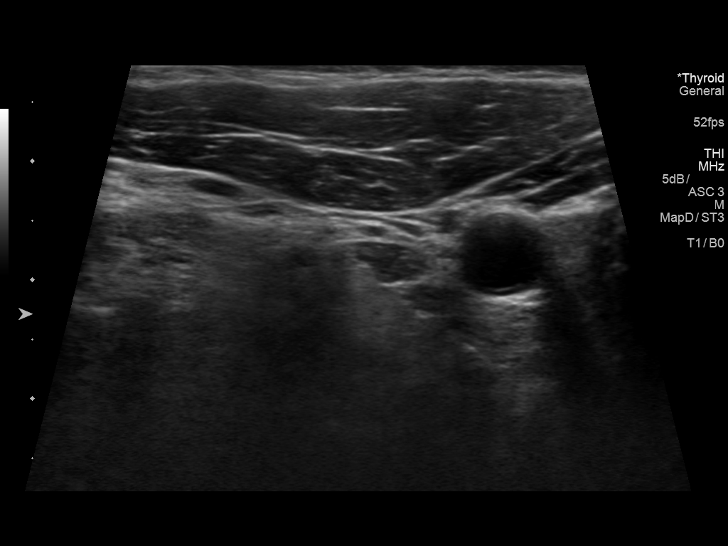

[13 of 25 positions shown; findings below may reference images not displayed]

FINDINGS: Parenchymal Echotexture: Mildly heterogenous

Isthmus: 0.3 cm

Right lobe: 3.6 x 1.0 x 1.2 cm

Left lobe: 4.1 x 1.0 x 1.5 cm

_________________________________________________________

Estimated total number of nodules >/= 1 cm: 1

Number of spongiform nodules >/=  2 cm not described below (TR1): 0

Number of mixed cystic and solid nodules >/= 1.5 cm not described
below (TR2): 0

_________________________________________________________

Nodule # 1:

Location: Isthmus; Mid

Maximum size: 1.2 cm; Other 2 dimensions: 0.5 x 0.9 cm

Composition: solid/almost completely solid (2)

Echogenicity: hypoechoic (2)

Shape: not taller-than-wide (0)

Margins: smooth (0)

Echogenic foci: none (0)

ACR TI-RADS total points: 4.

ACR TI-RADS risk category: TR4 (4-6 points).

ACR TI-RADS recommendations:

*Given size (>/= 1 - 1.4 cm) and appearance, a follow-up ultrasound
in 1 year should be considered based on TI-RADS criteria.

_________________________________________________________

Tiny left lobe nodules have a benign appearance. They measure 0.6 cm
or less. No adenopathy.
IMPRESSION: There are nodules in the isthmus and left lobe. The nodule in the
isthmus measures 1.2 cm and meets criteria for annual follow-up.

The above is in keeping with the ACR TI-RADS recommendations - [HOSPITAL] 2382;[DATE].

## 2017-08-14 DIAGNOSIS — F3181 Bipolar II disorder: Secondary | ICD-10-CM | POA: Diagnosis not present

## 2017-08-22 DIAGNOSIS — M66821 Spontaneous rupture of other tendons, right upper arm: Secondary | ICD-10-CM | POA: Diagnosis not present

## 2017-08-22 DIAGNOSIS — Z4789 Encounter for other orthopedic aftercare: Secondary | ICD-10-CM | POA: Diagnosis not present

## 2017-08-22 DIAGNOSIS — M24111 Other articular cartilage disorders, right shoulder: Secondary | ICD-10-CM | POA: Diagnosis not present

## 2017-08-22 DIAGNOSIS — M659 Synovitis and tenosynovitis, unspecified: Secondary | ICD-10-CM | POA: Diagnosis not present

## 2017-08-22 DIAGNOSIS — G8918 Other acute postprocedural pain: Secondary | ICD-10-CM | POA: Diagnosis not present

## 2017-08-22 DIAGNOSIS — S46091A Other injury of muscle(s) and tendon(s) of the rotator cuff of right shoulder, initial encounter: Secondary | ICD-10-CM | POA: Diagnosis not present

## 2017-08-22 DIAGNOSIS — S46011D Strain of muscle(s) and tendon(s) of the rotator cuff of right shoulder, subsequent encounter: Secondary | ICD-10-CM | POA: Diagnosis not present

## 2017-08-22 DIAGNOSIS — S46011A Strain of muscle(s) and tendon(s) of the rotator cuff of right shoulder, initial encounter: Secondary | ICD-10-CM | POA: Diagnosis not present

## 2017-08-22 DIAGNOSIS — R600 Localized edema: Secondary | ICD-10-CM | POA: Diagnosis not present

## 2017-08-22 DIAGNOSIS — M25511 Pain in right shoulder: Secondary | ICD-10-CM | POA: Diagnosis not present

## 2017-09-28 ENCOUNTER — Other Ambulatory Visit: Payer: Self-pay | Admitting: Internal Medicine

## 2017-09-28 DIAGNOSIS — I10 Essential (primary) hypertension: Secondary | ICD-10-CM

## 2017-09-29 NOTE — Telephone Encounter (Signed)
Medication Detail    Disp Refills Start End   spironolactone (ALDACTONE) 25 MG tablet 90 tablet 3 03/09/2017    Sig - Route: Take 1 tablet (25 mg total) by mouth daily. - Oral   Sent to pharmacy as: spironolactone (ALDACTONE) 25 MG tablet   E-Prescribing Status: Receipt confirmed by pharmacy (03/09/2017 12:58 PM EDT)   Pharmacy   [id number] - [place], [address] AT [place]

## 2017-10-06 DIAGNOSIS — M2580 Other specified joint disorders, unspecified joint: Secondary | ICD-10-CM | POA: Diagnosis not present

## 2017-10-06 DIAGNOSIS — M67472 Ganglion, left ankle and foot: Secondary | ICD-10-CM | POA: Diagnosis not present

## 2017-10-06 DIAGNOSIS — M21612 Bunion of left foot: Secondary | ICD-10-CM | POA: Diagnosis not present

## 2017-11-06 DIAGNOSIS — M25611 Stiffness of right shoulder, not elsewhere classified: Secondary | ICD-10-CM | POA: Diagnosis not present

## 2017-11-09 DIAGNOSIS — M25611 Stiffness of right shoulder, not elsewhere classified: Secondary | ICD-10-CM | POA: Diagnosis not present

## 2017-11-13 DIAGNOSIS — M25611 Stiffness of right shoulder, not elsewhere classified: Secondary | ICD-10-CM | POA: Diagnosis not present

## 2017-11-15 DIAGNOSIS — M25611 Stiffness of right shoulder, not elsewhere classified: Secondary | ICD-10-CM | POA: Diagnosis not present

## 2017-11-27 DIAGNOSIS — M25611 Stiffness of right shoulder, not elsewhere classified: Secondary | ICD-10-CM | POA: Diagnosis not present

## 2017-11-29 DIAGNOSIS — Z4789 Encounter for other orthopedic aftercare: Secondary | ICD-10-CM | POA: Diagnosis not present

## 2017-11-29 DIAGNOSIS — S46011D Strain of muscle(s) and tendon(s) of the rotator cuff of right shoulder, subsequent encounter: Secondary | ICD-10-CM | POA: Diagnosis not present

## 2017-12-07 DIAGNOSIS — M25611 Stiffness of right shoulder, not elsewhere classified: Secondary | ICD-10-CM | POA: Diagnosis not present

## 2017-12-11 DIAGNOSIS — N941 Unspecified dyspareunia: Secondary | ICD-10-CM | POA: Diagnosis not present

## 2017-12-11 DIAGNOSIS — R159 Full incontinence of feces: Secondary | ICD-10-CM | POA: Diagnosis not present

## 2017-12-11 DIAGNOSIS — N390 Urinary tract infection, site not specified: Secondary | ICD-10-CM | POA: Diagnosis not present

## 2017-12-11 DIAGNOSIS — R350 Frequency of micturition: Secondary | ICD-10-CM | POA: Diagnosis not present

## 2017-12-11 DIAGNOSIS — R300 Dysuria: Secondary | ICD-10-CM | POA: Diagnosis not present

## 2017-12-12 DIAGNOSIS — M25611 Stiffness of right shoulder, not elsewhere classified: Secondary | ICD-10-CM | POA: Diagnosis not present

## 2017-12-27 ENCOUNTER — Telehealth: Payer: Self-pay | Admitting: Internal Medicine

## 2017-12-27 NOTE — Telephone Encounter (Signed)
New Message   Patient is calling stating that she needs to have an EKG done. She is due to have surgery on Feb. 4 and she can not have surgery until she has an EKG.

## 2017-12-29 NOTE — Telephone Encounter (Signed)
Pt is due for one year follow up. I spoke with her and scheduled appointment for January 16,2019 at 1:45 with Tommye Standard, PA

## 2018-01-01 DIAGNOSIS — Z4889 Encounter for other specified surgical aftercare: Secondary | ICD-10-CM | POA: Diagnosis not present

## 2018-01-01 DIAGNOSIS — M75101 Unspecified rotator cuff tear or rupture of right shoulder, not specified as traumatic: Secondary | ICD-10-CM | POA: Diagnosis not present

## 2018-01-02 DIAGNOSIS — R159 Full incontinence of feces: Secondary | ICD-10-CM | POA: Diagnosis not present

## 2018-01-02 DIAGNOSIS — N390 Urinary tract infection, site not specified: Secondary | ICD-10-CM | POA: Diagnosis not present

## 2018-01-02 DIAGNOSIS — N941 Unspecified dyspareunia: Secondary | ICD-10-CM | POA: Diagnosis not present

## 2018-01-08 DIAGNOSIS — J019 Acute sinusitis, unspecified: Secondary | ICD-10-CM | POA: Diagnosis not present

## 2018-01-08 DIAGNOSIS — R300 Dysuria: Secondary | ICD-10-CM | POA: Diagnosis not present

## 2018-01-08 DIAGNOSIS — R350 Frequency of micturition: Secondary | ICD-10-CM | POA: Diagnosis not present

## 2018-01-09 ENCOUNTER — Encounter: Payer: Self-pay | Admitting: Physician Assistant

## 2018-01-10 ENCOUNTER — Ambulatory Visit: Payer: BLUE CROSS/BLUE SHIELD | Admitting: Physician Assistant

## 2018-01-10 VITALS — BP 134/92 | HR 84 | Ht 62.0 in | Wt 155.0 lb

## 2018-01-10 DIAGNOSIS — I10 Essential (primary) hypertension: Secondary | ICD-10-CM | POA: Diagnosis not present

## 2018-01-10 DIAGNOSIS — Z79899 Other long term (current) drug therapy: Secondary | ICD-10-CM

## 2018-01-10 NOTE — Patient Instructions (Signed)
Medication Instructions:   Your physician recommends that you continue on your current medications as directed. Please refer to the Current Medication list given to you today.   If you need a refill on your cardiac medications before your next appointment, please call your pharmacy.  Labwork:  RETURN FOR FASTING LABS BMET AND LIPIDS    Testing/Procedures: NONE ORDERED  TODAY    Follow-Up:  Your physician wants you to follow-up in: [place] will receive a reminder letter in the mail two months in advance. If you don't receive a letter, please call our office to schedule the follow-up appointment.     Any Other Special Instructions Will Be Listed Below (If Applicable).

## 2018-01-10 NOTE — Progress Notes (Signed)
Cardiology Office Note Date:  01/10/2018  Patient ID:  Seda, Kronberg [birth year], MRN [id number] PCP:  Ann Held, DO  Cardiologist:  Dr. Lovena Le   Chief Complaint:  Annal visit   History of Present Illness: KASAUNDRA FAHRNEY is a 57 y.o. female with history of HTN, anxiety, Biopolar disorder, ADD.   She comes today to be seen for dr. Lovena Le.  She was last seen by him in Oct 2017, at that time, he mentions hx of CP that had been w/u with a neg ETT, and had been quiet.  She feels well, no CP, palpitations, or SOB.  She had 2 rotator cuff surgeries last year and gout out of her usual exercise routine, though denies any exertional intolerances.  No dizziness, near syncope or syncope, tolerating her medicines well.     Past Medical History:  Diagnosis Date  . Adenomatous colon polyp 1996  . Anxiety   . Arthritis   . Asthma   . Bipolar disorder ([place])   . Chronic fatigue   . Depression   . History of blood transfusion 2003  . Hypertension   . Pneumonia    couple of times last being last year  . PONV (postoperative nausea and vomiting)   . Seasonal allergies   . Spinal headache     Past Surgical History:  Procedure Laterality Date  . ABDOMINAL HYSTERECTOMY  2004   and USO for endometriosis  . ANTERIOR CERVICAL DECOMP/DISCECTOMY FUSION N/A 09/23/2015   Procedure: ANTERIOR CERVICAL DISCECTOMY FUSION C4-7     (3 LEVELS);  Surgeon: Melina Schools, MD;  Location: [facility];  Service: Orthopedics;  Laterality: N/A;  . APPENDECTOMY  1988  . BREAST ENHANCEMENT SURGERY Bilateral 2008  . COLONOSCOPY W/ POLYPECTOMY  2013   Dr Fuller Plan  . HYSTERECTOMY ABDOMINAL WITH SALPINGECTOMY     x4 for endometriosis  . KNEE ARTHROSCOPY WITH ANTERIOR CRUCIATE LIGAMENT (ACL) REPAIR Right 01/26/2013  . LUMBAR LAMINECTOMY  2008  . TONSILLECTOMY  2009    Current Outpatient Medications  Medication Sig Dispense Refill  . albuterol (PROVENTIL HFA;VENTOLIN HFA) 108 (90 BASE) MCG/ACT inhaler Inhale  1-2 puffs into the lungs every 6 (six) hours as needed for wheezing or shortness of breath.    . ALPRAZolam (XANAX) 1 MG tablet Take 1 mg by mouth 4 (four) times daily as needed for anxiety.     Marland Kitchen amphetamine-dextroamphetamine (ADDERALL XR) 20 MG 24 hr capsule Take 20 mg by mouth daily as needed (concentration).    Marland Kitchen buPROPion (WELLBUTRIN XL) 150 MG 24 hr tablet Take 450 mg by mouth daily.     . carvedilol (COREG) 12.5 MG tablet Take 1.5 tablets (18.75 mg total) by mouth 2 (two) times daily with a meal. Office visit due now 270 tablet 3  . methocarbamol (ROBAXIN) 500 MG tablet Take 1 tablet (500 mg total) by mouth 3 (three) times daily as needed for muscle spasms. 60 tablet 0  . ondansetron (ZOFRAN) 4 MG tablet Take 1 tablet (4 mg total) by mouth every 8 (eight) hours as needed for nausea or vomiting. 20 tablet 0  . oxyCODONE-acetaminophen (PERCOCET) 10-325 MG tablet Take 1 tablet by mouth every 4 (four) hours as needed for pain. 60 tablet 0  . pseudoephedrine-acetaminophen (TYLENOL SINUS) 30-500 MG TABS Take 1 tablet by mouth every 4 (four) hours as needed (Seasonal Allergies).    Marland Kitchen spironolactone (ALDACTONE) 25 MG tablet Take 1 tablet (25 mg total) by mouth daily. 90 tablet 3  .  SUMAtriptan (IMITREX) 50 MG tablet as directed. TAKE 1 TABLET BY MOUTH AS NEEDED    . VIIBRYD 20 MG TABS TK 1 T PO QHS  4  . zolpidem (AMBIEN) 10 MG tablet TK 1 T PO QD HS  4   Current Facility-Administered Medications  Medication Dose Route Frequency Provider Last Rate Last Dose  . 0.9 %  sodium chloride infusion  500 mL Intravenous Continuous Ladene Artist, MD        Allergies:   Hydrocodone-acetaminophen and Morphine   Social History:  The patient  reports that she quit smoking about 31 years ago. she has never used smokeless tobacco. She reports that she drinks alcohol. She reports that she does not use drugs.   Family History:  The patient's family history includes Arthritis in her mother; Breast cancer in  her mother; CVA in her father and mother; Clotting disorder in her brother; Colon cancer (age of onset: 92) in her brother and maternal grandmother; Colon cancer (age of onset: 33) in her paternal uncle; Colon polyps in her brother; Coronary artery disease in her paternal aunt; Heart Problems in her mother; Heart attack in her maternal uncle; Hypertension in her mother, sister, and sister; Pancreatic cancer in her paternal uncle; Sleep apnea in her father and sister; Stomach cancer (age of onset: 30) in her maternal uncle.  ROS:  Please see the history of present illness.    All other systems are reviewed and otherwise negative.   PHYSICAL EXAM: VS:  BP (!) 134/92   Pulse 84   Ht 5\' 2"  (1.575 m)   Wt 155 lb (70.3 kg)   BMI 28.35 kg/m  BMI: Body mass index is 28.35 kg/m. Well nourished, well developed, in no acute distress  HEENT: normocephalic, atraumatic  Neck: no JVD, carotid bruits or masses Cardiac:  RRR; no significant murmurs, no rubs, or gallops Lungs:  CTA b/l, no wheezing, rhonchi or rales  Abd: soft, nontender MS: no deformity, no atrophy Ext: no edema  Skin: warm and dry, no rash Neuro:  No gross deficits appreciated Psych: euthymic mood, full affect     EKG:  Done today and reviewed by myself is SR 84bpm   09/21/16: ETT  Blood pressure demonstrated a hypertensive response to exercise.  There was no ST segment deviation noted during stress. Good exercise capacity. Hypertensive response to exercise (193/100 mmHg). No ischemia.    Recent Labs: 05/23/2017: Hemoglobin 14.4; Platelets 311.0  No results found for requested labs within last 8760 hours.   CrCl cannot be calculated (Patient's most recent lab result is older than the maximum 21 days allowed.).   Wt Readings from Last 3 Encounters:  01/10/18 155 lb (70.3 kg)  07/18/17 140 lb (63.5 kg)  06/23/17 150 lb (68 kg)     Other studies reviewed: Additional studies/records reviewed today include: summarized  above  ASSESSMENT AND PLAN:  1. HTN     A little high today, she says is unusual  Discussed cardiovascular health, importance of BP control, diet, exercise.  She would like her lipids checked.  Will have her come back fasting for lipid panel and get a BMET as well given aldactone.       Disposition:annual visits, and PRN.  Current medicines are reviewed at length with the patient today.  The patient did not have any concerns regarding medicines.  Venetia Night, PA-C 01/10/2018 3:09 PM     [address] [place] [address] (  [phone] (office)  [phone] (fax)

## 2018-01-12 ENCOUNTER — Telehealth: Payer: Self-pay | Admitting: *Deleted

## 2018-01-12 NOTE — Telephone Encounter (Signed)
-----   Message from MACKENSIE PILSON sent at 01/10/2018  2:43 PM EST ----- Regarding: sleep study Tammy Randolph, this lady said she was supposed to have had a sleep study ordered by Dr Lovena Le some time ago, but she has had several medical issues and did not get it scheduled.  She would like to schedule it.  Can you please call her and arrange this?

## 2018-01-14 DIAGNOSIS — J069 Acute upper respiratory infection, unspecified: Secondary | ICD-10-CM | POA: Diagnosis not present

## 2018-01-14 DIAGNOSIS — R05 Cough: Secondary | ICD-10-CM | POA: Diagnosis not present

## 2018-01-16 DIAGNOSIS — M25511 Pain in right shoulder: Secondary | ICD-10-CM | POA: Diagnosis not present

## 2018-01-17 ENCOUNTER — Other Ambulatory Visit: Payer: BLUE CROSS/BLUE SHIELD | Admitting: *Deleted

## 2018-01-17 DIAGNOSIS — Z79899 Other long term (current) drug therapy: Secondary | ICD-10-CM

## 2018-01-17 LAB — BASIC METABOLIC PANEL
BUN/Creatinine Ratio: 14 (ref 9–23)
BUN: 11 mg/dL (ref 6–24)
CALCIUM: 9.6 mg/dL (ref 8.7–10.2)
CO2: 25 mmol/L (ref 20–29)
Chloride: 97 mmol/L (ref 96–106)
Creatinine, Ser: 0.78 mg/dL (ref 0.57–1.00)
GFR calc Af Amer: 100 mL/min/{1.73_m2} (ref >59)
GFR, EST NON AFRICAN AMERICAN: 86 mL/min/{1.73_m2} (ref >59)
Glucose: 103 mg/dL — ABNORMAL HIGH (ref 65–99)
POTASSIUM: 4.5 mmol/L (ref 3.5–5.2)
Sodium: 138 mmol/L (ref 134–144)

## 2018-01-17 LAB — LIPID PANEL
Chol/HDL Ratio: 3.3 ratio (ref 0.0–4.4)
Cholesterol, Total: 206 mg/dL — ABNORMAL HIGH (ref 100–199)
HDL: 62 mg/dL (ref >39)
LDL CALC: 119 mg/dL — AB (ref 0–99)
TRIGLYCERIDES: 123 mg/dL (ref 0–149)
VLDL CHOLESTEROL CAL: 25 mg/dL (ref 5–40)

## 2018-01-22 DIAGNOSIS — M25511 Pain in right shoulder: Secondary | ICD-10-CM | POA: Diagnosis not present

## 2018-01-25 DIAGNOSIS — F3181 Bipolar II disorder: Secondary | ICD-10-CM | POA: Diagnosis not present

## 2018-01-30 ENCOUNTER — Encounter: Payer: Self-pay | Admitting: *Deleted

## 2018-01-30 ENCOUNTER — Telehealth: Payer: Self-pay | Admitting: *Deleted

## 2018-01-30 DIAGNOSIS — G4733 Obstructive sleep apnea (adult) (pediatric): Secondary | ICD-10-CM

## 2018-01-30 NOTE — Telephone Encounter (Signed)
-----   Message from Almyra Free sent at 01/29/2018  2:36 PM EST ----- Regarding: RE: pre cert Pt can be scheduled. Let me know when and I will enter precert info. Thanks, Amy ----- Message ----- From: Freada Bergeron, CMA Sent: 01/12/2018  12:14 PM To: [facility] Subject: pre cert                                       Thanks ----- Message ----- From: Earnestine Mealing Sent: 01/10/2018   2:43 PM To: Freada Bergeron, CMA Subject: sleep study                                    Stark Bray, this lady said she was supposed to have had a sleep study ordered by Dr Lovena Le some time ago, but she has had several medical issues and did not get it scheduled.  She would like to schedule it.  Can you please call her and arrange this?

## 2018-01-30 NOTE — Telephone Encounter (Signed)
RE: pre cert  Lula Olszewski, CMA        Pt can be scheduled. Let me know when and I will enter precert info. Thanks, Amy

## 2018-02-20 ENCOUNTER — Encounter (HOSPITAL_BASED_OUTPATIENT_CLINIC_OR_DEPARTMENT_OTHER): Payer: BLUE CROSS/BLUE SHIELD

## 2018-02-28 DIAGNOSIS — M25511 Pain in right shoulder: Secondary | ICD-10-CM | POA: Diagnosis not present

## 2018-03-05 DIAGNOSIS — M25511 Pain in right shoulder: Secondary | ICD-10-CM | POA: Diagnosis not present

## 2018-03-06 DIAGNOSIS — N760 Acute vaginitis: Secondary | ICD-10-CM | POA: Diagnosis not present

## 2018-03-12 DIAGNOSIS — M25511 Pain in right shoulder: Secondary | ICD-10-CM | POA: Diagnosis not present

## 2018-03-23 NOTE — Telephone Encounter (Signed)
Per Sleep Lab: patient called and cancelled her sleep study 02/15/18.

## 2018-03-23 NOTE — Telephone Encounter (Signed)
This encounter was created in error - please disregard.

## 2018-03-27 DIAGNOSIS — Z01419 Encounter for gynecological examination (general) (routine) without abnormal findings: Secondary | ICD-10-CM | POA: Diagnosis not present

## 2018-03-27 DIAGNOSIS — Z6826 Body mass index (BMI) 26.0-26.9, adult: Secondary | ICD-10-CM | POA: Diagnosis not present

## 2018-03-28 ENCOUNTER — Other Ambulatory Visit: Payer: Self-pay | Admitting: Obstetrics and Gynecology

## 2018-03-28 DIAGNOSIS — E041 Nontoxic single thyroid nodule: Secondary | ICD-10-CM

## 2018-03-29 ENCOUNTER — Other Ambulatory Visit: Payer: Self-pay | Admitting: Internal Medicine

## 2018-03-29 DIAGNOSIS — I10 Essential (primary) hypertension: Secondary | ICD-10-CM

## 2018-04-03 ENCOUNTER — Other Ambulatory Visit: Payer: BLUE CROSS/BLUE SHIELD

## 2018-04-30 DIAGNOSIS — Z0000 Encounter for general adult medical examination without abnormal findings: Secondary | ICD-10-CM | POA: Diagnosis not present

## 2018-04-30 DIAGNOSIS — Z1231 Encounter for screening mammogram for malignant neoplasm of breast: Secondary | ICD-10-CM | POA: Diagnosis not present

## 2018-05-07 ENCOUNTER — Other Ambulatory Visit: Payer: BLUE CROSS/BLUE SHIELD

## 2018-06-18 DIAGNOSIS — N390 Urinary tract infection, site not specified: Secondary | ICD-10-CM | POA: Diagnosis not present

## 2018-06-18 DIAGNOSIS — N941 Unspecified dyspareunia: Secondary | ICD-10-CM | POA: Diagnosis not present

## 2018-06-18 DIAGNOSIS — R159 Full incontinence of feces: Secondary | ICD-10-CM | POA: Diagnosis not present

## 2018-08-01 DIAGNOSIS — F3181 Bipolar II disorder: Secondary | ICD-10-CM | POA: Diagnosis not present

## 2018-08-06 DIAGNOSIS — I10 Essential (primary) hypertension: Secondary | ICD-10-CM | POA: Diagnosis not present

## 2018-08-06 DIAGNOSIS — Z87891 Personal history of nicotine dependence: Secondary | ICD-10-CM | POA: Diagnosis not present

## 2018-08-06 DIAGNOSIS — R159 Full incontinence of feces: Secondary | ICD-10-CM | POA: Diagnosis not present

## 2018-08-06 DIAGNOSIS — F419 Anxiety disorder, unspecified: Secondary | ICD-10-CM | POA: Diagnosis not present

## 2018-08-06 DIAGNOSIS — J45909 Unspecified asthma, uncomplicated: Secondary | ICD-10-CM | POA: Diagnosis not present

## 2018-09-10 DIAGNOSIS — S83241A Other tear of medial meniscus, current injury, right knee, initial encounter: Secondary | ICD-10-CM | POA: Diagnosis not present

## 2018-09-24 DIAGNOSIS — M25561 Pain in right knee: Secondary | ICD-10-CM | POA: Diagnosis not present

## 2018-09-24 DIAGNOSIS — M79651 Pain in right thigh: Secondary | ICD-10-CM | POA: Diagnosis not present

## 2018-10-02 DIAGNOSIS — H52223 Regular astigmatism, bilateral: Secondary | ICD-10-CM | POA: Diagnosis not present

## 2018-10-02 DIAGNOSIS — H524 Presbyopia: Secondary | ICD-10-CM | POA: Diagnosis not present

## 2018-10-02 DIAGNOSIS — H5203 Hypermetropia, bilateral: Secondary | ICD-10-CM | POA: Diagnosis not present

## 2018-10-04 DIAGNOSIS — J101 Influenza due to other identified influenza virus with other respiratory manifestations: Secondary | ICD-10-CM | POA: Diagnosis not present

## 2018-11-03 DIAGNOSIS — J209 Acute bronchitis, unspecified: Secondary | ICD-10-CM | POA: Diagnosis not present

## 2018-11-03 DIAGNOSIS — R062 Wheezing: Secondary | ICD-10-CM | POA: Diagnosis not present

## 2018-11-03 DIAGNOSIS — R05 Cough: Secondary | ICD-10-CM | POA: Diagnosis not present

## 2018-12-13 ENCOUNTER — Other Ambulatory Visit: Payer: Self-pay | Admitting: Internal Medicine

## 2019-01-08 DIAGNOSIS — J029 Acute pharyngitis, unspecified: Secondary | ICD-10-CM | POA: Diagnosis not present

## 2019-01-08 DIAGNOSIS — E860 Dehydration: Secondary | ICD-10-CM | POA: Diagnosis not present

## 2019-01-08 DIAGNOSIS — J209 Acute bronchitis, unspecified: Secondary | ICD-10-CM | POA: Diagnosis not present

## 2019-01-08 DIAGNOSIS — J019 Acute sinusitis, unspecified: Secondary | ICD-10-CM | POA: Diagnosis not present

## 2019-02-18 ENCOUNTER — Encounter: Payer: Self-pay | Admitting: Psychiatry

## 2019-02-18 ENCOUNTER — Ambulatory Visit: Payer: BLUE CROSS/BLUE SHIELD | Admitting: Psychiatry

## 2019-02-18 VITALS — BP 128/82 | HR 76 | Ht 64.0 in | Wt 154.0 lb

## 2019-02-18 DIAGNOSIS — F3181 Bipolar II disorder: Secondary | ICD-10-CM | POA: Diagnosis not present

## 2019-02-18 DIAGNOSIS — F900 Attention-deficit hyperactivity disorder, predominantly inattentive type: Secondary | ICD-10-CM

## 2019-02-18 DIAGNOSIS — F411 Generalized anxiety disorder: Secondary | ICD-10-CM | POA: Insufficient documentation

## 2019-02-18 MED ORDER — AMPHETAMINE-DEXTROAMPHET ER 20 MG PO CP24: 20.0000 mg | ORAL_CAPSULE | Freq: Every day | ORAL | 0 refills | Status: DC

## 2019-02-18 MED ORDER — ZOLPIDEM TARTRATE 10 MG PO TABS: ORAL_TABLET | ORAL | 2 refills | Status: DC

## 2019-02-18 MED ORDER — BUPROPION HCL ER (XL) 150 MG PO TB24: 450.0000 mg | ORAL_TABLET | Freq: Every day | ORAL | 5 refills | Status: DC

## 2019-02-18 MED ORDER — ALPRAZOLAM 1 MG PO TABS: 1.0000 mg | ORAL_TABLET | Freq: Four times a day (QID) | ORAL | 2 refills | Status: DC | PRN

## 2019-02-18 NOTE — Progress Notes (Signed)
[name]  Patient ID: Tammy Randolph,  MRN: [id number]  PCP: Ann Held, DO  Date of Evaluation: 02/18/2019 Time spent:20 minutes  Chief Complaint:  Chief Complaint    ADHD; Anxiety; Depression      HISTORY/CURRENT STATUS: Tammy Randolph is seen individually face-to-face with consent not collateral for psychiatric interview and exam in 83-month evaluation and management of bipolar II diagnosis potentially over determined from Dr. Candis Schatz predominantly atypically depressed, generalized anxiety, and ADHD in the last couple years.  At last appointment 6 months ago, patient had discontinued her Lamictal and Viibryd having no mania or severe mood swings in the interim.  Wellbutrin alone has been reasonably stabilizing of depressive symptoms as well as helpful for ADHD and anxiety. Adderall is her most consistent medication of benefit for ADHD also helping mood. Her chief complaint today is low energy and fatigue she relates mostly to the somewhat hopeless status of relationship with husband due to his unfaithfulness and disinterest, while patient has anxious distress that is exhausting.  Husband does have individual therapy still with last session including patient mobilizing intense confrontation from therapist of husband's infidelity in session 3 weeks ago.  They have the option of a week in [place] at family preservation program which patient will likely attend even if husband will not go.  Depression       The patient presents with depression.  This is a recurrent problem.  The current episode started more than 1 year ago.   The onset quality is gradual.   The problem occurs intermittently.  The problem has been waxing and waning since onset.  Associated symptoms include decreased concentration, fatigue, hopelessness, insomnia, decreased interest and sad.  Associated symptoms include not irritable, no appetite change, no myalgias, no headaches, no indigestion and no suicidal  ideas.     The symptoms are aggravated by family issues and social issues.  Past treatments include psychotherapy, other medications and SSRIs - Selective serotonin reuptake inhibitors.  Compliance with treatment is variable.  Past compliance problems include medical issues, medication issues and difficulty with treatment plan.  Risk factors include a change in medication usage/dosage, emotional abuse, family history, history of mental illness, major life event, marital problems and stress.   Past medical history includes anxiety, bipolar disorder, depression and mental health disorder.     Pertinent negatives include no life-threatening condition, no physical disability, no recent psychiatric admission, no eating disorder, no obsessive-compulsive disorder, no post-traumatic stress disorder, no schizophrenia, no suicide attempts and no head trauma.   Individual Medical History/ Review of Systems: Changes? :Yes [place] registry of controlled substances documents 08/01/2018 prescription for Adderall filled 8/28, 10/8, and 11/21/2018.  08/01/2018 prescription for Ambien was filled 08/21/2018 and 10/02/2018.  Last fill of Xanax was for call in 08/22/2018 with prescription filled that day but not since. 12/2017 glucose 103 slightly elevated if fasting with total cholesterol slightly elevated 206, LDL 119 with upper limit of normal 99, and HDL normal at 62 and triglyceride 123 mg/dl.  EKG 12/2017 was normal with rate 84 bpm, PR 158, QRS 72 and QTc 415 ms.  Allergies: Hydrocodone-acetaminophen and Morphine  Current Medications:  Current Outpatient Medications:  .  albuterol (PROVENTIL HFA;VENTOLIN HFA) 108 (90 BASE) MCG/ACT inhaler, Inhale 1-2 puffs into the lungs every 6 (six) hours as needed for wheezing or shortness of breath., Disp: , Rfl:  .  ALPRAZolam (XANAX) 1 MG tablet, Take 1 tablet (1 mg total) by mouth 4 (four) times daily as  needed for anxiety., Disp: 120 tablet, Rfl: 2 .  amphetamine-dextroamphetamine (ADDERALL  XR) 20 MG 24 hr capsule, Take 1 capsule (20 mg total) by mouth daily after breakfast for 30 days., Disp: 30 capsule, Rfl: 0 .  [START ON 03/20/2019] amphetamine-dextroamphetamine (ADDERALL XR) 20 MG 24 hr capsule, Take 1 capsule (20 mg total) by mouth daily after breakfast for 30 days., Disp: 30 capsule, Rfl: 0 .  [START ON 04/19/2019] amphetamine-dextroamphetamine (ADDERALL XR) 20 MG 24 hr capsule, Take 1 capsule (20 mg total) by mouth daily after breakfast for 30 days., Disp: 30 capsule, Rfl: 0 .  buPROPion (WELLBUTRIN XL) 150 MG 24 hr tablet, Take 3 tablets (450 mg total) by mouth daily after breakfast., Disp: 90 tablet, Rfl: 5 .  carvedilol (COREG) 12.5 MG tablet, TAKE 1.5 TABLETS BY MOUTH TWICE DAILY WITH A MEAL, Disp: 270 tablet, Rfl: 2 .  methocarbamol (ROBAXIN) 500 MG tablet, Take 1 tablet (500 mg total) by mouth 3 (three) times daily as needed for muscle spasms., Disp: 60 tablet, Rfl: 0 .  ondansetron (ZOFRAN) 4 MG tablet, Take 1 tablet (4 mg total) by mouth every 8 (eight) hours as needed for nausea or vomiting., Disp: 20 tablet, Rfl: 0 .  oxyCODONE-acetaminophen (PERCOCET) 10-325 MG tablet, Take 1 tablet by mouth every 4 (four) hours as needed for pain., Disp: 60 tablet, Rfl: 0 .  pseudoephedrine-acetaminophen (TYLENOL SINUS) 30-500 MG TABS, Take 1 tablet by mouth every 4 (four) hours as needed (Seasonal Allergies)., Disp: , Rfl:  .  spironolactone (ALDACTONE) 25 MG tablet, Take 1 tablet (25 mg total) by mouth daily. Please make yearly appt with Dr. Lovena Le for January before anymore refills. 1st attempt, Disp: 90 tablet, Rfl: 0 .  SUMAtriptan (IMITREX) 50 MG tablet, as directed. TAKE 1 TABLET BY MOUTH AS NEEDED, Disp: , Rfl:  .  zolpidem (AMBIEN) 10 MG tablet, Take 1 pill total 10 mg by mouth every bedtime, Disp: 30 tablet, Rfl: 2   Medication Side Effects: none  Family Medical/ Social History: Changes? Yes daughter marrying last September, and son and nephew graduating but she does not  acknowledge they went to college.  She feels most at home in [place] on her travels.  MENTAL HEALTH EXAM: Muscle strengths and tone 5/5, postural reflexes and gait 0/0, and AIMS = 0. Blood pressure 128/82, pulse 76, height 5\' 4"  (1.626 m), weight 154 lb (69.9 kg).Body mass index is 26.43 kg/m.  General Appearance: Casual and Well Groomed  Eye Contact:  Good  Speech:  Clear and Coherent and Normal Rate  Volume:  Normal  Mood:  Anxious, Dysphoric, Euthymic, Hopeless and Irritable  Affect:  Constricted, Depressed, Labile, Full Range and Anxious  Thought Process:  Goal Directed and Linear  Orientation:  Full (Time, Place, and Person)  Thought Content: Obsessions and Rumination   Suicidal Thoughts:  No  Homicidal Thoughts:  No  Memory:  Immediate;   Good Remote;   Good  Judgement:  Good  Insight:  Good  Psychomotor Activity:  Increased, Decreased and Mannerisms  Concentration:  Concentration: Fair and Attention Span: Fair  Recall:  Good  Fund of Knowledge: Good  Language: Good  Assets:  Desire for Improvement Resilience Social Support Talents/Skills  ADL's:  Intact  Cognition: WNL  Prognosis:  Good    DIAGNOSES:    ICD-10-CM   1. Generalized anxiety disorder F41.1 buPROPion (WELLBUTRIN XL) 150 MG 24 hr tablet    ALPRAZolam (XANAX) 1 MG tablet    zolpidem (AMBIEN) 10  MG tablet  2. Bipolar II disorder, mild, depressed, with anxious distress, in partial remission (HCC) F31.81 buPROPion (WELLBUTRIN XL) 150 MG 24 hr tablet    ALPRAZolam (XANAX) 1 MG tablet    zolpidem (AMBIEN) 10 MG tablet  3. 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    RECOMMENDATIONS: She is E scribed Adderall 20 mg XR every morning as a month supply each  for February, March, and April for ADHD sent to [facility] in [place] on [address].  She is E scribed Ambien 10 mg nightly #30 with 2 refills sent to [facility] for insomnia associated with mood and anxiety disorders.  She is E scribed Xanax 1 mg 4 times daily as needed for anxiety including panic #120 with 2 refills sent to [facility] for GAD.  She is E scribed Wellbutrin 150 mg XL tablet taking 3 tablets total 450 mg XL every morning as #90 with 5 refills sent to [facility], to return in 6 months or sooner if needed.   Delight Hoh, MD

## 2019-03-07 DIAGNOSIS — R509 Fever, unspecified: Secondary | ICD-10-CM | POA: Diagnosis not present

## 2019-03-07 DIAGNOSIS — J111 Influenza due to unidentified influenza virus with other respiratory manifestations: Secondary | ICD-10-CM | POA: Diagnosis not present

## 2019-03-07 DIAGNOSIS — R52 Pain, unspecified: Secondary | ICD-10-CM | POA: Diagnosis not present

## 2019-03-07 DIAGNOSIS — R05 Cough: Secondary | ICD-10-CM | POA: Diagnosis not present

## 2019-03-11 DIAGNOSIS — R5081 Fever presenting with conditions classified elsewhere: Secondary | ICD-10-CM | POA: Diagnosis not present

## 2019-03-13 ENCOUNTER — Other Ambulatory Visit: Payer: Self-pay | Admitting: Internal Medicine

## 2019-03-13 DIAGNOSIS — I10 Essential (primary) hypertension: Secondary | ICD-10-CM

## 2019-03-23 DIAGNOSIS — J0100 Acute maxillary sinusitis, unspecified: Secondary | ICD-10-CM | POA: Diagnosis not present

## 2019-04-09 DIAGNOSIS — M545 Low back pain: Secondary | ICD-10-CM | POA: Diagnosis not present

## 2019-04-09 DIAGNOSIS — Z9889 Other specified postprocedural states: Secondary | ICD-10-CM | POA: Diagnosis not present

## 2019-04-09 DIAGNOSIS — M5136 Other intervertebral disc degeneration, lumbar region: Secondary | ICD-10-CM | POA: Diagnosis not present

## 2019-04-15 DIAGNOSIS — M545 Low back pain: Secondary | ICD-10-CM | POA: Diagnosis not present

## 2019-04-17 DIAGNOSIS — M545 Low back pain: Secondary | ICD-10-CM | POA: Diagnosis not present

## 2019-04-17 DIAGNOSIS — M5136 Other intervertebral disc degeneration, lumbar region: Secondary | ICD-10-CM | POA: Diagnosis not present

## 2019-04-25 DIAGNOSIS — M5416 Radiculopathy, lumbar region: Secondary | ICD-10-CM | POA: Diagnosis not present

## 2019-05-09 DIAGNOSIS — M5116 Intervertebral disc disorders with radiculopathy, lumbar region: Secondary | ICD-10-CM | POA: Diagnosis not present

## 2019-06-02 DIAGNOSIS — N390 Urinary tract infection, site not specified: Secondary | ICD-10-CM | POA: Diagnosis not present

## 2019-06-11 ENCOUNTER — Other Ambulatory Visit: Payer: Self-pay | Admitting: Internal Medicine

## 2019-06-13 DIAGNOSIS — L237 Allergic contact dermatitis due to plants, except food: Secondary | ICD-10-CM | POA: Diagnosis not present

## 2019-06-29 DIAGNOSIS — N390 Urinary tract infection, site not specified: Secondary | ICD-10-CM | POA: Diagnosis not present

## 2019-07-11 ENCOUNTER — Other Ambulatory Visit: Payer: Self-pay | Admitting: Internal Medicine

## 2019-07-17 DIAGNOSIS — J209 Acute bronchitis, unspecified: Secondary | ICD-10-CM | POA: Diagnosis not present

## 2019-07-17 DIAGNOSIS — Z20828 Contact with and (suspected) exposure to other viral communicable diseases: Secondary | ICD-10-CM | POA: Diagnosis not present

## 2019-07-19 DIAGNOSIS — Z20828 Contact with and (suspected) exposure to other viral communicable diseases: Secondary | ICD-10-CM | POA: Diagnosis not present

## 2019-07-19 DIAGNOSIS — Z1159 Encounter for screening for other viral diseases: Secondary | ICD-10-CM | POA: Diagnosis not present

## 2019-07-24 ENCOUNTER — Other Ambulatory Visit: Payer: Self-pay | Admitting: Psychiatry

## 2019-07-24 DIAGNOSIS — F411 Generalized anxiety disorder: Secondary | ICD-10-CM

## 2019-07-24 DIAGNOSIS — F3181 Bipolar II disorder: Secondary | ICD-10-CM

## 2019-07-25 NOTE — Telephone Encounter (Signed)
Due back in August. Refills?

## 2019-07-26 DIAGNOSIS — B354 Tinea corporis: Secondary | ICD-10-CM | POA: Diagnosis not present

## 2019-08-12 DIAGNOSIS — Z1159 Encounter for screening for other viral diseases: Secondary | ICD-10-CM | POA: Diagnosis not present

## 2019-08-12 DIAGNOSIS — J019 Acute sinusitis, unspecified: Secondary | ICD-10-CM | POA: Diagnosis not present

## 2019-08-12 DIAGNOSIS — Z20828 Contact with and (suspected) exposure to other viral communicable diseases: Secondary | ICD-10-CM | POA: Diagnosis not present

## 2019-08-14 ENCOUNTER — Telehealth: Payer: Self-pay | Admitting: Psychiatry

## 2019-08-14 ENCOUNTER — Other Ambulatory Visit: Payer: Self-pay

## 2019-08-14 MED ORDER — AMPHETAMINE-DEXTROAMPHET ER 20 MG PO CP24: 20.0000 mg | ORAL_CAPSULE | Freq: Every day | ORAL | 0 refills | Status: DC

## 2019-08-14 NOTE — Telephone Encounter (Signed)
For last appointment 02/18/2019 just filling the third of the 3 fills from that appointment on 05/15/2019, patient now needs interim fill to next appointment 08/19/2019 sent to [facility] on [address]. for Adderall 20 mg XR every morning #30 with no refill medically necessary no contraindication.

## 2019-08-14 NOTE — Telephone Encounter (Signed)
Tammy Randolph called to request refill of her Adderall XR.  Appt 08/19/19.  Send to [facility] on [address]

## 2019-08-14 NOTE — Telephone Encounter (Signed)
Pended for approval, last fill 05/15/2019

## 2019-08-19 ENCOUNTER — Encounter: Payer: Self-pay | Admitting: Psychiatry

## 2019-08-19 ENCOUNTER — Ambulatory Visit (INDEPENDENT_AMBULATORY_CARE_PROVIDER_SITE_OTHER): Payer: BC Managed Care – PPO | Admitting: Psychiatry

## 2019-08-19 ENCOUNTER — Other Ambulatory Visit: Payer: Self-pay

## 2019-08-19 VITALS — Ht 62.0 in | Wt 160.0 lb

## 2019-08-19 DIAGNOSIS — F411 Generalized anxiety disorder: Secondary | ICD-10-CM

## 2019-08-19 DIAGNOSIS — F3181 Bipolar II disorder: Secondary | ICD-10-CM | POA: Diagnosis not present

## 2019-08-19 DIAGNOSIS — F900 Attention-deficit hyperactivity disorder, predominantly inattentive type: Secondary | ICD-10-CM

## 2019-08-19 MED ORDER — AMPHETAMINE-DEXTROAMPHET ER 20 MG PO CP24: 20.0000 mg | ORAL_CAPSULE | Freq: Every day | ORAL | 0 refills | Status: DC

## 2019-08-19 MED ORDER — DULOXETINE HCL 30 MG PO CPEP: 30.0000 mg | ORAL_CAPSULE | Freq: Every day | ORAL | 5 refills | Status: DC

## 2019-08-19 MED ORDER — ALPRAZOLAM 1 MG PO TABS: ORAL_TABLET | ORAL | 2 refills | Status: DC

## 2019-08-19 MED ORDER — ZOLPIDEM TARTRATE 10 MG PO TABS: ORAL_TABLET | ORAL | 4 refills | Status: DC

## 2019-08-19 NOTE — Progress Notes (Signed)
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  She is therefore been unable to travel that it provided some sensation for mood and anxiety symptoms last year.  As at last visit, she complains of low energy and fatigue now also adding low interest.  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  Though she talks with interest felt such, she maintains that Wellbutrin is not helping in she is stopped Lamictal and Viibryd in her more recent care.  Antidepressant treatment with Celexa, Lexapro, and Effexor in addition to Viibryd and Wellbutrin.  Has difficulty initiating sleep even with the Ambien may be from depression and boredom that she is not achieving good day-to-day work ability.  She had recent general medical exam that may have included laboratory assessments not present in epic.  She request additional help with depression medication discussing options with no hypomania off Lamictal in years diagnosis of bipolar 2 from Dr. Dayle Points 10 years of care starting in 2007.  Currently she is clinically appropriate change Wellbutrin to an alternative antidepressant such as SNRI.  She has not found better somnorific than Ambien though samples can be provided today to consider alternative.  She has no mania, suicidality, psychosis or  delirium.   Depression        The patient presents with depression.  This is a recurrent problem.  The current episode started more than 1 year ago.   The onset quality is gradual.   The problem occurs intermittently.  The problem has been waxing and waning since onset.  Associated symptoms include decreased concentration, fatigue, hopelessness, insomnia, decreased interest and sad.  Associated symptoms include not irritable, no appetite change, no myalgias, no headaches, no indigestion and no suicidal ideas.     The symptoms are aggravated by family issues and social issues.  Past treatments include psychotherapy, other medications and SSRIs - Selective serotonin reuptake inhibitors.  Compliance with treatment is variable.  Past compliance problems include medical issues, medication issues and difficulty with treatment plan.  Risk factors include a change in medication usage/dosage, emotional abuse, family history, history of mental illness, major life event, marital problems and stress.   Past medical history includes anxiety, bipolar disorder, depression and mental health disorder.     Pertinent negatives include no life-threatening condition, no physical disability, no recent psychiatric admission, no eating disorder, no obsessive-compulsive disorder, no post-traumatic stress disorder, no schizophrenia, no suicide attempts and no head trauma.  Individual Medical History/ Review of Systems: Changes? :Yes   She had recent general medical exam 08/14/2019 [place] that may have included laboratory assessments not present in epic. May 2019 testing revealed glucose 102 slightly elevated with fasting with total cholesterol 181, LDL 103 with upper limit of normal 99, and HDL normal at 58 and triglyceride 102 mg/dl and TSH 1.16.  EKG 12/2017 was normal with rate 84 bpm, PR 158,  QRS 72 and QTc 415 ms. [facility] registry documents last Adderall fill 08/14/2019, Ambien 07/24/2019 and 05/15/2019, and Xanax  05/15/2019.  Allergies: Hydrocodone-acetaminophen and Morphine  Current Medications:  Current Outpatient Medications:  .  albuterol (PROVENTIL HFA;VENTOLIN HFA) 108 (90 BASE) MCG/ACT inhaler, Inhale 1-2 puffs into the lungs every 6 (six) hours as needed for wheezing or shortness of breath., Disp: , Rfl:  .  ALPRAZolam (XANAX) 1 MG tablet, TAKE 1 TABLET(1 MG) BY MOUTH FOUR TIMES DAILY AS NEEDED FOR ANXIETY, Disp: 120 tablet, Rfl: 2 .  amphetamine-dextroamphetamine (ADDERALL XR) 20 MG 24 hr capsule, Take 1 capsule (20 mg total) by mouth daily after breakfast., Disp: 30 capsule, Rfl: 0 .  [START ON 09/13/2019] amphetamine-dextroamphetamine (ADDERALL XR) 20 MG 24 hr capsule, Take 1 capsule (20 mg total) by mouth daily after breakfast., Disp: 30 capsule, Rfl: 0 .  [START ON 10/13/2019] amphetamine-dextroamphetamine (ADDERALL XR) 20 MG 24 hr capsule, Take 1 capsule (20 mg total) by mouth daily after breakfast., Disp: 30 capsule, Rfl: 0 .  carvedilol (COREG) 12.5 MG tablet, TAKE 1.5 TABLETS BY MOUTH TWICE DAILY WITH A MEAL, Disp: 270 tablet, Rfl: 2 .  DULoxetine (CYMBALTA) 30 MG capsule, Take 1 capsule (30 mg total) by mouth daily after breakfast., Disp: 30 capsule, Rfl: 5 .  methocarbamol (ROBAXIN) 500 MG tablet, Take 1 tablet (500 mg total) by mouth 3 (three) times daily as needed for muscle spasms., Disp: 60 tablet, Rfl: 0 .  ondansetron (ZOFRAN) 4 MG tablet, Take 1 tablet (4 mg total) by mouth every 8 (eight) hours as needed for nausea or vomiting., Disp: 20 tablet, Rfl: 0 .  oxyCODONE-acetaminophen (PERCOCET) 10-325 MG tablet, Take 1 tablet by mouth every 4 (four) hours as needed for pain., Disp: 60 tablet, Rfl: 0 .  pseudoephedrine-acetaminophen (TYLENOL SINUS) 30-500 MG TABS, Take 1 tablet by mouth every 4 (four) hours as needed (Seasonal Allergies)., Disp: , Rfl:  .  spironolactone (ALDACTONE) 25 MG tablet, Take 1 tablet (25 mg total) by mouth daily. Please make annual appt with Dr. Lovena Le for future  refills. Thank you. 2nd attempt., Disp: 15 tablet, Rfl: 0 .  SUMAtriptan (IMITREX) 50 MG tablet, as directed. TAKE 1 TABLET BY MOUTH AS NEEDED, Disp: , Rfl:  .  zolpidem (AMBIEN) 10 MG tablet, Take 1 pill total 10 mg by mouth every bedtime, Disp: 30 tablet, Rfl: 4   Medication Side Effects: none  Family Medical/ Social History: Changes? No patient offering no clarification or update regarding marital distress.  MENTAL HEALTH EXAM:  Height 5\' 2"  (1.575 m), weight 160 lb (72.6 kg).Body mass index is 29.26 kg/m.  Others deferred for coronavirus pandemic having general medical exam 08/14/2019  General Appearance: Casual, Meticulous and Well Groomed  Eye Contact:  Good  Speech:  Clear and Coherent, Normal Rate and Talkative  Volume:  Normal  Mood:  Anxious, Depressed, Dysphoric, Hopeless and Worthless  Affect:  Congruent, Depressed, Inappropriate, Full Range and Anxious  Thought Process:  Coherent, Irrelevant and Linear  Orientation:  Full (Time, Place, and Person)  Thought Content: Ilusions, Obsessions and Rumination   Suicidal Thoughts:  No  Homicidal Thoughts:  No  Memory:  Immediate;   Good Remote;   Good  Judgement:  Fair  Insight:  Fair  Psychomotor Activity:  Normal and Decreased  Concentration:  Concentration: Fair and Attention Span: Fair  Recall:  Good  Fund of Knowledge: Good  Language: Good  Assets:  Desire for Improvement Leisure Time Resilience Talents/Skills  ADL's:  Intact  Cognition: WNL  Prognosis:  Good    DIAGNOSES:    ICD-10-CM   1. Generalized anxiety disorder  F41.1 zolpidem (AMBIEN) 10 MG tablet    ALPRAZolam (XANAX) 1 MG tablet    DULoxetine (CYMBALTA) 30 MG capsule  2. Bipolar II disorder, mild, depressed, with anxious distress, in partial remission (HCC)  F31.81 zolpidem (AMBIEN) 10 MG tablet    ALPRAZolam (XANAX) 1 MG tablet    DULoxetine (CYMBALTA) 30 MG capsule  3. Attention deficit hyperactivity disorder (ADHD), inattentive type, moderate   F90.0 amphetamine-dextroamphetamine (ADDERALL XR) 20 MG 24 hr capsule    amphetamine-dextroamphetamine (ADDERALL XR) 20 MG 24 hr capsule    Receiving Psychotherapy: No    RECOMMENDATIONS: After having stopped Viibryd and Lamictal last year, the patient will now start Cymbalta 30 mg every morning sent as #30 with 5 refills to [facility]. for bipolar depression, generalized anxiety, and ADHD as Wellbutrin is discontinued processing options of abrupt discontinuation to assess any past efficacy unrecognized versus tapering over 10 days to maximally prevent any rebound depression hopefully that will be covered better by Cymbalta.  She is E scribed Xanax 1 mg 4 times daily as needed anxiety #120 with 2 refills sent to [facility] for generalized anxiety.  She continues Ambien 10 mg every bedtime sent as #30 with 4 refills to [facility] for generalized anxiety and bipolar insomnia, though provided samples #3 of Belsomra 15 mg nightly as needed for insomnia for which she can contact the office for replacing the Ambien if more helpful.  She is E scribed Adderall 20 mg XR every morning sent as #30 each for September 18 and October 18 for ADHD.  Reeducation is provided on medications for mention and monitoring, safety hygiene, and exam risk crisis plans if needed.  She returns in 6 months for follow up or sooner if willing.   Delight Hoh, MD

## 2019-08-25 MED ORDER — BELSOMRA 15 MG PO TABS: 15.0000 mg | ORAL_TABLET | Freq: Every evening | ORAL | 0 refills | Status: DC | PRN

## 2019-08-27 DIAGNOSIS — Z6826 Body mass index (BMI) 26.0-26.9, adult: Secondary | ICD-10-CM | POA: Diagnosis not present

## 2019-08-27 DIAGNOSIS — Z01419 Encounter for gynecological examination (general) (routine) without abnormal findings: Secondary | ICD-10-CM | POA: Diagnosis not present

## 2019-08-27 DIAGNOSIS — Z86010 Personal history of colonic polyps: Secondary | ICD-10-CM | POA: Diagnosis not present

## 2019-08-27 DIAGNOSIS — Z803 Family history of malignant neoplasm of breast: Secondary | ICD-10-CM | POA: Diagnosis not present

## 2019-08-27 DIAGNOSIS — Z800 Family history of malignant neoplasm of digestive organs: Secondary | ICD-10-CM | POA: Diagnosis not present

## 2019-08-27 DIAGNOSIS — Z808 Family history of malignant neoplasm of other organs or systems: Secondary | ICD-10-CM | POA: Diagnosis not present

## 2019-09-09 DIAGNOSIS — G43909 Migraine, unspecified, not intractable, without status migrainosus: Secondary | ICD-10-CM | POA: Diagnosis not present

## 2019-09-09 DIAGNOSIS — Z79899 Other long term (current) drug therapy: Secondary | ICD-10-CM | POA: Diagnosis not present

## 2019-09-09 DIAGNOSIS — R5383 Other fatigue: Secondary | ICD-10-CM | POA: Diagnosis not present

## 2019-09-09 DIAGNOSIS — Z23 Encounter for immunization: Secondary | ICD-10-CM | POA: Diagnosis not present

## 2019-09-09 DIAGNOSIS — E039 Hypothyroidism, unspecified: Secondary | ICD-10-CM | POA: Diagnosis not present

## 2019-09-09 DIAGNOSIS — E042 Nontoxic multinodular goiter: Secondary | ICD-10-CM | POA: Diagnosis not present

## 2019-09-09 DIAGNOSIS — Z87891 Personal history of nicotine dependence: Secondary | ICD-10-CM | POA: Diagnosis not present

## 2019-09-10 DIAGNOSIS — Z803 Family history of malignant neoplasm of breast: Secondary | ICD-10-CM | POA: Diagnosis not present

## 2019-09-10 DIAGNOSIS — Z1231 Encounter for screening mammogram for malignant neoplasm of breast: Secondary | ICD-10-CM | POA: Diagnosis not present

## 2019-09-10 LAB — HM MAMMOGRAPHY

## 2019-09-17 DIAGNOSIS — L570 Actinic keratosis: Secondary | ICD-10-CM | POA: Diagnosis not present

## 2019-09-17 DIAGNOSIS — X32XXXA Exposure to sunlight, initial encounter: Secondary | ICD-10-CM | POA: Diagnosis not present

## 2019-09-17 DIAGNOSIS — B078 Other viral warts: Secondary | ICD-10-CM | POA: Diagnosis not present

## 2019-09-27 ENCOUNTER — Other Ambulatory Visit: Payer: Self-pay | Admitting: Psychiatry

## 2019-09-27 DIAGNOSIS — F411 Generalized anxiety disorder: Secondary | ICD-10-CM

## 2019-09-27 DIAGNOSIS — F3181 Bipolar II disorder: Secondary | ICD-10-CM

## 2019-09-27 DIAGNOSIS — F900 Attention-deficit hyperactivity disorder, predominantly inattentive type: Secondary | ICD-10-CM

## 2019-09-30 ENCOUNTER — Telehealth: Payer: Self-pay | Admitting: Psychiatry

## 2019-09-30 DIAGNOSIS — F3181 Bipolar II disorder: Secondary | ICD-10-CM

## 2019-09-30 DIAGNOSIS — F900 Attention-deficit hyperactivity disorder, predominantly inattentive type: Secondary | ICD-10-CM

## 2019-09-30 DIAGNOSIS — F411 Generalized anxiety disorder: Secondary | ICD-10-CM

## 2019-09-30 MED ORDER — BUPROPION HCL ER (XL) 150 MG PO TB24: 450.0000 mg | ORAL_TABLET | Freq: Every day | ORAL | 2 refills | Status: DC

## 2019-09-30 MED ORDER — BELSOMRA 15 MG PO TABS: 15.0000 mg | ORAL_TABLET | Freq: Every evening | ORAL | 2 refills | Status: DC | PRN

## 2019-09-30 NOTE — Telephone Encounter (Signed)
Pt would like to start back on Wellbutrin and Belsomra for . She does not like the new meds at all.

## 2019-09-30 NOTE — Telephone Encounter (Signed)
Patient phones that Cymbalta 30 mg is not acceptable wishing to return to Wellbutrin 150 mg XL as 3 every morning now realizing its benefit as she is off the medication on another sent as #90 with 2 refills to [facility] on [address].  She also request the Belsomra 15 mg every bedtime for 30 with 2 refills in place of Ambien 10 mg sent to [facility] on [address] who can notify patient to pick up.

## 2019-10-03 DIAGNOSIS — N3000 Acute cystitis without hematuria: Secondary | ICD-10-CM | POA: Diagnosis not present

## 2019-10-07 DIAGNOSIS — N3000 Acute cystitis without hematuria: Secondary | ICD-10-CM | POA: Diagnosis not present

## 2019-10-14 ENCOUNTER — Other Ambulatory Visit: Payer: Self-pay | Admitting: Psychiatry

## 2019-10-14 DIAGNOSIS — F900 Attention-deficit hyperactivity disorder, predominantly inattentive type: Secondary | ICD-10-CM

## 2019-10-14 DIAGNOSIS — F411 Generalized anxiety disorder: Secondary | ICD-10-CM

## 2019-10-14 DIAGNOSIS — F3181 Bipolar II disorder: Secondary | ICD-10-CM

## 2019-10-15 ENCOUNTER — Other Ambulatory Visit: Payer: Self-pay

## 2019-10-15 DIAGNOSIS — F3181 Bipolar II disorder: Secondary | ICD-10-CM

## 2019-10-15 DIAGNOSIS — F411 Generalized anxiety disorder: Secondary | ICD-10-CM

## 2019-10-15 NOTE — Telephone Encounter (Signed)
Phone message to patient as no answer clarifying through [facility] registry that she received an eScription for Xanax 120 tablets on 08/19/2019 with 2 refills having refilled once on 09/27/2019 therefore as per epic confirmation and pharmacy receipt they should have 1 refill remaining in the pharmacy.  The same pharmacy she always uses sends for another 120 Xanax with 2 refills as though this might be an auto refill but prematurely especially as she has 12 days until her next refill is due.  I left patient a message for patient to notify us if she is asking for refill or has any special circumstances, but if not then the pharmacy seems confused and will need to clarify with the pharmacy what they are looking for.

## 2019-10-21 DIAGNOSIS — M25512 Pain in left shoulder: Secondary | ICD-10-CM | POA: Diagnosis not present

## 2019-10-21 DIAGNOSIS — G4733 Obstructive sleep apnea (adult) (pediatric): Secondary | ICD-10-CM | POA: Diagnosis not present

## 2019-10-30 DIAGNOSIS — M25512 Pain in left shoulder: Secondary | ICD-10-CM | POA: Diagnosis not present

## 2019-11-20 DIAGNOSIS — M25512 Pain in left shoulder: Secondary | ICD-10-CM | POA: Diagnosis not present

## 2019-11-20 DIAGNOSIS — M7542 Impingement syndrome of left shoulder: Secondary | ICD-10-CM | POA: Diagnosis not present

## 2019-11-26 DIAGNOSIS — J069 Acute upper respiratory infection, unspecified: Secondary | ICD-10-CM | POA: Diagnosis not present

## 2019-12-13 DIAGNOSIS — Z20828 Contact with and (suspected) exposure to other viral communicable diseases: Secondary | ICD-10-CM | POA: Diagnosis not present

## 2019-12-24 DIAGNOSIS — Z20828 Contact with and (suspected) exposure to other viral communicable diseases: Secondary | ICD-10-CM | POA: Diagnosis not present

## 2019-12-25 ENCOUNTER — Other Ambulatory Visit: Payer: Self-pay | Admitting: Psychiatry

## 2019-12-25 ENCOUNTER — Other Ambulatory Visit: Payer: Self-pay | Admitting: Internal Medicine

## 2019-12-25 DIAGNOSIS — F411 Generalized anxiety disorder: Secondary | ICD-10-CM

## 2019-12-25 DIAGNOSIS — I10 Essential (primary) hypertension: Secondary | ICD-10-CM

## 2019-12-25 DIAGNOSIS — F900 Attention-deficit hyperactivity disorder, predominantly inattentive type: Secondary | ICD-10-CM

## 2019-12-25 DIAGNOSIS — F3181 Bipolar II disorder: Secondary | ICD-10-CM

## 2019-12-25 NOTE — Telephone Encounter (Signed)
Last apt 08/19/2019

## 2019-12-25 NOTE — Telephone Encounter (Signed)
Patient required Cymbalta to be changed back to Wellbutrin 10/15/2019 E scribing a month supply and 1 refill then now needing continuation with another month supply and 1 refill of 150 mg XL taking 3 daily E scribed to [facility] for interim to next appointment in February.

## 2019-12-31 DIAGNOSIS — H66002 Acute suppurative otitis media without spontaneous rupture of ear drum, left ear: Secondary | ICD-10-CM | POA: Diagnosis not present

## 2019-12-31 DIAGNOSIS — U071 COVID-19: Secondary | ICD-10-CM | POA: Diagnosis not present

## 2020-01-19 DIAGNOSIS — J320 Chronic maxillary sinusitis: Secondary | ICD-10-CM | POA: Diagnosis not present

## 2020-01-20 DIAGNOSIS — Z20822 Contact with and (suspected) exposure to covid-19: Secondary | ICD-10-CM | POA: Diagnosis not present

## 2020-01-21 DIAGNOSIS — J019 Acute sinusitis, unspecified: Secondary | ICD-10-CM | POA: Diagnosis not present

## 2020-01-23 ENCOUNTER — Other Ambulatory Visit: Payer: Self-pay | Admitting: Internal Medicine

## 2020-01-23 DIAGNOSIS — I10 Essential (primary) hypertension: Secondary | ICD-10-CM

## 2020-02-10 ENCOUNTER — Encounter: Payer: Self-pay | Admitting: Psychiatry

## 2020-02-10 ENCOUNTER — Other Ambulatory Visit: Payer: Self-pay

## 2020-02-10 ENCOUNTER — Ambulatory Visit (INDEPENDENT_AMBULATORY_CARE_PROVIDER_SITE_OTHER): Payer: BC Managed Care – PPO | Admitting: Psychiatry

## 2020-02-10 VITALS — Ht 62.0 in | Wt 154.0 lb

## 2020-02-10 DIAGNOSIS — F411 Generalized anxiety disorder: Secondary | ICD-10-CM | POA: Diagnosis not present

## 2020-02-10 DIAGNOSIS — F3181 Bipolar II disorder: Secondary | ICD-10-CM | POA: Diagnosis not present

## 2020-02-10 DIAGNOSIS — F900 Attention-deficit hyperactivity disorder, predominantly inattentive type: Secondary | ICD-10-CM

## 2020-02-10 MED ORDER — AMPHETAMINE-DEXTROAMPHET ER 20 MG PO CP24: 20.0000 mg | ORAL_CAPSULE | Freq: Every day | ORAL | 0 refills | Status: DC

## 2020-02-10 MED ORDER — ZOLPIDEM TARTRATE 10 MG PO TABS: ORAL_TABLET | ORAL | 4 refills | Status: DC

## 2020-02-10 MED ORDER — BUPROPION HCL ER (XL) 150 MG PO TB24: 450.0000 mg | ORAL_TABLET | Freq: Every morning | ORAL | 5 refills | Status: DC

## 2020-02-10 MED ORDER — ALPRAZOLAM 1 MG PO TABS: ORAL_TABLET | ORAL | 2 refills | Status: DC

## 2020-02-10 NOTE — Progress Notes (Signed)
[name]  Patient ID: Tammy Randolph,  MRN: [id number]  PCP: Ann Held, DO  Date of Evaluation: 02/10/2020 Time spent:25 minutes from [id number] to [id number]  Chief Complaint:  Chief Complaint    Anxiety; ADHD; Depression; Manic Behavior      HISTORY/CURRENT STATUS: Tammy Randolph is seen onsite in office 20 minutes face-to-face individually with consent with epic collateral for psychiatric interview and exam in 47-month evaluation and management of her persisting conclusion of bipolar II from previous care of Dr. Candis Schatz from 2007-16, generalized anxiety, and ADHD with cluster B traits.  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  Patient is most stressed now by her mother and another female friend as mother caught COVID with Elfrida when they were getting food through [employer]  Her mother is very devaluing and selfish while patient still tries to be there for her.  She has had less interest and energy since her own COVID infection wondering if she is more depressed.  However she is off Cymbalta since October 5 and is off Lamictal and Viibryd, now taking only the Wellbutrin she restarted in place of Cymbalta, Adderall, Xanax and wanting Ambien again in place of Belsomra.  Only other medicines are testosterone cream from GYN for atrophic vaginitis and Coreg and spironolactone.  She has past treatment with Effexor, Prozac, Lexapro, Celexa, Intuniv, Seroquel, and Depakote.  She was shocked at how many medications are in her med log in epic when they were providing for her COVID needs, though she suggests she is taking all of her current mental health medications.  She has no mania, psychosis, suicidality, or delirium today.   Depression  The patient presents withdepression.This is a recurrentproblem.The current episode started more than 14 years ago. The onset quality is gradual. The problem occurs intermittently.The problem has been waxing and waningsince onset.Associated symptoms include decreased concentration,fatigue,insomnia,decreased interest,and sad. Associated symptoms include not irritable,no appetite change,no myalgias,no headaches,no indigestionno hopelessness, no helplessness, and no suicidal ideas.The symptoms are aggravated by family issues and social issues.Past treatments include psychotherapy, other medications and SSRIs - Selective serotonin reuptake inhibitors.Compliance with treatment is variable.Past compliance problems include medical issues, medication issues and difficulty with treatment plan.Risk factors include a change in medication usage/dosage, emotional abuse, family history, history of mental illness, major life event, marital problems and stress. Past medical history includes anxiety,bipolar disorder,depressionand mental health disorder. Pertinent negatives include no life-threatening condition,no physical disability,no recent psychiatric admission,no eating disorder,no obsessive-compulsive disorder,no post-traumatic stress disorder,no schizophrenia,no suicide attemptsand no head trauma.  Individual Medical History/ Review of Systems: Changes? :Yes Weight is down 6 pounds in the last 6 months as she reviews getting over COVID in a couple of weeks.  She describes still working for the [employer] and traveling with an older female friend whose way she pays.  Allergies: Hydrocodone-acetaminophen and Morphine  Current Medications:  Current Outpatient Medications:  .  albuterol (PROVENTIL HFA;VENTOLIN HFA) 108 (90 BASE) MCG/ACT inhaler, Inhale 1-2 puffs into the lungs every 6 (six) hours as needed for wheezing or shortness of breath., Disp:  , Rfl:  .  ALPRAZolam (XANAX) 1 MG tablet, TAKE 1 TABLET(1 MG) BY MOUTH FOUR TIMES DAILY AS NEEDED FOR ANXIETY, Disp: 120 tablet, Rfl: 2 .  amphetamine-dextroamphetamine (ADDERALL XR)  20 MG 24 hr capsule, Take 1 capsule (20 mg total) by mouth daily after breakfast., Disp: 30 capsule, Rfl: 0 .  [START ON 03/11/2020] amphetamine-dextroamphetamine (ADDERALL XR) 20 MG 24 hr capsule, Take 1 capsule (20 mg total) by mouth daily after breakfast., Disp: 30 capsule, Rfl: 0 .  [START ON 04/10/2020] amphetamine-dextroamphetamine (ADDERALL XR) 20 MG 24 hr capsule, Take 1 capsule (20 mg total) by mouth daily after breakfast., Disp: 30 capsule, Rfl: 0 .  buPROPion (WELLBUTRIN XL) 150 MG 24 hr tablet, Take 3 tablets (450 mg total) by mouth every morning., Disp: 90 tablet, Rfl: 5 .  carvedilol (COREG) 12.5 MG tablet, Take 1.5 tablets (18.75 mg total) by mouth 2 (two) times daily with a meal. Please schedule appt for future refills. 2nd attempt, Disp: 90 tablet, Rfl: 0 .  methocarbamol (ROBAXIN) 500 MG tablet, Take 1 tablet (500 mg total) by mouth 3 (three) times daily as needed for muscle spasms., Disp: 60 tablet, Rfl: 0 .  ondansetron (ZOFRAN) 4 MG tablet, Take 1 tablet (4 mg total) by mouth every 8 (eight) hours as needed for nausea or vomiting., Disp: 20 tablet, Rfl: 0 .  oxyCODONE-acetaminophen (PERCOCET) 10-325 MG tablet, Take 1 tablet by mouth every 4 (four) hours as needed for pain., Disp: 60 tablet, Rfl: 0 .  pseudoephedrine-acetaminophen (TYLENOL SINUS) 30-500 MG TABS, Take 1 tablet by mouth every 4 (four) hours as needed (Seasonal Allergies)., Disp: , Rfl:  .  spironolactone (ALDACTONE) 25 MG tablet, Take 1 tablet (25 mg total) by mouth daily. Please make annual appt with Dr. Lovena Le for future refills. Thank you. 2nd attempt., Disp: 15 tablet, Rfl: 0 .  SUMAtriptan (IMITREX) 50 MG tablet, as directed. TAKE 1 TABLET BY MOUTH AS NEEDED, Disp: , Rfl:  .  zolpidem (AMBIEN) 10 MG tablet, Take 1 pill total 10 mg by  mouth every bedtime, Disp: 30 tablet, Rfl: 4  Medication Side Effects: none  Family Medical/ Social History: Changes? Yes seeming to have renewed hope for her husband who is diligent in intensive therapy now for his debility while he is less hope for her mother who considers the patient unimportant compared to mother's wishes and needs.  MENTAL HEALTH EXAM:  Height 5\' 2"  (1.575 m), weight 154 lb (69.9 kg).Body mass index is 28.17 kg/m. Muscle strengths and tone 5/5, postural reflexes and gait 0/0, and AIMS = 0 otherwise deferred for coronavirus shutdown  General Appearance: Casual, Guarded, Meticulous and Well Groomed  Eye Contact:  Fair  Speech:  Clear and Coherent, Normal Rate and Talkative  Volume:  Normal  Mood:  Anxious, Dysphoric, Euthymic and Irritable  Affect:  Congruent, Depressed, Inappropriate, Full Range and Anxious  Thought Process:  Coherent, Goal Directed, Irrelevant, Linear and Descriptions of Associations: Tangential  Orientation:  Full (Time, Place, and Person)  Thought Content: Rumination and Tangential   Suicidal Thoughts:  No  Homicidal Thoughts:  No  Memory:  Immediate;   Good Remote;   Good  Judgement:  Fair  Insight:  Fair  Psychomotor Activity:  Normal, Decreased and Mannerisms  Concentration:  Concentration: Fair and Attention Span: Fair  Recall:  [employer] of Knowledge: Good  Language: Good  Assets:  Desire for Improvement Leisure Time Resilience Talents/Skills  ADL's:  Intact  Cognition: WNL  Prognosis:  Good    DIAGNOSES:    ICD-10-CM   1. Generalized anxiety disorder  F41.1 zolpidem (AMBIEN) 10 MG tablet    buPROPion (WELLBUTRIN XL) 150 MG 24 hr tablet  ALPRAZolam (XANAX) 1 MG tablet  2. Attention deficit hyperactivity disorder (ADHD), inattentive type, moderate  F90.0 buPROPion (WELLBUTRIN XL) 150 MG 24 hr tablet    amphetamine-dextroamphetamine (ADDERALL XR) 20 MG 24 hr capsule    amphetamine-dextroamphetamine (ADDERALL XR) 20 MG 24 hr  capsule  3. Bipolar II disorder, mild, depressed, with anxious distress, in partial remission (HCC)  F31.81 zolpidem (AMBIEN) 10 MG tablet    buPROPion (WELLBUTRIN XL) 150 MG 24 hr tablet    ALPRAZolam (XANAX) 1 MG tablet    Receiving Psychotherapy: No    RECOMMENDATIONS: Patient is not taking Cymbalta, Belsomra, Viibryd, or Lamictal.  However she continues list to moderate consistency her Xanax, Ambien, and Adderall while taking her Wellbutrin consistently.  Psycho supportive psychoeducation reworks all of the above conflicts and stressors for coping with time to overcome their undermining of daily sense of wellbeing and allow her to restore the elements of affective and behavioral recovery for cognitive excellence.  She is E scribed Wellbutrin 150 mg XL tablets taking 3 tablets total 450 mg every morning sent as #90 with 5 refills to [facility] on [address] for depression, anxiety, and ADHD.  She is E scribed Adderall 20 mg XR every morning after breakfast Cinthya's #30 each 4 February 15, March 17, and April 16 for ADHD.  She is E scribed Xanax 1 mg 4 times daily as needed for anxiety agitation sent as #120 with 2 refills to [facility] on [address] for generalized anxiety and bipolar.  He is described Ambien 10 mg every bedtime as #30 with 4 refills for insomnia associated with anxiety and depression sent to  [facility] on [address].  She returns for follow-up in 6 months or sooner if needed.   Delight Hoh, MD

## 2020-02-27 ENCOUNTER — Other Ambulatory Visit: Payer: Self-pay | Admitting: Internal Medicine

## 2020-02-27 DIAGNOSIS — N390 Urinary tract infection, site not specified: Secondary | ICD-10-CM | POA: Diagnosis not present

## 2020-03-19 ENCOUNTER — Telehealth: Payer: Self-pay | Admitting: Psychiatry

## 2020-03-19 NOTE — Telephone Encounter (Signed)
Left patient a message to confirm her pharmacy, she has 2 Rx's for Adderall at [facility] on [facility] in [place] but sounds like she's needing a change in pharmacy but unclear which one.

## 2020-03-19 NOTE — Telephone Encounter (Signed)
Pt needs refill on ADDERALL. Please send to [facility] on [address].

## 2020-03-22 ENCOUNTER — Other Ambulatory Visit: Payer: Self-pay | Admitting: Internal Medicine

## 2020-03-22 DIAGNOSIS — I10 Essential (primary) hypertension: Secondary | ICD-10-CM

## 2020-04-18 DIAGNOSIS — J0110 Acute frontal sinusitis, unspecified: Secondary | ICD-10-CM | POA: Diagnosis not present

## 2020-04-22 ENCOUNTER — Other Ambulatory Visit: Payer: Self-pay

## 2020-04-22 MED ORDER — SPIRONOLACTONE 25 MG PO TABS: ORAL_TABLET | ORAL | 0 refills | Status: DC

## 2020-05-21 ENCOUNTER — Other Ambulatory Visit: Payer: Self-pay | Admitting: Psychiatry

## 2020-05-21 DIAGNOSIS — F900 Attention-deficit hyperactivity disorder, predominantly inattentive type: Secondary | ICD-10-CM

## 2020-05-21 MED ORDER — AMPHETAMINE-DEXTROAMPHET ER 20 MG PO CP24: 20.0000 mg | ORAL_CAPSULE | Freq: Every day | ORAL | 0 refills | Status: DC

## 2020-05-21 NOTE — Telephone Encounter (Signed)
Please send in her next three Adderall Rx to the pharmacy on file-  [facility] in [place], [place]. Pt to call back to make appt in Aug.

## 2020-05-21 NOTE — Telephone Encounter (Signed)
As per last month, the patient expects the office to seed a fill for today for Adderall 20 mg XR daily when she already has dispensed fill available at [facility] on [address] for pick up in [place].  She then requests that following fill before return to her next appointment  Be sent but we can only send one of the 3 potential fill dates by phone today for the interim to be filled 06/20/2020.

## 2020-05-21 NOTE — Telephone Encounter (Signed)
Looks like patient should already have 1 on file at pharmacy, will contact them to confirm.

## 2020-06-07 DIAGNOSIS — N3000 Acute cystitis without hematuria: Secondary | ICD-10-CM | POA: Diagnosis not present

## 2020-06-13 DIAGNOSIS — N3000 Acute cystitis without hematuria: Secondary | ICD-10-CM | POA: Diagnosis not present

## 2020-06-19 DIAGNOSIS — M21612 Bunion of left foot: Secondary | ICD-10-CM | POA: Diagnosis not present

## 2020-06-19 DIAGNOSIS — M722 Plantar fascial fibromatosis: Secondary | ICD-10-CM | POA: Diagnosis not present

## 2020-07-09 ENCOUNTER — Telehealth: Payer: Self-pay | Admitting: Psychiatry

## 2020-07-09 NOTE — Telephone Encounter (Signed)
Patient should already have Rx's on file, will contact them to confirm

## 2020-07-09 NOTE — Telephone Encounter (Signed)
Pt is requesting a refill on her Ambien and Adderall to be filled at the [facility] in [place]. Next appt scheduled for 7/28.

## 2020-07-22 ENCOUNTER — Ambulatory Visit: Payer: BC Managed Care – PPO | Admitting: Psychiatry

## 2020-07-22 ENCOUNTER — Encounter: Payer: Self-pay | Admitting: Psychiatry

## 2020-07-22 ENCOUNTER — Other Ambulatory Visit: Payer: Self-pay

## 2020-07-22 ENCOUNTER — Ambulatory Visit (INDEPENDENT_AMBULATORY_CARE_PROVIDER_SITE_OTHER): Payer: BC Managed Care – PPO | Admitting: Psychiatry

## 2020-07-22 VITALS — BP 134/96 | HR 78 | Ht 62.0 in | Wt 156.0 lb

## 2020-07-22 DIAGNOSIS — F900 Attention-deficit hyperactivity disorder, predominantly inattentive type: Secondary | ICD-10-CM | POA: Diagnosis not present

## 2020-07-22 DIAGNOSIS — F411 Generalized anxiety disorder: Secondary | ICD-10-CM | POA: Diagnosis not present

## 2020-07-22 DIAGNOSIS — F3181 Bipolar II disorder: Secondary | ICD-10-CM

## 2020-07-22 MED ORDER — AMPHETAMINE-DEXTROAMPHET ER 20 MG PO CP24: 20.0000 mg | ORAL_CAPSULE | Freq: Every day | ORAL | 0 refills | Status: DC

## 2020-07-22 MED ORDER — ZOLPIDEM TARTRATE 10 MG PO TABS: ORAL_TABLET | ORAL | 4 refills | Status: DC

## 2020-07-22 MED ORDER — ALPRAZOLAM 1 MG PO TABS: ORAL_TABLET | ORAL | 2 refills | Status: DC

## 2020-07-22 MED ORDER — BUPROPION HCL ER (XL) 150 MG PO TB24: 450.0000 mg | ORAL_TABLET | Freq: Every morning | ORAL | 5 refills | Status: DC

## 2020-07-22 NOTE — Progress Notes (Signed)
[name]  Patient ID: Tammy Randolph,  MRN: [id number]  PCP: Ann Held, DO  Date of Evaluation: 07/22/2020 Time spent:25 minutes from 1530 to 1555  Chief Complaint:  Chief Complaint    Anxiety; ADHD; Depression      HISTORY/CURRENT STATUS: Tammy Randolph is seen onsite in office 25 minutes face-to-face individually with consent with epic collateral for psychiatric interview and exam in 67-month evaluation and management of generalized anxiety, ADHD, and longstanding diagnosis of bipolar II currently depressed with anxious distress.  She began care in this office 14 years ago with Dr. Candis Schatz.  She has recently returned from the [place] and her weight reduction of 7 pounds prior to that is now back up 2 pounds from last appointment spear fishing and [place].  She continues discontinuation of Cymbalta, Belsomra, Viibryd, or Lamictal after reviewing with primary care her medication log having past psychiatric treatment with Effexor, Prozac, Lexapro, Celexa, Intuniv, Seroquel, and Depakote.  Therefore, she has four current medications only Wellbutrin being absolutely daily.Marland Kitchen  However in the [place] she did become alerted that her blood pressure was elevted therefore taken 4 days off of Adderall after measurement of 173/130.  She has primary care appointment 08/07/2020 for further treatment being on Aldactone and Coreg with blood pressure only mildly elevated today in this office.  [place] registry documents last Adderall dosing 07/09/2020.  She has established an activity schedule and support for others including family members from mother to her daughter not becoming victim to drinking and infidelity of husband.  Daughter has experienced loss of one of her twins at [redacted] weeks gestation related to patient, maternal aunt, maternal cousin and others.  Patient reviews her spiritual foundation and directed economics by which she reports security she can cope with their perturbations in daily  life.  She has no mania, suicidality, psychosis or delirium as she requests to continue her maintenance medications and also addresses blood pressure with primary care.  Depression The patient presents withdepression as a recurrentproblem of at least 15 years.The most recent episode started last year. The onset quality is gradual. The problem occurs intermittently.The problem has been waxing and waningsince onset.Associated symptoms include decreased concentration,fatigue,insomnia,decreased interest,and reactively worried and sad. Associated symptoms include not irritable,no appetite change,no myalgias,no headaches,no indigestionno hopelessness, no helplessness, and no suicidal ideas.The symptoms are aggravated by family issues and social issues.Past treatments include psychotherapy, other medications and SSRIs - Selective serotonin reuptake inhibitors.Compliance with treatment is variable.Past compliance problems include medical issues, medication issues and difficulty with treatment plan.Risk factors include a change in medication usage/dosage, emotional abuse, family history, history of mental illness, major life event, marital problems and stress. Past medical history includes anxiety,bipolar disorder,depressionand mental health disorder. Pertinent negatives include no life-threatening condition,no physical disability,no recent psychiatric admission,no eating disorder,no obsessive-compulsive disorder,no post-traumatic stress disorder,no schizophrenia,no suicide attemptsand no head trauma.  Individual Medical History/ Review of Systems: Changes? :Yes  blood pressure 173/130 in the [place] off Adderall  Allergies: Hydrocodone-acetaminophen and Morphine  Current Medications:  Current Outpatient Medications:  .  albuterol (PROVENTIL HFA;VENTOLIN HFA) 108 (90 BASE) MCG/ACT inhaler, Inhale 1-2 puffs into the lungs every 6 (six) hours as needed  for wheezing or shortness of breath., Disp: , Rfl:  .  ALPRAZolam (XANAX) 1 MG tablet, TAKE 1 TABLET(1 MG) BY MOUTH FOUR TIMES DAILY AS NEEDED FOR ANXIETY, Disp: 120 tablet, Rfl: 2 .  [START ON 08/08/2020] amphetamine-dextroamphetamine (ADDERALL XR) 20 MG 24 hr capsule, Take 1 capsule (20 mg total) by  mouth daily after breakfast., Disp: 30 capsule, Rfl: 0 .  [START ON 10/07/2020] amphetamine-dextroamphetamine (ADDERALL XR) 20 MG 24 hr capsule, Take 1 capsule (20 mg total) by mouth daily after breakfast., Disp: 30 capsule, Rfl: 0 .  amphetamine-dextroamphetamine (ADDERALL XR) 20 MG 24 hr capsule, Take 1 capsule (20 mg total) by mouth daily after breakfast., Disp: 30 capsule, Rfl: 0 .  buPROPion (WELLBUTRIN XL) 150 MG 24 hr tablet, Take 3 tablets (450 mg total) by mouth every morning., Disp: 90 tablet, Rfl: 5 .  carvedilol (COREG) 12.5 MG tablet, Take 1.5 tablets (18.75 mg total) by mouth 2 (two) times daily with a meal. Please make overdue appt with Dr. Lovena Le before anymore refills. 3rd and Final Attempt, Disp: 45 tablet, Rfl: 0 .  methocarbamol (ROBAXIN) 500 MG tablet, Take 1 tablet (500 mg total) by mouth 3 (three) times daily as needed for muscle spasms., Disp: 60 tablet, Rfl: 0 .  ondansetron (ZOFRAN) 4 MG tablet, Take 1 tablet (4 mg total) by mouth every 8 (eight) hours as needed for nausea or vomiting., Disp: 20 tablet, Rfl: 0 .  oxyCODONE-acetaminophen (PERCOCET) 10-325 MG tablet, Take 1 tablet by mouth every 4 (four) hours as needed for pain., Disp: 60 tablet, Rfl: 0 .  pseudoephedrine-acetaminophen (TYLENOL SINUS) 30-500 MG TABS, Take 1 tablet by mouth every 4 (four) hours as needed (Seasonal Allergies)., Disp: , Rfl:  .  spironolactone (ALDACTONE) 25 MG tablet, TAKE 1 TABLET BY MOUTH DAILY. Please make overdue appt with Dr. Lovena Le before anymore refills. 2nd attempt, Disp: 15 tablet, Rfl: 0 .  SUMAtriptan (IMITREX) 50 MG tablet, as directed. TAKE 1 TABLET BY MOUTH AS NEEDED, Disp: , Rfl:  .   zolpidem (AMBIEN) 10 MG tablet, Take 1 pill total 10 mg by mouth every bedtime, Disp: 30 tablet, Rfl: 4  Medication Side Effects: none  Family Medical/ Social History: Changes? Yes daughter's miscarriage of one of her twin gestations follows family pattern.  MENTAL HEALTH EXAM:  Blood pressure (!) 134/96, pulse 78, height 5\' 2"  (1.575 m), weight 156 lb (70.8 kg).Body mass index is 28.53 kg/m.  General Appearance: Casual, Meticulous and Well Groomed  Eye Contact:  Good  Speech:  Clear and Coherent, Normal Rate and Talkative  Volume:  Normal  Mood:  Anxious, Dysphoric and Euthymic  Affect:  Congruent, Full Range and Anxious  Thought Process:  Coherent, Goal Directed, Linear and Descriptions of Associations: Tangential  Orientation:  Full (Time, Place, and Person)  Thought Content: Rumination and Tangential   Suicidal Thoughts:  No  Homicidal Thoughts:  No  Memory:  Immediate;   Good Remote;   Good  Judgement:  Good  Insight:  Fair  Psychomotor Activity:  Normal and Mannerisms  Concentration:  Concentration: Fair and Attention Span: Fair  Recall:  [employer] of Knowledge: Good  Language: Good  Assets:  Desire for Improvement Leisure Time Resilience Talents/Skills  ADL's:  Intact  Cognition: WNL  Prognosis:  Good    DIAGNOSES:    ICD-10-CM   1. Generalized anxiety disorder  F41.1 zolpidem (AMBIEN) 10 MG tablet    buPROPion (WELLBUTRIN XL) 150 MG 24 hr tablet    ALPRAZolam (XANAX) 1 MG tablet  2. Attention deficit hyperactivity disorder (ADHD), inattentive type, moderate  F90.0 buPROPion (WELLBUTRIN XL) 150 MG 24 hr tablet    amphetamine-dextroamphetamine (ADDERALL XR) 20 MG 24 hr capsule    amphetamine-dextroamphetamine (ADDERALL XR) 20 MG 24 hr capsule    amphetamine-dextroamphetamine (ADDERALL XR) 20 MG 24  hr capsule  3. Bipolar II disorder, mild, depressed, with anxious distress, in partial remission (HCC)  F31.81 zolpidem (AMBIEN) 10 MG tablet    buPROPion (WELLBUTRIN  XL) 150 MG 24 hr tablet    ALPRAZolam (XANAX) 1 MG tablet    Receiving Psychotherapy: No     RECOMMENDATIONS: Psycho supportive psychoeducation integrates prevention and monitoring safety hygiene cognitive behavioral nutrition, sleep hygiene, social problem-solving and frustration management with symptom treatment matching for medication.  She is E scribed to continue Wellbutrin 150 mg XL taking 3 tablets total 450 mg every morning sent as #90 with 5 refills to [facility] on [address] for depression and anxiety.  She is E scribed Adderall 20 mg XR daily in the morning when blood pressure is controlled #30 each for August 14, September 13, and October 13th for ADHD sent to [facility] on [address].  She is E scribed Ambien 10 mg every bedtime as needed for insomnia #30 with no refills sent to [facility] on [address] for generalized anxiety and depression.  She is E scribed Xanax 1 mg 4 times daily as needed for high anxiety or panic sent as #120 with 3 refills to [facility] on [address] for generalized anxiety disorder.  She returns for follow-up in 6 months or sooner if needed seeing primary care August 13 for her Coreg and Aldactone treatment of hypertension.   Delight Hoh, MD

## 2020-07-23 ENCOUNTER — Encounter: Payer: Self-pay | Admitting: Psychiatry

## 2020-08-06 NOTE — Progress Notes (Signed)
Cardiology Office Note Date:  08/07/2020  Patient ID:  Tammy Randolph, Tammy Randolph 05-28-1961, MRN [id number] PCP:  Ann Held, DO  Cardiologist:  Dr. Lovena Le   Chief Complaint:  Annal visit   History of Present Illness: Tammy Randolph is a 59 y.o. female with history of HTN, anxiety, Biopolar disorder, ADD.   She comes today to be seen for Dr. Lovena Le.  She was last seen by him in Oct 2017, at that time, he mentions hx of CP that had been w/u with a neg ETT, and had been quiet.  I saw her 01/10/2018 She feels well, no CP, palpitations, or SOB.  She had 2 rotator cuff surgeries last year and gout out of her usual exercise routine, though denies any exertional intolerances.  No dizziness, near syncope or syncope, tolerating her medicines well.   BP was a bit high though reported as unusual for her No changes were made, planned for annual visit   TODAY All in all doing OK. She has noted her BP up of late, she was in vacation/checking on a job site in the [place] not too long ago, was seen in a clinic there for UTI her BP was noted very high (170/120) and her coreg up titrated.  They did have her back and was lower, but still not controlled and recommended she follow up once home. She has been having some headaches as well. Occasional feels her heart is fast can last a few minutes, not exertional or positional, no clear trigger Infrequently feels fleetingly lightheaded, not with change of position, also no clear trigger, is somewhat infrequent. A fleeting momentary high L chest pain, not positional or exertional.  None of these symptoms are associated with each other.  She sees a MD in [place] that functions as her PMD, he did a coronary CT last year with a score of zero  She discussed significant life stressors, and suspects these drive much of her symptoms and BP elevation.  Past Medical History:  Diagnosis Date  . Adenomatous colon polyp 1996  . Anxiety   . ANXIETY  07/15/2008   Qualifier: Diagnosis of  By: Ronnald Ramp CMA, Chemira    . Arthritis   . Asthma   . Bipolar disorder ([place])   . Chronic fatigue   . Depression   . History of blood transfusion 2003  . Hypertension   . Pneumonia    couple of times last being last year  . PONV (postoperative nausea and vomiting)   . Seasonal allergies   . Spinal headache     Past Surgical History:  Procedure Laterality Date  . ABDOMINAL HYSTERECTOMY  2004   and USO for endometriosis  . ANTERIOR CERVICAL DECOMP/DISCECTOMY FUSION N/A 09/23/2015   Procedure: ANTERIOR CERVICAL DISCECTOMY FUSION C4-7     (3 LEVELS);  Surgeon: Melina Schools, MD;  Location: [facility];  Service: Orthopedics;  Laterality: N/A;  . APPENDECTOMY  1988  . BREAST ENHANCEMENT SURGERY Bilateral 2008  . COLONOSCOPY W/ POLYPECTOMY  2013   Dr Fuller Plan  . HYSTERECTOMY ABDOMINAL WITH SALPINGECTOMY     x4 for endometriosis  . KNEE ARTHROSCOPY WITH ANTERIOR CRUCIATE LIGAMENT (ACL) REPAIR Right 01/26/2013  . LUMBAR LAMINECTOMY  2008  . TONSILLECTOMY  2009    Current Outpatient Medications  Medication Sig Dispense Refill  . albuterol (PROVENTIL HFA;VENTOLIN HFA) 108 (90 BASE) MCG/ACT inhaler Inhale 1-2 puffs into the lungs every 6 (six) hours as needed for wheezing or shortness of breath.    Marland Kitchen  ALPRAZolam (XANAX) 1 MG tablet TAKE 1 TABLET(1 MG) BY MOUTH FOUR TIMES DAILY AS NEEDED FOR ANXIETY 120 tablet 2  . [START ON 08/08/2020] amphetamine-dextroamphetamine (ADDERALL XR) 20 MG 24 hr capsule Take 1 capsule (20 mg total) by mouth daily after breakfast. 30 capsule 0  . buPROPion (WELLBUTRIN XL) 150 MG 24 hr tablet Take 3 tablets (450 mg total) by mouth every morning. 90 tablet 5  . carvedilol (COREG) 12.5 MG tablet Take 1.5 tablets (18.75 mg total) by mouth 2 (two) times daily with a meal. Please make overdue appt with Dr. Lovena Le before anymore refills. 3rd and Final Attempt 45 tablet 0  . ondansetron (ZOFRAN) 4 MG tablet Take 1 tablet (4 mg total) by mouth  every 8 (eight) hours as needed for nausea or vomiting. 20 tablet 0  . oxyCODONE-acetaminophen (PERCOCET) 10-325 MG tablet Take 1 tablet by mouth every 4 (four) hours as needed for pain. 60 tablet 0  . pseudoephedrine-acetaminophen (TYLENOL SINUS) 30-500 MG TABS Take 1 tablet by mouth every 4 (four) hours as needed (Seasonal Allergies).    Marland Kitchen spironolactone (ALDACTONE) 25 MG tablet TAKE 1 TABLET BY MOUTH DAILY. Please make overdue appt with Dr. Lovena Le before anymore refills. 2nd attempt 15 tablet 0  . SUMAtriptan (IMITREX) 50 MG tablet as directed. TAKE 1 TABLET BY MOUTH AS NEEDED    . zolpidem (AMBIEN) 10 MG tablet Take 1 pill total 10 mg by mouth every bedtime 30 tablet 4  . meloxicam (MOBIC) 15 MG tablet Take 15 mg by mouth daily.     No current facility-administered medications for this visit.    Allergies:   Hydrocodone-acetaminophen and Morphine   Social History:  The patient  reports that she quit smoking about 33 years ago. She has never used smokeless tobacco. She reports current alcohol use. She reports that she does not use drugs.   Family History:  The patient's family history includes Arthritis in her mother; Breast cancer in her mother; CVA in her father and mother; Clotting disorder in her brother; Colon cancer (age of onset: 25) in her brother and maternal grandmother; Colon cancer (age of onset: 60) in her paternal uncle; Colon polyps in her brother; Coronary artery disease in her paternal aunt; Heart Problems in her mother; Heart attack in her maternal uncle; Hypertension in her mother, sister, and sister; Pancreatic cancer in her paternal uncle; Sleep apnea in her father and sister; Stomach cancer (age of onset: 44) in her maternal uncle.  ROS:  Please see the history of present illness.    All other systems are reviewed and otherwise negative.   PHYSICAL EXAM: VS:  BP 130/88   Pulse 82   Ht 5\' 5"  (1.651 m)   Wt 157 lb (71.2 kg)   BMI 26.13 kg/m  BMI: Body mass index is  26.13 kg/m. Well nourished, well developed, in no acute distress  HEENT: normocephalic, atraumatic  Neck: no JVD, carotid bruits or masses Cardiac:  RRR; no significant murmurs, no rubs, or gallops Lungs:  CTA b/l, no wheezing, rhonchi or rales  Abd: soft, nontender MS: no deformity, no atrophy Ext: no edema  Skin: warm and dry, no rash Neuro:  No gross deficits appreciated Psych: euthymic mood, full affect     EKG:  Done today and reviewed by myself is  SR 82bpm, normal  09/13/2019: [place] Ca scoring IMPRESSION: Coronary calcium score of 0, corresponding with no identifiable  plaque and a very low risk of coronary heart disease.  09/21/16: ETT  Blood pressure demonstrated a hypertensive response to exercise.  There was no ST segment deviation noted during stress. Good exercise capacity. Hypertensive response to exercise (193/100 mmHg). No ischemia.    Recent Labs: No results found for requested labs within last 8760 hours.  No results found for requested labs within last 8760 hours.   CrCl cannot be calculated (Patient's most recent lab result is older than the maximum 21 days allowed.).   Wt Readings from Last 3 Encounters:  08/07/20 157 lb (71.2 kg)  01/10/18 155 lb (70.3 kg)  07/18/17 140 lb (63.5 kg)     Other studies reviewed: Additional studies/records reviewed today include: summarized above  ASSESSMENT AND PLAN:  1. HTN     better here today  2. Palpitations, fleeting dizzy spells    Home generally 150's/90's She only uses the Adderall, tylenol sinus, Imitrex PRN and very infrequently  PMD follows labs   She is asked to keep a daily BP Log Reduce sodium, regular exercise, work on stress management And have her see the BP clinic in a few weeks Get an echo to evaluate for hypertensive heart disease and Zio XT    Disposition: BP clinic in 4-6 weeks, GT in 29mo, sooner if needed  Current medicines are reviewed at length with the  patient today.  The patient did not have any concerns regarding medicines.  Venetia Night, PA-C 08/07/2020 10:04 AM     [facility] [place] [address] [phone] (office)  [phone] (fax)

## 2020-08-07 ENCOUNTER — Telehealth: Payer: Self-pay | Admitting: Radiology

## 2020-08-07 ENCOUNTER — Other Ambulatory Visit: Payer: Self-pay

## 2020-08-07 ENCOUNTER — Ambulatory Visit: Payer: BC Managed Care – PPO | Admitting: Physician Assistant

## 2020-08-07 VITALS — BP 130/88 | HR 82 | Ht 65.0 in | Wt 157.0 lb

## 2020-08-07 DIAGNOSIS — I10 Essential (primary) hypertension: Secondary | ICD-10-CM | POA: Diagnosis not present

## 2020-08-07 DIAGNOSIS — R42 Dizziness and giddiness: Secondary | ICD-10-CM

## 2020-08-07 DIAGNOSIS — R002 Palpitations: Secondary | ICD-10-CM

## 2020-08-07 NOTE — Patient Instructions (Signed)
Medication Instructions:    Your physician recommends that you continue on your current medications as directed. Please refer to the Current Medication list given to you today.\  *If you need a refill on your cardiac medications before your next appointment, please call your pharmacy*   Lab Work: NONE ORDERED  TODAY   If you have labs (blood work) drawn today and your tests are completely normal, you will receive your results only by: Marland Kitchen MyChart Message (if you have MyChart) OR . A paper copy in the mail If you have any lab test that is abnormal or we need to change your treatment, we will call you to review the results.   Testing/Procedures: Your physician has requested that you have an echocardiogram. Echocardiography is a painless test that uses sound waves to create images of your heart. It provides your doctor with information about the size and shape of your heart and how well your heart's chambers and valves are working. This procedure takes approximately one hour. There are no restrictions for this procedure.  Your physician has recommended that you wear an event monitor. Event monitors are medical devices that record the heart's electrical activity. Doctors most often [place] these monitors to diagnose arrhythmias. Arrhythmias are problems with the speed or rhythm of the heartbeat. The monitor is a small, portable device. You can wear one while you do your normal daily activities. This is usually used to diagnose what is causing palpitations/syncope (passing out).     Follow-Up: At [facility], you and your health needs are our priority.  As part of our continuing mission to provide you with exceptional heart care, we have created designated Provider Care Teams.  These Care Teams include your primary Cardiologist (physician) and Advanced Practice Providers (APPs -  Physician Assistants and Nurse Practitioners) who all work together to provide you with the care you need, when you need  it.  We recommend signing up for the patient portal called "MyChart".  Sign up information is provided on this After Visit Summary.  MyChart is used to connect with patients for Virtual Visits (Telemedicine).  Patients are able to view lab/test results, encounter notes, upcoming appointments, etc.  Non-urgent messages can be sent to your provider as well.   To learn more about what you can do with MyChart, go to [email or web address].    Your next appointment:   4-6  week(s)  The format for your next appointment:    In Person with  [place] physician wants you to follow-up in:  IN  [place] will receive a reminder letter in the mail two months in advance. If you don't receive a letter, please call our office to schedule the follow-up appointment.   Other Instructions   ZIO XT- Long Term Monitor Instructions   Your physician has requested you wear your ZIO patch monitor___14____days.   This is a single patch monitor.  [employer] supplies one patch monitor per enrollment.  Additional stickers are not available.   Please do not apply patch if you will be having a Nuclear Stress Test, Echocardiogram, Cardiac CT, MRI, or Chest Xray during the time frame you would be wearing the monitor. The patch cannot be worn during these tests.  You cannot remove and re-apply the ZIO XT patch monitor.   Your ZIO patch monitor will be sent USPS Priority mail from [facility] directly to your home address. The monitor  may also be mailed to a PO BOX if home delivery is not available.   It may take 3-5 days to receive your monitor after you have been enrolled.   Once you have received you monitor, please review enclosed instructions.  Your monitor has already been registered assigning a specific monitor serial # to you.   Applying the monitor   Shave hair from upper left chest.   Hold abrader disc by orange tab.  Rub abrader in 40 strokes over  left upper chest as indicated in your monitor instructions.   Clean area with 4 enclosed alcohol pads .  Use all pads to assure are is cleaned thoroughly.  Let dry.   Apply patch as indicated in monitor instructions.  Patch will be place under collarbone on left side of chest with arrow pointing upward.   Rub patch adhesive wings for 2 minutes.Remove white label marked "1".  Remove white label marked "2".  Rub patch adhesive wings for 2 additional minutes.   While looking in a mirror, press and release button in center of patch.  A small green light will flash 3-4 times .  This will be your only indicator the monitor has been turned on.     Do not shower for the first 24 hours.  You may shower after the first 24 hours.   Press button if you feel a symptom. You will hear a small click.  Record Date, Time and Symptom in the Patient Log Book.   When you are ready to remove patch, follow instructions on last 2 pages of Patient Log Book.  Stick patch monitor onto last page of Patient Log Book.   Place Patient Log Book in [place] box.  Use locking tab on box and tape box closed securely.  The [employer] has [employer] on it.  Please place in mailbox as soon as possible.  Your physician should have your test results approximately 7 days after the monitor has been mailed [facility] to [facility].   Call [place] at [phone] if you have questions regarding your ZIO XT patch monitor.  Call them immediately if you see an orange light blinking on your monitor.   If your monitor falls off in less than 4 days contact our Monitor department at [phone].  If your monitor becomes loose or falls off after 4 days call [employer] at [phone] for suggestions on securing your monitor.

## 2020-08-07 NOTE — Telephone Encounter (Signed)
Enrolled patient for a 14 day Zio XT  monitor to be mailed to patients home  °

## 2020-08-08 ENCOUNTER — Other Ambulatory Visit: Payer: Self-pay | Admitting: Internal Medicine

## 2020-08-08 DIAGNOSIS — I10 Essential (primary) hypertension: Secondary | ICD-10-CM

## 2020-08-12 DIAGNOSIS — J0110 Acute frontal sinusitis, unspecified: Secondary | ICD-10-CM | POA: Diagnosis not present

## 2020-08-22 ENCOUNTER — Other Ambulatory Visit (INDEPENDENT_AMBULATORY_CARE_PROVIDER_SITE_OTHER): Payer: BC Managed Care – PPO

## 2020-08-22 DIAGNOSIS — R002 Palpitations: Secondary | ICD-10-CM

## 2020-08-26 DIAGNOSIS — U071 COVID-19: Secondary | ICD-10-CM | POA: Diagnosis not present

## 2020-08-26 DIAGNOSIS — J069 Acute upper respiratory infection, unspecified: Secondary | ICD-10-CM | POA: Diagnosis not present

## 2020-09-01 DIAGNOSIS — J018 Other acute sinusitis: Secondary | ICD-10-CM | POA: Diagnosis not present

## 2020-09-01 DIAGNOSIS — J069 Acute upper respiratory infection, unspecified: Secondary | ICD-10-CM | POA: Diagnosis not present

## 2020-09-02 ENCOUNTER — Other Ambulatory Visit (HOSPITAL_COMMUNITY): Payer: BC Managed Care – PPO

## 2020-09-02 ENCOUNTER — Ambulatory Visit: Payer: BC Managed Care – PPO

## 2020-09-06 ENCOUNTER — Other Ambulatory Visit: Payer: Self-pay

## 2020-09-06 ENCOUNTER — Encounter (HOSPITAL_COMMUNITY): Payer: Self-pay | Admitting: Emergency Medicine

## 2020-09-06 ENCOUNTER — Emergency Department (HOSPITAL_COMMUNITY)
Admission: EM | Admit: 2020-09-06 | Discharge: 2020-09-06 | Disposition: A | Discharge status: Home / Self Care | Payer: BC Managed Care – PPO | Attending: Emergency Medicine | Admitting: Emergency Medicine

## 2020-09-06 DIAGNOSIS — Z79899 Other long term (current) drug therapy: Secondary | ICD-10-CM | POA: Insufficient documentation

## 2020-09-06 DIAGNOSIS — R05 Cough: Secondary | ICD-10-CM | POA: Insufficient documentation

## 2020-09-06 DIAGNOSIS — I10 Essential (primary) hypertension: Secondary | ICD-10-CM | POA: Insufficient documentation

## 2020-09-06 DIAGNOSIS — Z87891 Personal history of nicotine dependence: Secondary | ICD-10-CM | POA: Diagnosis not present

## 2020-09-06 DIAGNOSIS — U071 COVID-19: Secondary | ICD-10-CM | POA: Diagnosis not present

## 2020-09-06 DIAGNOSIS — F909 Attention-deficit hyperactivity disorder, unspecified type: Secondary | ICD-10-CM | POA: Diagnosis not present

## 2020-09-06 DIAGNOSIS — J45909 Unspecified asthma, uncomplicated: Secondary | ICD-10-CM | POA: Diagnosis not present

## 2020-09-06 DIAGNOSIS — R5383 Other fatigue: Secondary | ICD-10-CM | POA: Diagnosis not present

## 2020-09-06 DIAGNOSIS — R059 Cough, unspecified: Secondary | ICD-10-CM

## 2020-09-06 LAB — COMPREHENSIVE METABOLIC PANEL
ALT: 19 U/L (ref 0–44)
AST: 23 U/L (ref 15–41)
Albumin: 4.7 g/dL (ref 3.5–5.0)
Alkaline Phosphatase: 92 U/L (ref 38–126)
Anion gap: 11 (ref 5–15)
BUN: 15 mg/dL (ref 6–20)
CO2: 29 mmol/L (ref 22–32)
Calcium: 9.8 mg/dL (ref 8.9–10.3)
Chloride: 97 mmol/L — ABNORMAL LOW (ref 98–111)
Creatinine, Ser: 0.85 mg/dL (ref 0.44–1.00)
GFR calc Af Amer: >60 mL/min (ref >60)
GFR calc non Af Amer: >60 mL/min (ref >60)
Glucose, Bld: 94 mg/dL (ref 70–99)
Potassium: 4 mmol/L (ref 3.5–5.1)
Sodium: 137 mmol/L (ref 135–145)
Total Bilirubin: 0.4 mg/dL (ref 0.3–1.2)
Total Protein: 8.1 g/dL (ref 6.5–8.1)

## 2020-09-06 LAB — CBC WITH DIFFERENTIAL/PLATELET
Abs Immature Granulocytes: 0.02 10*3/uL (ref 0.00–0.07)
Basophils Absolute: 0.1 10*3/uL (ref 0.0–0.1)
Basophils Relative: 1 %
Eosinophils Absolute: 0.4 10*3/uL (ref 0.0–0.5)
Eosinophils Relative: 6 %
HCT: 46 % (ref 36.0–46.0)
Hemoglobin: 15.4 g/dL — ABNORMAL HIGH (ref 12.0–15.0)
Immature Granulocytes: 0 %
Lymphocytes Relative: 35 %
Lymphs Abs: 2.4 10*3/uL (ref 0.7–4.0)
MCH: 30.1 pg (ref 26.0–34.0)
MCHC: 33.5 g/dL (ref 30.0–36.0)
MCV: 90 fL (ref 80.0–100.0)
Monocytes Absolute: 0.7 10*3/uL (ref 0.1–1.0)
Monocytes Relative: 10 %
Neutro Abs: 3.3 10*3/uL (ref 1.7–7.7)
Neutrophils Relative %: 48 %
Platelets: 262 10*3/uL (ref 150–400)
RBC: 5.11 MIL/uL (ref 3.87–5.11)
RDW: 12.2 % (ref 11.5–15.5)
WBC: 6.8 10*3/uL (ref 4.0–10.5)
nRBC: 0 % (ref 0.0–0.2)

## 2020-09-06 LAB — TROPONIN I (HIGH SENSITIVITY): Troponin I (High Sensitivity): 2 ng/L (ref <18)

## 2020-09-06 LAB — D-DIMER, QUANTITATIVE: D-Dimer, Quant: 0.42 ug/mL-FEU (ref 0.00–0.50)

## 2020-09-06 MED ORDER — BENZONATATE 100 MG PO CAPS: 100.0000 mg | ORAL_CAPSULE | Freq: Three times a day (TID) | ORAL | 0 refills | Status: DC | PRN

## 2020-09-06 NOTE — ED Triage Notes (Signed)
Patient here from [facility] urgent care reporting fatigue and cough x4 weeks. States that she was diagnosed with covid 4 weeks ago and still feels tired.

## 2020-09-06 NOTE — ED Provider Notes (Signed)
[facility] DEPT Provider Note   CSN: [id number] Arrival date & time: 09/06/20  1321     History Chief Complaint  Patient presents with  . Covid Positive  . Fatigue  . Cough    Tammy Randolph is a 59 y.o. female.  The history is provided by the patient and medical records. No language interpreter was used.  Cough  Tammy Randolph is a 59 y.o. female who presents to the Emergency Department complaining of fatigue, COVID positive. She presents the emergency department complaining of ongoing fatigue and shortness of breath. She was diagnosed with COVID-19 infection on August 18. She has been battling persistent headaches, cough, shortness of breath, leg cramps. She is experiencing chest pressure when she lays down at night and is having difficulty sleeping. Symptoms have been waxing and waning but worsening over the last few days. This is her third bottle with COVID-19 infection. She has been vaccinated with [employer] vaccine. Her first episode of COVID-19 was January 2020. Her second infection was January 2021. She has a history of hypertension. She takes no hormone medications. She does not smoke. Symptoms are moderate to severe, constant, worsening.    Past Medical History:  Diagnosis Date  . Adenomatous colon polyp 1996  . Anxiety   . ANXIETY 07/15/2008   Qualifier: Diagnosis of  By: Ronnald Ramp CMA, Chemira    . Arthritis   . Asthma   . Bipolar disorder ([place])   . Chronic fatigue   . Depression   . History of blood transfusion 2003  . Hypertension   . Pneumonia    couple of times last being last year  . PONV (postoperative nausea and vomiting)   . Seasonal allergies   . Spinal headache     Patient Active Problem List   Diagnosis Date Noted  . Generalized anxiety disorder 02/18/2019  . Neck pain 09/23/2015  . Right leg swelling 05/12/2015  . Attention deficit hyperactivity disorder (ADHD), inattentive type, moderate 05/23/2014  .  Bipolar II disorder, mild, depressed, with anxious distress, in partial remission ([place]) 05/23/2014  . LYMPHOCYTOSIS 01/17/2011  . TACHYCARDIA, HX OF 01/12/2011  . SNORING, HX OF 01/12/2011  . Essential hypertension 09/01/2010  . RHINITIS 09/01/2010  . ASTHMA 09/01/2010  . WEIGHT GAIN 06/07/2010  . COLONIC POLYPS 07/22/2008  . UNSPECIFIED NONTOXIC NODULAR GOITER 07/22/2008  . 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 LOW BACK PAIN SYNDROME 09/11/2007  . Other malaise and fatigue 09/11/2007    Past Surgical History:  Procedure Laterality Date  . ABDOMINAL HYSTERECTOMY  2004   and USO for endometriosis  . ANTERIOR CERVICAL DECOMP/DISCECTOMY FUSION N/A 09/23/2015   Procedure: ANTERIOR CERVICAL DISCECTOMY FUSION C4-7     (3 LEVELS);  Surgeon: Melina Schools, MD;  Location: [facility];  Service: Orthopedics;  Laterality: N/A;  . APPENDECTOMY  1988  . BREAST ENHANCEMENT SURGERY Bilateral 2008  . COLONOSCOPY W/ POLYPECTOMY  2013   Dr Fuller Plan  . HYSTERECTOMY ABDOMINAL WITH SALPINGECTOMY     x4 for endometriosis  . KNEE ARTHROSCOPY WITH ANTERIOR CRUCIATE LIGAMENT (ACL) REPAIR Right 01/26/2013  . LUMBAR LAMINECTOMY  2008  . TONSILLECTOMY  2009     OB History   No obstetric history on file.     Family History  Problem Relation Age of Onset  . Colon cancer Brother 66  . Colon polyps Brother   . Stomach cancer Maternal Uncle 38  . Colon cancer Paternal Uncle 102  .  Colon cancer Maternal Grandmother 44  . Sleep apnea Father   . CVA Father   . Sleep apnea Sister   . Hypertension Sister   . Coronary artery disease Paternal Aunt   . Pancreatic cancer Paternal Uncle   . CVA Mother        x3  . Hypertension Mother   . Arthritis Mother   . Breast cancer Mother   . Heart Problems Mother        pacemaker  . Heart attack Maternal Uncle   . Clotting disorder Brother   . Hypertension Sister     Social History   Tobacco Use  . Smoking status: Former Smoker    Quit  date: 12/26/1986    Years since quitting: 33.7  . Smokeless tobacco: Never Used  . Tobacco comment: up to 1& 1/2packs /week  Vaping Use  . Vaping Use: Never used  Substance Use Topics  . Alcohol use: Yes    Alcohol/week: 0.0 standard drinks    Comment: socially  . Drug use: No    Home Medications Prior to Admission medications   Medication Sig Start Date End Date Taking? Authorizing Provider  albuterol (PROVENTIL HFA;VENTOLIN HFA) 108 (90 BASE) MCG/ACT inhaler Inhale 1-2 puffs into the lungs every 6 (six) hours as needed for wheezing or shortness of breath.    [provider]  ALPRAZolam Duanne Moron) 1 MG tablet TAKE 1 TABLET(1 MG) BY MOUTH FOUR TIMES DAILY AS NEEDED FOR ANXIETY 07/22/20   Delight Hoh, MD  amphetamine-dextroamphetamine (ADDERALL XR) 20 MG 24 hr capsule Take 1 capsule (20 mg total) by mouth daily after breakfast. 08/08/20 09/07/20  Delight Hoh, MD  benzonatate (TESSALON) 100 MG capsule Take 1 capsule (100 mg total) by mouth 3 (three) times daily as needed for cough. 09/06/20   Quintella Reichert, MD  buPROPion (WELLBUTRIN XL) 150 MG 24 hr tablet Take 3 tablets (450 mg total) by mouth every morning. 07/22/20   Delight Hoh, MD  carvedilol (COREG) 12.5 MG tablet TAKE 1 AND 1/2 TABLETS BY MOUTH TWICE DAILY WITH A MEAL 08/11/20   Evans Lance, MD  meloxicam (MOBIC) 15 MG tablet Take 15 mg by mouth daily. 06/19/20   [provider]  ondansetron (ZOFRAN) 4 MG tablet Take 1 tablet (4 mg total) by mouth every 8 (eight) hours as needed for nausea or vomiting. 09/23/15   Melina Schools, MD  oxyCODONE-acetaminophen (PERCOCET) 10-325 MG tablet Take 1 tablet by mouth every 4 (four) hours as needed for pain. 09/23/15   Melina Schools, MD  pseudoephedrine-acetaminophen (TYLENOL SINUS) 30-500 MG TABS Take 1 tablet by mouth every 4 (four) hours as needed (Seasonal Allergies).    [provider]  spironolactone (ALDACTONE) 25 MG tablet TAKE 1 TABLET BY MOUTH DAILY  08/11/20   Evans Lance, MD  SUMAtriptan (IMITREX) 50 MG tablet as directed. TAKE 1 TABLET BY MOUTH AS NEEDED    [provider]  zolpidem (AMBIEN) 10 MG tablet Take 1 pill total 10 mg by mouth every bedtime 07/22/20   Delight Hoh, MD    Allergies    Hydrocodone-acetaminophen and Morphine  Review of Systems   Review of Systems  Respiratory: Positive for cough.   All other systems reviewed and are negative.   Physical Exam Updated Vital Signs BP (!) 157/100   Pulse (!) 57   Temp 97.8 F (36.6 C) (Oral)   Resp 14   SpO2 99%   Physical Exam Vitals and nursing  note reviewed.  Constitutional:      Appearance: She is well-developed.  HENT:     Head: Normocephalic and atraumatic.  Cardiovascular:     Rate and Rhythm: Normal rate and regular rhythm.     Heart sounds: No murmur heard.   Pulmonary:     Effort: Pulmonary effort is normal. No respiratory distress.     Breath sounds: Normal breath sounds.  Abdominal:     Palpations: Abdomen is soft.     Tenderness: There is no abdominal tenderness. There is no guarding or rebound.  Musculoskeletal:        General: No swelling or tenderness.  Skin:    General: Skin is warm and dry.  Neurological:     Mental Status: She is alert and oriented to person, place, and time.  Psychiatric:        Behavior: Behavior normal.     ED Results / Procedures / Treatments   Labs (all labs ordered are listed, but only abnormal results are displayed) Labs Reviewed  COMPREHENSIVE METABOLIC PANEL - Abnormal; Notable for the following components:      Result Value   Chloride 97 (*)    All other components within normal limits  CBC WITH DIFFERENTIAL/PLATELET - Abnormal; Notable for the following components:   Hemoglobin 15.4 (*)    All other components within normal limits  D-DIMER, QUANTITATIVE (NOT AT [facility])  TROPONIN I (HIGH SENSITIVITY)  TROPONIN I (HIGH SENSITIVITY)    EKG EKG Interpretation  Date/Time:  Sunday  September 06 2020 19:37:51 EDT Ventricular Rate:  68 PR Interval:    QRS Duration: 81 QT Interval:  406 QTC Calculation: 432 R Axis:   54 Text Interpretation: Sinus rhythm Confirmed by Arrayah Connors ([id number]) on 09/06/2020 7:45:55 PM   Radiology No results found.  Procedures Procedures (including critical care time)  Medications Ordered in ED Medications - No data to display  ED Course  I have reviewed the triage vital signs and the nursing notes.  Pertinent labs & imaging results that were available during my care of the patient were reviewed by me and considered in my medical decision making (see chart for details).    MDM Rules/Calculators/A&P                         Patient with history of COVID-19 infection one month ago referred from urgent care for x-ray demonstrating pneumonia. She is complaining of chest pain, shortness of breath, cough and fatigue. On examination she was non-toxic appearing with no respiratory distress. Presentation is not consistent with PE, acute CHF, bacterial pneumonia, ACS. Discussed with patient home care for fatigue, cough post COVID. Discussed PCP follow-up and return precautions.  Final Clinical Impression(s) / ED Diagnoses Final diagnoses:  Fatigue, unspecified type  Cough    Rx / DC Orders ED Discharge Orders         Ordered    benzonatate (TESSALON) 100 MG capsule  3 times daily PRN        09 /12/21 2022           Quintella Reichert, MD 09/06/20 2317

## 2020-09-18 DIAGNOSIS — R002 Palpitations: Secondary | ICD-10-CM | POA: Diagnosis not present

## 2020-09-22 ENCOUNTER — Ambulatory Visit: Payer: BC Managed Care – PPO

## 2020-09-22 ENCOUNTER — Other Ambulatory Visit (HOSPITAL_COMMUNITY): Payer: BC Managed Care – PPO

## 2020-09-22 DIAGNOSIS — Z01419 Encounter for gynecological examination (general) (routine) without abnormal findings: Secondary | ICD-10-CM | POA: Diagnosis not present

## 2020-09-22 DIAGNOSIS — Z6826 Body mass index (BMI) 26.0-26.9, adult: Secondary | ICD-10-CM | POA: Diagnosis not present

## 2020-10-05 ENCOUNTER — Ambulatory Visit: Payer: BC Managed Care – PPO

## 2020-10-05 ENCOUNTER — Other Ambulatory Visit (HOSPITAL_COMMUNITY): Payer: BC Managed Care – PPO

## 2020-10-05 ENCOUNTER — Telehealth (HOSPITAL_COMMUNITY): Payer: Self-pay | Admitting: Physician Assistant

## 2020-10-05 NOTE — Telephone Encounter (Signed)
Patient cancelled echocardiogram due to exposure to COVID per scheduler and will call back to reschedule. I called patient to reschedule appointment. Order will be removed from the [facility] and when patient calls back to rescheduled we will reinstate the order. Thank you.

## 2020-10-05 NOTE — Progress Notes (Deleted)
Patient ID: Tammy Randolph                 DOB: [birth year]                      MRN: [id number]     HPI: Tammy Randolph is a 59 y.o. female referred by Vick Frees to HTN clinic. PMH is significant for HTN, bipolar II disorder, GAD, and ADHD.  Current HTN meds: carvedilol 12.5mg  BID, spironolactone 25mg  Previously tried:  BP goal:   Family History:   Social History:   Diet:   Exercise:   Home BP readings:   Wt Readings from Last 3 Encounters:  08/07/20 157 lb (71.2 kg)  01/10/18 155 lb (70.3 kg)  07/18/17 140 lb (63.5 kg)   BP Readings from Last 3 Encounters:  09/06/20 (!) 157/100  08/07/20 130/88  01/10/18 (!) 134/92   Pulse Readings from Last 3 Encounters:  09/06/20 (!) 57  08/07/20 82  01/10/18 84    Renal function: CrCl cannot be calculated (Patient's most recent lab result is older than the maximum 21 days allowed.).  Past Medical History:  Diagnosis Date  . Adenomatous colon polyp 1996  . Anxiety   . ANXIETY 07/15/2008   Qualifier: Diagnosis of  By: Ronnald Ramp CMA, Chemira    . Arthritis   . Asthma   . Bipolar disorder ([place])   . Chronic fatigue   . Depression   . History of blood transfusion 2003  . Hypertension   . Pneumonia    couple of times last being last year  . PONV (postoperative nausea and vomiting)   . Seasonal allergies   . Spinal headache     Current Outpatient Medications on File Prior to Visit  Medication Sig Dispense Refill  . albuterol (PROVENTIL HFA;VENTOLIN HFA) 108 (90 BASE) MCG/ACT inhaler Inhale 1-2 puffs into the lungs every 6 (six) hours as needed for wheezing or shortness of breath.    . ALPRAZolam (XANAX) 1 MG tablet TAKE 1 TABLET(1 MG) BY MOUTH FOUR TIMES DAILY AS NEEDED FOR ANXIETY 120 tablet 2  . amphetamine-dextroamphetamine (ADDERALL XR) 20 MG 24 hr capsule Take 1 capsule (20 mg total) by mouth daily after breakfast. 30 capsule 0  . benzonatate (TESSALON) 100 MG capsule Take 1 capsule (100 mg total) by mouth 3 (three)  times daily as needed for cough. 21 capsule 0  . buPROPion (WELLBUTRIN XL) 150 MG 24 hr tablet Take 3 tablets (450 mg total) by mouth every morning. 90 tablet 5  . carvedilol (COREG) 12.5 MG tablet TAKE 1 AND 1/2 TABLETS BY MOUTH TWICE DAILY WITH A MEAL 270 tablet 3  . meloxicam (MOBIC) 15 MG tablet Take 15 mg by mouth daily.    . ondansetron (ZOFRAN) 4 MG tablet Take 1 tablet (4 mg total) by mouth every 8 (eight) hours as needed for nausea or vomiting. 20 tablet 0  . oxyCODONE-acetaminophen (PERCOCET) 10-325 MG tablet Take 1 tablet by mouth every 4 (four) hours as needed for pain. 60 tablet 0  . pseudoephedrine-acetaminophen (TYLENOL SINUS) 30-500 MG TABS Take 1 tablet by mouth every 4 (four) hours as needed (Seasonal Allergies).    Marland Kitchen spironolactone (ALDACTONE) 25 MG tablet TAKE 1 TABLET BY MOUTH DAILY 90 tablet 3  . SUMAtriptan (IMITREX) 50 MG tablet as directed. TAKE 1 TABLET BY MOUTH AS NEEDED    . zolpidem (AMBIEN) 10 MG tablet Take 1 pill total 10 mg by mouth every bedtime 30  tablet 4   No current facility-administered medications on file prior to visit.    Allergies  Allergen Reactions  . Hydrocodone-Acetaminophen Itching and Rash  . Morphine Other (See Comments)    Pt did not like the way it made her feel     Assessment/Plan:  1. Hypertension -

## 2020-10-08 ENCOUNTER — Other Ambulatory Visit: Payer: Self-pay | Admitting: Obstetrics and Gynecology

## 2020-10-08 DIAGNOSIS — E041 Nontoxic single thyroid nodule: Secondary | ICD-10-CM

## 2020-10-15 ENCOUNTER — Encounter: Payer: Self-pay | Admitting: Psychiatry

## 2020-10-22 ENCOUNTER — Ambulatory Visit
Admission: RE | Admit: 2020-10-22 | Discharge: 2020-10-22 | Disposition: A | Discharge status: Home / Self Care | Payer: BC Managed Care – PPO | Source: Ambulatory Visit | Attending: Obstetrics and Gynecology | Admitting: Obstetrics and Gynecology

## 2020-10-22 DIAGNOSIS — E041 Nontoxic single thyroid nodule: Secondary | ICD-10-CM

## 2020-10-23 DIAGNOSIS — Z1231 Encounter for screening mammogram for malignant neoplasm of breast: Secondary | ICD-10-CM | POA: Diagnosis not present

## 2020-10-23 LAB — HM MAMMOGRAPHY

## 2020-10-27 ENCOUNTER — Encounter: Payer: Self-pay | Admitting: *Deleted

## 2020-12-12 DIAGNOSIS — N390 Urinary tract infection, site not specified: Secondary | ICD-10-CM | POA: Diagnosis not present

## 2020-12-16 DIAGNOSIS — J101 Influenza due to other identified influenza virus with other respiratory manifestations: Secondary | ICD-10-CM | POA: Diagnosis not present

## 2020-12-17 DIAGNOSIS — N3000 Acute cystitis without hematuria: Secondary | ICD-10-CM | POA: Diagnosis not present

## 2020-12-26 DIAGNOSIS — B373 Candidiasis of vulva and vagina: Secondary | ICD-10-CM | POA: Diagnosis not present

## 2021-01-08 DIAGNOSIS — N3010 Interstitial cystitis (chronic) without hematuria: Secondary | ICD-10-CM | POA: Diagnosis not present

## 2021-01-08 DIAGNOSIS — N952 Postmenopausal atrophic vaginitis: Secondary | ICD-10-CM | POA: Diagnosis not present

## 2021-01-08 DIAGNOSIS — N390 Urinary tract infection, site not specified: Secondary | ICD-10-CM | POA: Diagnosis not present

## 2021-01-08 DIAGNOSIS — R350 Frequency of micturition: Secondary | ICD-10-CM | POA: Diagnosis not present

## 2021-01-13 ENCOUNTER — Other Ambulatory Visit: Payer: Self-pay | Admitting: Psychiatry

## 2021-01-13 DIAGNOSIS — F3181 Bipolar II disorder: Secondary | ICD-10-CM

## 2021-01-13 DIAGNOSIS — F411 Generalized anxiety disorder: Secondary | ICD-10-CM

## 2021-01-13 NOTE — Telephone Encounter (Signed)
[place] patient Scheduled with you on 02/05/21

## 2021-01-15 DIAGNOSIS — M7542 Impingement syndrome of left shoulder: Secondary | ICD-10-CM | POA: Diagnosis not present

## 2021-01-21 ENCOUNTER — Other Ambulatory Visit: Payer: Self-pay

## 2021-01-21 ENCOUNTER — Ambulatory Visit (HOSPITAL_COMMUNITY): Payer: BC Managed Care – PPO | Attending: Internal Medicine

## 2021-01-21 DIAGNOSIS — R002 Palpitations: Secondary | ICD-10-CM | POA: Diagnosis not present

## 2021-01-21 LAB — ECHOCARDIOGRAM COMPLETE
Area-P 1/2: 3.72 cm2
S' Lateral: 2.4 cm

## 2021-01-27 ENCOUNTER — Other Ambulatory Visit: Payer: Self-pay

## 2021-01-27 ENCOUNTER — Ambulatory Visit: Payer: BC Managed Care – PPO | Admitting: Internal Medicine

## 2021-01-27 ENCOUNTER — Encounter: Payer: Self-pay | Admitting: Internal Medicine

## 2021-01-27 VITALS — BP 148/90 | HR 81 | Ht 65.0 in | Wt 160.0 lb

## 2021-01-27 DIAGNOSIS — I10 Essential (primary) hypertension: Secondary | ICD-10-CM

## 2021-01-27 DIAGNOSIS — R002 Palpitations: Secondary | ICD-10-CM | POA: Diagnosis not present

## 2021-01-27 MED ORDER — CARVEDILOL 12.5 MG PO TABS: 18.7500 mg | ORAL_TABLET | Freq: Two times a day (BID) | ORAL | 3 refills | Status: DC

## 2021-01-27 MED ORDER — SPIRONOLACTONE 25 MG PO TABS: ORAL_TABLET | ORAL | 3 refills | Status: DC

## 2021-01-27 NOTE — Patient Instructions (Signed)

## 2021-01-27 NOTE — Progress Notes (Addendum)
HPI [name] returns today for followup. She is a pleasant 60 yo woman with a h/o HTN, atypical chest pain, and palpitations. She is pending shoulder surgery. She denies any exercise symptoms. She works out with a Clinical research associate regularly and has no limit except for arthritis. No syncope. Her palpitations have been controlled.   Allergies  Allergen Reactions  . Hydrocodone-Acetaminophen Itching and Rash  . Morphine Other (See Comments)    Pt did not like the way it made her feel     Current Outpatient Medications  Medication Sig Dispense Refill  . albuterol (PROVENTIL HFA;VENTOLIN HFA) 108 (90 BASE) MCG/ACT inhaler Inhale 1-2 puffs into the lungs every 6 (six) hours as needed for wheezing or shortness of breath.    . ALPRAZolam (XANAX) 1 MG tablet TAKE 1 TABLET(1 MG) BY MOUTH FOUR TIMES DAILY AS NEEDED FOR ANXIETY 120 tablet 0  . buPROPion (WELLBUTRIN XL) 150 MG 24 hr tablet Take 3 tablets (450 mg total) by mouth every morning. 90 tablet 5  . ondansetron (ZOFRAN) 4 MG tablet Take 1 tablet (4 mg total) by mouth every 8 (eight) hours as needed for nausea or vomiting. 20 tablet 0  . oxyCODONE-acetaminophen (PERCOCET) 10-325 MG tablet Take 1 tablet by mouth every 4 (four) hours as needed for pain. 60 tablet 0  . pseudoephedrine-acetaminophen (TYLENOL SINUS) 30-500 MG TABS Take 1 tablet by mouth every 4 (four) hours as needed (Seasonal Allergies).    . SUMAtriptan (IMITREX) 50 MG tablet as directed. TAKE 1 TABLET BY MOUTH AS NEEDED    . zolpidem (AMBIEN) 10 MG tablet Take 1 pill total 10 mg by mouth every bedtime 30 tablet 4  . amphetamine-dextroamphetamine (ADDERALL XR) 20 MG 24 hr capsule Take 1 capsule (20 mg total) by mouth daily after breakfast. 30 capsule 0  . carvedilol (COREG) 12.5 MG tablet Take 1.5 tablets (18.75 mg total) by mouth 2 (two) times daily with a meal. 270 tablet 3  . spironolactone (ALDACTONE) 25 MG tablet TAKE 1 TABLET BY MOUTH DAILY 90 tablet 3   No current  facility-administered medications for this visit.     Past Medical History:  Diagnosis Date  . Adenomatous colon polyp 1996  . Anxiety   . ANXIETY 07/15/2008   Qualifier: Diagnosis of  By: Ronnald Ramp CMA, Chemira    . Arthritis   . Asthma   . Bipolar disorder ([place])   . Chronic fatigue   . Depression   . History of blood transfusion 2003  . Hypertension   . Pneumonia    couple of times last being last year  . PONV (postoperative nausea and vomiting)   . Seasonal allergies   . Spinal headache     ROS:   All systems reviewed and negative except as noted in the HPI.   Past Surgical History:  Procedure Laterality Date  . ABDOMINAL HYSTERECTOMY  2004   and USO for endometriosis  . ANTERIOR CERVICAL DECOMP/DISCECTOMY FUSION N/A 09/23/2015   Procedure: ANTERIOR CERVICAL DISCECTOMY FUSION C4-7     (3 LEVELS);  Surgeon: Melina Schools, MD;  Location: [facility];  Service: Orthopedics;  Laterality: N/A;  . APPENDECTOMY  1988  . BREAST ENHANCEMENT SURGERY Bilateral 2008  . COLONOSCOPY W/ POLYPECTOMY  2013   Dr Fuller Plan  . HYSTERECTOMY ABDOMINAL WITH SALPINGECTOMY     x4 for endometriosis  . KNEE ARTHROSCOPY WITH ANTERIOR CRUCIATE LIGAMENT (ACL) REPAIR Right 01/26/2013  . LUMBAR LAMINECTOMY  2008  . TONSILLECTOMY  2009  Family History  Problem Relation Age of Onset  . Colon cancer Brother 39  . Colon polyps Brother   . Stomach cancer Maternal Uncle 78  . Colon cancer Paternal Uncle 57  . Colon cancer Maternal Grandmother 24  . Sleep apnea Father   . CVA Father   . Sleep apnea Sister   . Hypertension Sister   . Coronary artery disease Paternal Aunt   . Pancreatic cancer Paternal Uncle   . CVA Mother        x3  . Hypertension Mother   . Arthritis Mother   . Breast cancer Mother   . Heart Problems Mother        pacemaker  . Heart attack Maternal Uncle   . Clotting disorder Brother   . Hypertension Sister      Social History   Socioeconomic History  . Marital status:  Married    Spouse name: Not on file  . Number of children: Not on file  . Years of education: Not on file  . Highest education level: Not on file  Occupational History  . Not on file  Tobacco Use  . Smoking status: Former Smoker    Quit date: 12/26/1986    Years since quitting: 34.1  . Smokeless tobacco: Never Used  . Tobacco comment: up to 1& 1/2packs /week  Vaping Use  . Vaping Use: Never used  Substance and Sexual Activity  . Alcohol use: Yes    Alcohol/week: 0.0 standard drinks    Comment: socially  . Drug use: No  . Sexual activity: Not on file  Other Topics Concern  . Not on file  Social History Narrative  . Not on file   Social Determinants of Health   Financial Resource Strain: Not on file  Food Insecurity: Not on file  Transportation Needs: Not on file  Physical Activity: Not on file  Stress: Not on file  Social Connections: Not on file  Intimate Partner Violence: Not on file     BP (!) 148/90   Pulse 81   Ht 5\' 5"  (1.651 m)   Wt 160 lb (72.6 kg)   SpO2 95%   BMI 26.63 kg/m   Physical Exam:  Well appearing NAD HEENT: Unremarkable Neck:  No JVD, no thyromegally Lymphatics:  No adenopathy Back:  No CVA tenderness Lungs:  Clear HEART:  Regular rate rhythm, no murmurs, no rubs, no clicks Abd:  soft, positive bowel sounds, no organomegally, no rebound, no guarding Ext:  2 plus pulses, no edema, no cyanosis, no clubbing Skin:  No rashes no nodules Neuro:  CN II through XII intact, motor grossly intact  EKG - NSR   Assess/Plan: 1. Preoperative eval - she is pending shoulder surgery. She is quiet active and a low risk for periperative cardiac complications. She may proceed with no additional testing. 2. HTN - her bp remains elevated. She is encouraged to avoid caffeine and maintain a low sodium diet. 3. Chest pain - her symptoms are very mild and non-cardiac. No additional evaluation. During vigorous exercise, she has no chest pain or  pressure.  [name],MD

## 2021-01-29 DIAGNOSIS — R350 Frequency of micturition: Secondary | ICD-10-CM | POA: Diagnosis not present

## 2021-01-29 DIAGNOSIS — N390 Urinary tract infection, site not specified: Secondary | ICD-10-CM | POA: Diagnosis not present

## 2021-02-03 ENCOUNTER — Other Ambulatory Visit: Payer: Self-pay | Admitting: Psychiatry

## 2021-02-03 DIAGNOSIS — F411 Generalized anxiety disorder: Secondary | ICD-10-CM

## 2021-02-03 DIAGNOSIS — F3181 Bipolar II disorder: Secondary | ICD-10-CM

## 2021-02-04 ENCOUNTER — Other Ambulatory Visit: Payer: Self-pay | Admitting: Adult Health

## 2021-02-04 DIAGNOSIS — F411 Generalized anxiety disorder: Secondary | ICD-10-CM

## 2021-02-04 DIAGNOSIS — F900 Attention-deficit hyperactivity disorder, predominantly inattentive type: Secondary | ICD-10-CM

## 2021-02-04 DIAGNOSIS — F3181 Bipolar II disorder: Secondary | ICD-10-CM

## 2021-02-04 MED ORDER — ZOLPIDEM TARTRATE 10 MG PO TABS: ORAL_TABLET | ORAL | 0 refills | Status: DC

## 2021-02-04 MED ORDER — AMPHETAMINE-DEXTROAMPHET ER 20 MG PO CP24: 20.0000 mg | ORAL_CAPSULE | Freq: Every day | ORAL | 0 refills | Status: DC

## 2021-02-04 MED ORDER — BUPROPION HCL ER (XL) 150 MG PO TB24: 450.0000 mg | ORAL_TABLET | Freq: Every morning | ORAL | 5 refills | Status: DC

## 2021-02-04 NOTE — Telephone Encounter (Signed)
Pt needs her adderall xr 20 mg and her wellbutrin 450 mg sent to the [facility] on [address]. She is a past pt of [facility] and has appt 02/23/21 with you

## 2021-02-04 NOTE — Telephone Encounter (Signed)
Scripts sent

## 2021-02-05 ENCOUNTER — Ambulatory Visit: Payer: BC Managed Care – PPO | Admitting: Adult Health

## 2021-02-09 DIAGNOSIS — I890 Lymphedema, not elsewhere classified: Secondary | ICD-10-CM | POA: Diagnosis not present

## 2021-02-09 DIAGNOSIS — Z4889 Encounter for other specified surgical aftercare: Secondary | ICD-10-CM | POA: Diagnosis not present

## 2021-02-09 DIAGNOSIS — M75122 Complete rotator cuff tear or rupture of left shoulder, not specified as traumatic: Secondary | ICD-10-CM | POA: Diagnosis not present

## 2021-02-09 DIAGNOSIS — M75112 Incomplete rotator cuff tear or rupture of left shoulder, not specified as traumatic: Secondary | ICD-10-CM | POA: Diagnosis not present

## 2021-02-09 DIAGNOSIS — M19012 Primary osteoarthritis, left shoulder: Secondary | ICD-10-CM | POA: Diagnosis not present

## 2021-02-09 DIAGNOSIS — G8918 Other acute postprocedural pain: Secondary | ICD-10-CM | POA: Diagnosis not present

## 2021-02-09 DIAGNOSIS — M25512 Pain in left shoulder: Secondary | ICD-10-CM | POA: Diagnosis not present

## 2021-02-09 DIAGNOSIS — M7522 Bicipital tendinitis, left shoulder: Secondary | ICD-10-CM | POA: Diagnosis not present

## 2021-02-09 DIAGNOSIS — S43432A Superior glenoid labrum lesion of left shoulder, initial encounter: Secondary | ICD-10-CM | POA: Diagnosis not present

## 2021-02-09 DIAGNOSIS — M7542 Impingement syndrome of left shoulder: Secondary | ICD-10-CM | POA: Diagnosis not present

## 2021-02-17 DIAGNOSIS — M25512 Pain in left shoulder: Secondary | ICD-10-CM | POA: Diagnosis not present

## 2021-02-17 DIAGNOSIS — M25612 Stiffness of left shoulder, not elsewhere classified: Secondary | ICD-10-CM | POA: Diagnosis not present

## 2021-02-23 ENCOUNTER — Encounter: Payer: Self-pay | Admitting: Adult Health

## 2021-02-23 ENCOUNTER — Ambulatory Visit (INDEPENDENT_AMBULATORY_CARE_PROVIDER_SITE_OTHER): Payer: BC Managed Care – PPO | Admitting: Adult Health

## 2021-02-23 ENCOUNTER — Other Ambulatory Visit: Payer: Self-pay

## 2021-02-23 DIAGNOSIS — F3181 Bipolar II disorder: Secondary | ICD-10-CM | POA: Diagnosis not present

## 2021-02-23 DIAGNOSIS — F900 Attention-deficit hyperactivity disorder, predominantly inattentive type: Secondary | ICD-10-CM | POA: Diagnosis not present

## 2021-02-23 DIAGNOSIS — G4700 Insomnia, unspecified: Secondary | ICD-10-CM

## 2021-02-23 DIAGNOSIS — F411 Generalized anxiety disorder: Secondary | ICD-10-CM

## 2021-02-23 MED ORDER — ALPRAZOLAM 1 MG PO TABS: ORAL_TABLET | ORAL | 2 refills | Status: DC

## 2021-02-23 MED ORDER — ZOLPIDEM TARTRATE 10 MG PO TABS: ORAL_TABLET | ORAL | 2 refills | Status: DC

## 2021-02-23 MED ORDER — LAMOTRIGINE 25 MG PO TABS: ORAL_TABLET | ORAL | 2 refills | Status: DC

## 2021-02-23 NOTE — Progress Notes (Signed)
THERMA LASURE [id number] [birth year] 60 y.o.  Subjective:   Patient ID:  Tammy Randolph is a 60 y.o. (DOB 1961/05/16) female.  Chief Complaint: No chief complaint on file.   HPI Tammy Randolph presents to the office today for follow-up of GAD, insomnia, BPD-2, and ADHD.  Describes mood today as "ok". Pleasant. Tearful at times. Mood symptoms - reports depression and irritability. Denies anxiety. More irritable at herself. Periods of "down times" over past year. Self conscious about weight. Finding excuses not to go out with friends.  Decreased interest and motivation to do things. Recent surgery for torn rotator cuff. 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 Taking medications as prescribed.  Energy levels lower. Active, does not have a regular exercise routine.   Enjoys some usual interests and activities. Married. Lives with husband. Has a 31 year old son - lives local. Spending time with family. Appetite adequate - eating healthy and cutting carbs. Concerned about weight gain. Sleeping difficulties - getting to sleep after a few hours, then sleeps 4 hours.    Focus and concentration difficulties at times. Taking Adderall daily. Completing tasks. Managing aspects of household.  Denies SI or HI.  Denies AH or VH.  Previous medication trials: Celexa, Lexapro, Effexor in addition to Viibryd, Wellbutrin, Lamictal last year, tCymbalta 30 mg  Review of Systems:  Review of Systems  Musculoskeletal: Negative for gait problem.  Neurological: Negative for tremors.  Psychiatric/Behavioral:       Please refer to HPI    Medications: I have reviewed the patient's current medications.  Current Outpatient Medications  Medication Sig Dispense Refill  . lamoTRIgine (LAMICTAL) 25 MG tablet Take one tablet at bedtime for 14 days, then take two tablets at bedtime. 60 tablet 2  . albuterol (PROVENTIL HFA;VENTOLIN HFA) 108 (90 BASE) MCG/ACT inhaler Inhale 1-2 puffs into the lungs every 6  (six) hours as needed for wheezing or shortness of breath.    . ALPRAZolam (XANAX) 1 MG tablet Take one tablet four times daily. 120 tablet 2  . amphetamine-dextroamphetamine (ADDERALL XR) 20 MG 24 hr capsule Take 1 capsule (20 mg total) by mouth daily after breakfast. 30 capsule 0  . buPROPion (WELLBUTRIN XL) 150 MG 24 hr tablet Take 3 tablets (450 mg total) by mouth every morning. 90 tablet 5  . carvedilol (COREG) 12.5 MG tablet Take 1.5 tablets (18.75 mg total) by mouth 2 (two) times daily with a meal. 270 tablet 3  . ondansetron (ZOFRAN) 4 MG tablet Take 1 tablet (4 mg total) by mouth every 8 (eight) hours as needed for nausea or vomiting. 20 tablet 0  . oxyCODONE-acetaminophen (PERCOCET) 10-325 MG tablet Take 1 tablet by mouth every 4 (four) hours as needed for pain. 60 tablet 0  . pseudoephedrine-acetaminophen (TYLENOL SINUS) 30-500 MG TABS Take 1 tablet by mouth every 4 (four) hours as needed (Seasonal Allergies).    Marland Kitchen spironolactone (ALDACTONE) 25 MG tablet TAKE 1 TABLET BY MOUTH DAILY 90 tablet 3  . SUMAtriptan (IMITREX) 50 MG tablet as directed. TAKE 1 TABLET BY MOUTH AS NEEDED    . zolpidem (AMBIEN) 10 MG tablet Take one tablet by mouth every night at bedtime 30 tablet 2   No current facility-administered medications for this visit.    Medication Side Effects: None  Allergies:  Allergies  Allergen Reactions  . Hydrocodone-Acetaminophen Itching and Rash  . Morphine Other (See Comments)    Pt did not like the way it made her feel  Past Medical History:  Diagnosis Date  . Adenomatous colon polyp 1996  . Anxiety   . ANXIETY 07/15/2008   Qualifier: Diagnosis of  By: Ronnald Ramp CMA, Chemira    . Arthritis   . Asthma   . Bipolar disorder ([place])   . Chronic fatigue   . Depression   . History of blood transfusion 2003  . Hypertension   . Pneumonia    couple of times last being last year  . PONV (postoperative nausea and vomiting)   . Seasonal allergies   . Spinal headache      Family History  Problem Relation Age of Onset  . Colon cancer Brother 9  . Colon polyps Brother   . Stomach cancer Maternal Uncle 63  . Colon cancer Paternal Uncle 5  . Colon cancer Maternal Grandmother 52  . Sleep apnea Father   . CVA Father   . Sleep apnea Sister   . Hypertension Sister   . Coronary artery disease Paternal Aunt   . Pancreatic cancer Paternal Uncle   . CVA Mother        x3  . Hypertension Mother   . Arthritis Mother   . Breast cancer Mother   . Heart Problems Mother        pacemaker  . Heart attack Maternal Uncle   . Clotting disorder Brother   . Hypertension Sister     Social History   Socioeconomic History  . Marital status: Married    Spouse name: Not on file  . Number of children: Not on file  . Years of education: Not on file  . Highest education level: Not on file  Occupational History  . Not on file  Tobacco Use  . Smoking status: Former Smoker    Quit date: 12/26/1986    Years since quitting: 34.1  . Smokeless tobacco: Never Used  . Tobacco comment: up to 1& 1/2packs /week  Vaping Use  . Vaping Use: Never used  Substance and Sexual Activity  . Alcohol use: Yes    Alcohol/week: 0.0 standard drinks    Comment: socially  . Drug use: No  . Sexual activity: Not on file  Other Topics Concern  . Not on file  Social History Narrative  . Not on file   Social Determinants of Health   Financial Resource Strain: Not on file  Food Insecurity: Not on file  Transportation Needs: Not on file  Physical Activity: Not on file  Stress: Not on file  Social Connections: Not on file  Intimate Partner Violence: Not on file    Past Medical History, Surgical history, Social history, and Family history were reviewed and updated as appropriate.   Please see review of systems for further details on the patient's review from today.   Objective:   Physical Exam:  There were no vitals taken for this visit.  Physical Exam Constitutional:       General: She is not in acute distress. Musculoskeletal:        General: No deformity.  Neurological:     Mental Status: She is alert and oriented to person, place, and time.     Coordination: Coordination normal.  Psychiatric:        Attention and Perception: Attention and perception normal. She does not perceive auditory or visual hallucinations.        Mood and Affect: Mood normal. Mood is not anxious or depressed. Affect is not labile, blunt, angry or inappropriate.  Speech: Speech normal.        Behavior: Behavior normal.        Thought Content: Thought content normal. Thought content is not paranoid or delusional. Thought content does not include homicidal or suicidal ideation. Thought content does not include homicidal or suicidal plan.        Cognition and Memory: Cognition and memory normal.        Judgment: Judgment normal.     Comments: Insight intact     Lab Review:     Component Value Date/Time   NA 137 09/06/2020 1858   NA 138 01/17/2018 0846   K 4.0 09/06/2020 1858   CL 97 (L) 09/06/2020 1858   CO2 29 09/06/2020 1858   GLUCOSE 94 09/06/2020 1858   BUN 15 09/06/2020 1858   BUN 11 01/17/2018 0846   CREATININE 0.85 09/06/2020 1858   CALCIUM 9.8 09/06/2020 1858   PROT 8.1 09/06/2020 1858   ALBUMIN 4.7 09/06/2020 1858   AST 23 09/06/2020 1858   ALT 19 09/06/2020 1858   ALKPHOS 92 09/06/2020 1858   BILITOT 0.4 09/06/2020 1858   GFRNONAA >60 09/06/2020 1858   GFRAA >60 09/06/2020 1858       Component Value Date/Time   WBC 6.8 09/06/2020 1858   RBC 5.11 09/06/2020 1858   HGB 15.4 (H) 09/06/2020 1858   HGB 13.7 02/02/2011 1448   HCT 46.0 09/06/2020 1858   HCT 39.3 02/02/2011 1448   PLT 262 09/06/2020 1858   PLT 264 02/02/2011 1448   MCV 90.0 09/06/2020 1858   MCV 89.3 02/02/2011 1448   MCH 30.1 09/06/2020 1858   MCHC 33.5 09/06/2020 1858   RDW 12.2 09/06/2020 1858   RDW 12.3 02/02/2011 1448   LYMPHSABS 2.4 09/06/2020 1858   LYMPHSABS 2.0 02/02/2011  1448   MONOABS 0.7 09/06/2020 1858   MONOABS 0.5 02/02/2011 1448   EOSABS 0.4 09/06/2020 1858   EOSABS 0.2 02/02/2011 1448   BASOSABS 0.1 09/06/2020 1858   BASOSABS 0.0 02/02/2011 1448    No results found for: POCLITH, LITHIUM   No results found for: PHENYTOIN, PHENOBARB, VALPROATE, CBMZ   .res Assessment: Plan:    Plan:  PDMP reviewed  1. Wellbutrin 150 mg XL taking 3 tablets total 450 mg every morning 2. Adderall 20 mg XR daily in the morning - not taking every day. 3. Ambien 10mg  at hs 4. Xanax 1mg  - 4 x daily 5. Restart Lamictal 25mg  - take one tablet at bedtime for 14 days, then take two tablets at bedtime.   RTC 4 weeks  Patient advised to contact office with any questions, adverse effects, or acute worsening in signs and symptoms.  Discussed potential benefits, risk, and side effects of benzodiazepines to include potential risk of tolerance and dependence, as well as possible drowsiness.  Advised patient not to drive if experiencing drowsiness and to take lowest possible effective dose to minimize risk of dependence and tolerance.  Discussed potential benefits, risks, and side effects of stimulants with patient to include increased heart rate, palpitations, insomnia, increased anxiety, increased irritability, or decreased appetite.  Instructed patient to contact office if experiencing any significant tolerability issues.  Counseled patient regarding potential benefits, risks, and side effects of Lamictal to include potential risk of Stevens-Johnson syndrome. Advised patient to stop taking Lamictal and contact office immediately if rash develops and to seek urgent medical attention if rash is severe and/or spreading quickly. Will start Lamictal 25 mg daily for 2 weeks, then increase to  50 mg daily for 2 weeks, then 100 mg daily for 2 weeks, then 150 mg daily for mood symptoms.    Diagnoses and all orders for this visit:  Bipolar II disorder, mild, depressed, with anxious  distress, in partial remission (HCC) -     ALPRAZolam (XANAX) 1 MG tablet; Take one tablet four times daily. -     Discontinue: zolpidem (AMBIEN) 10 MG tablet; Take one tablet by mouth every night at bedtime -     lamoTRIgine (LAMICTAL) 25 MG tablet; Take one tablet at bedtime for 14 days, then take two tablets at bedtime. -     zolpidem (AMBIEN) 10 MG tablet; Take one tablet by mouth every night at bedtime  Generalized anxiety disorder -     ALPRAZolam (XANAX) 1 MG tablet; Take one tablet four times daily. -     Discontinue: zolpidem (AMBIEN) 10 MG tablet; Take one tablet by mouth every night at bedtime -     zolpidem (AMBIEN) 10 MG tablet; Take one tablet by mouth every night at bedtime  Attention deficit hyperactivity disorder (ADHD), inattentive type, moderate  Insomnia, unspecified type -     Discontinue: zolpidem (AMBIEN) 10 MG tablet; Take one tablet by mouth every night at bedtime -     zolpidem (AMBIEN) 10 MG tablet; Take one tablet by mouth every night at bedtime     Please see After Visit Summary for patient specific instructions.  No future appointments.  No orders of the defined types were placed in this encounter.   -------------------------------

## 2021-03-10 DIAGNOSIS — M25612 Stiffness of left shoulder, not elsewhere classified: Secondary | ICD-10-CM | POA: Diagnosis not present

## 2021-03-10 DIAGNOSIS — M25512 Pain in left shoulder: Secondary | ICD-10-CM | POA: Diagnosis not present

## 2021-03-17 DIAGNOSIS — M25512 Pain in left shoulder: Secondary | ICD-10-CM | POA: Diagnosis not present

## 2021-03-17 DIAGNOSIS — M25612 Stiffness of left shoulder, not elsewhere classified: Secondary | ICD-10-CM | POA: Diagnosis not present

## 2021-03-23 ENCOUNTER — Telehealth (INDEPENDENT_AMBULATORY_CARE_PROVIDER_SITE_OTHER): Payer: BC Managed Care – PPO | Admitting: Adult Health

## 2021-03-23 ENCOUNTER — Encounter: Payer: Self-pay | Admitting: Adult Health

## 2021-03-23 DIAGNOSIS — G4700 Insomnia, unspecified: Secondary | ICD-10-CM

## 2021-03-23 DIAGNOSIS — F3181 Bipolar II disorder: Secondary | ICD-10-CM | POA: Diagnosis not present

## 2021-03-23 DIAGNOSIS — F900 Attention-deficit hyperactivity disorder, predominantly inattentive type: Secondary | ICD-10-CM | POA: Diagnosis not present

## 2021-03-23 DIAGNOSIS — F411 Generalized anxiety disorder: Secondary | ICD-10-CM | POA: Diagnosis not present

## 2021-03-23 IMAGING — US US THYROID
1 series · 13 of 25 positions shown · non-contrast
Comparison: 03/23/2017

CLINICAL DATA: Prior ultrasound follow-up.

EXAM:
THYROID ULTRASOUND
TECHNIQUE: Ultrasound examination of the thyroid gland and adjacent soft
tissues was performed.

[Series 1: us thyroid · 0.05mm/px · 13 of 45 slices shown]
[im 1/45]
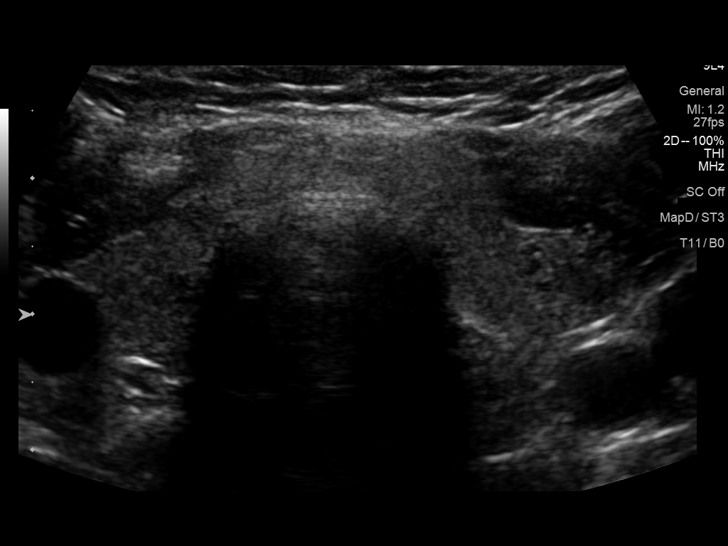
[im 4/45]
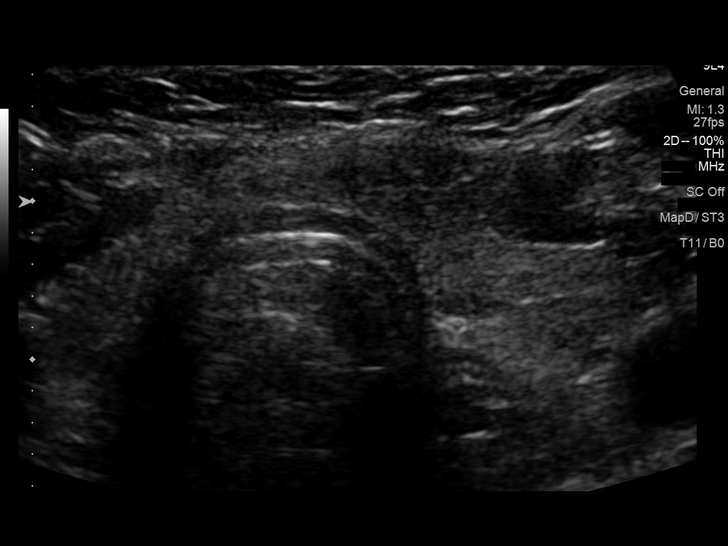
[im 8/45]
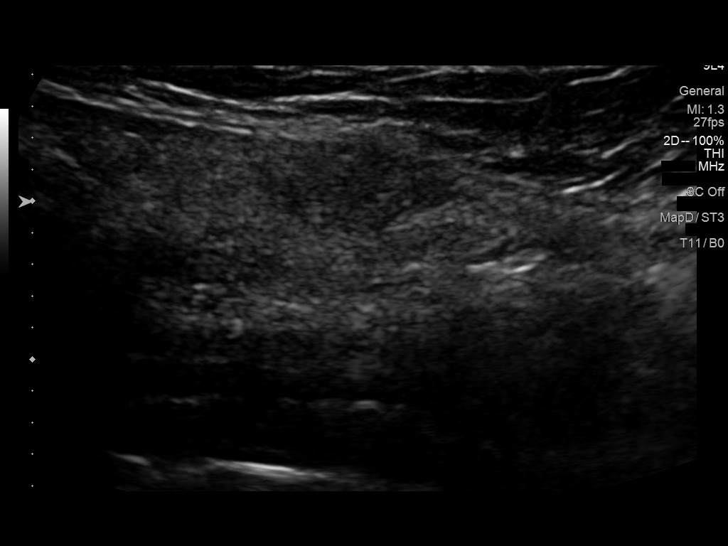
[im 12/45]
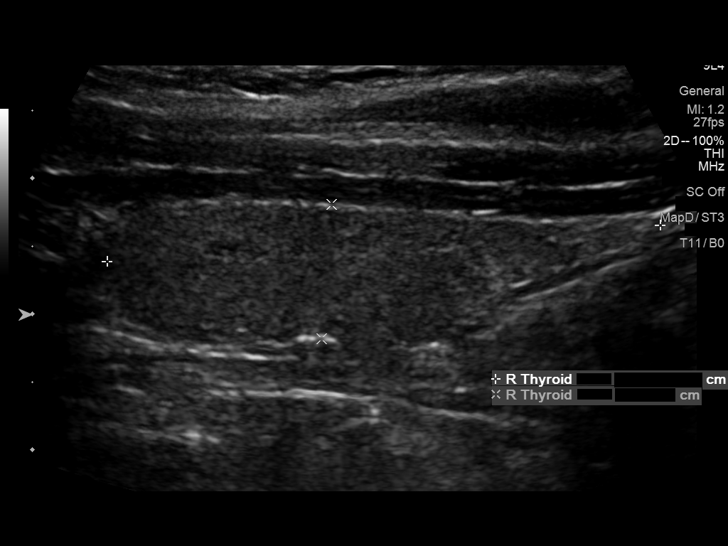
[im 15/45]
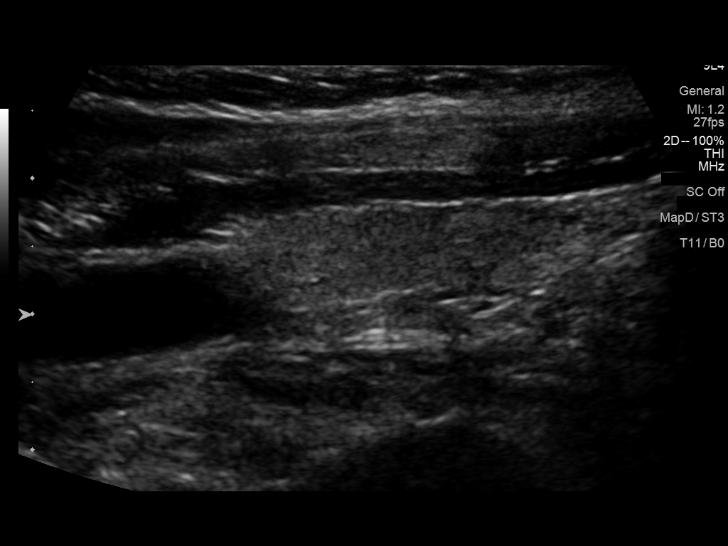
[im 19/45]
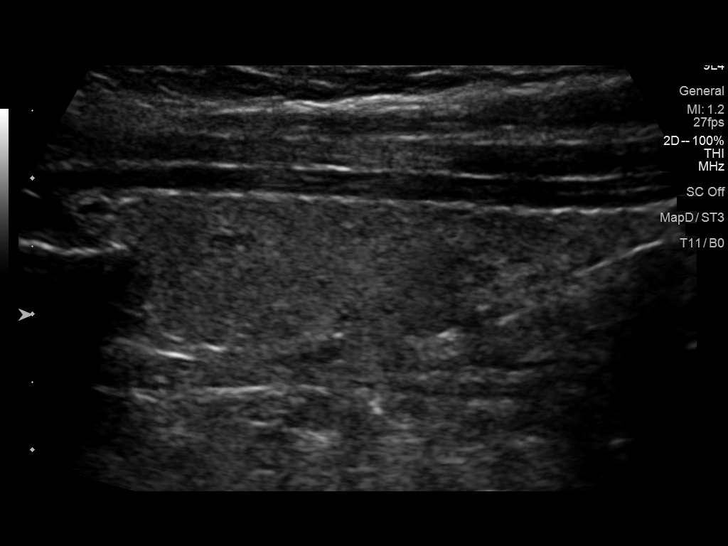
[im 23/45]
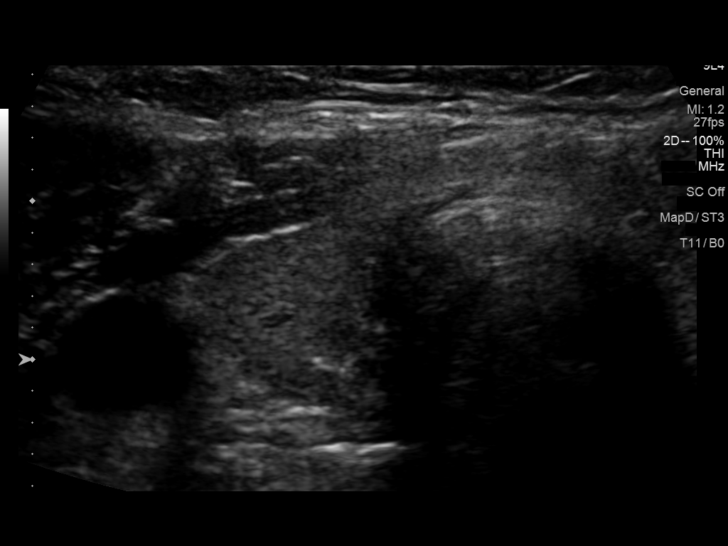
[im 26/45]
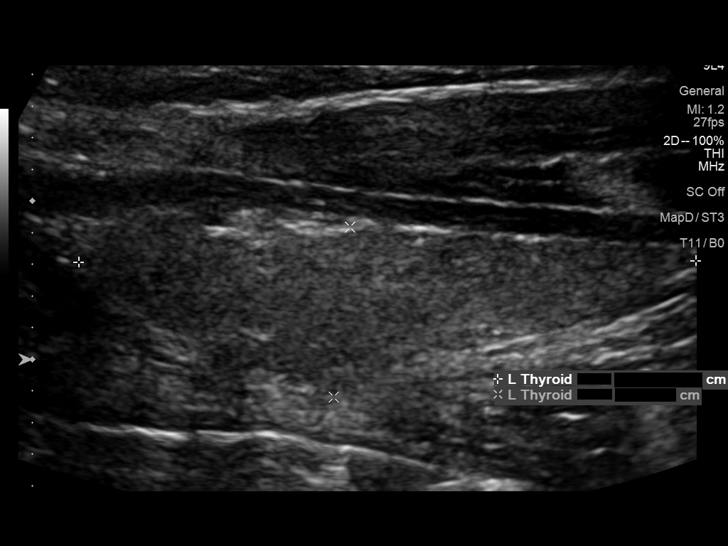
[im 30/45]
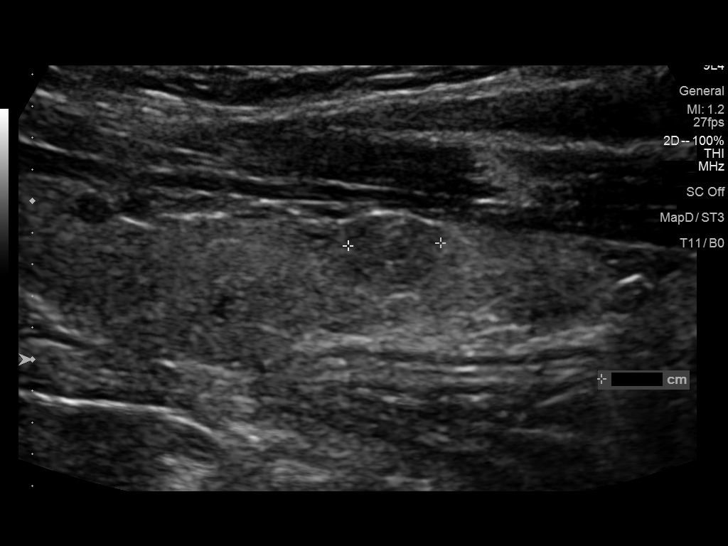
[im 34/45]
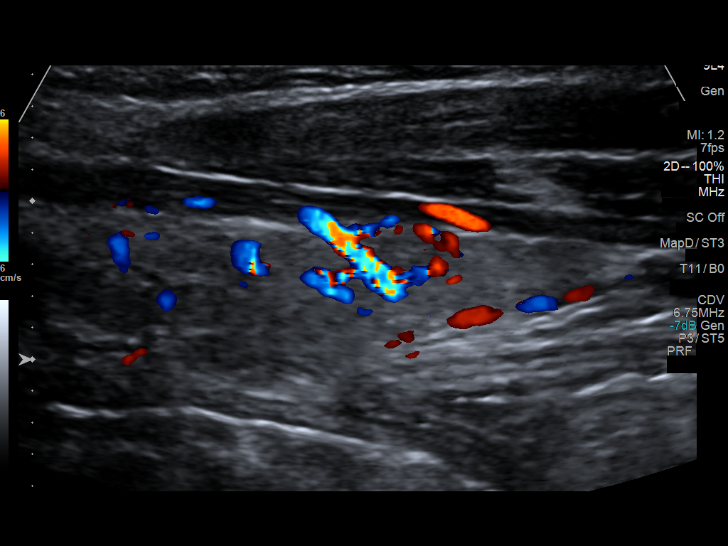
[im 37/45]
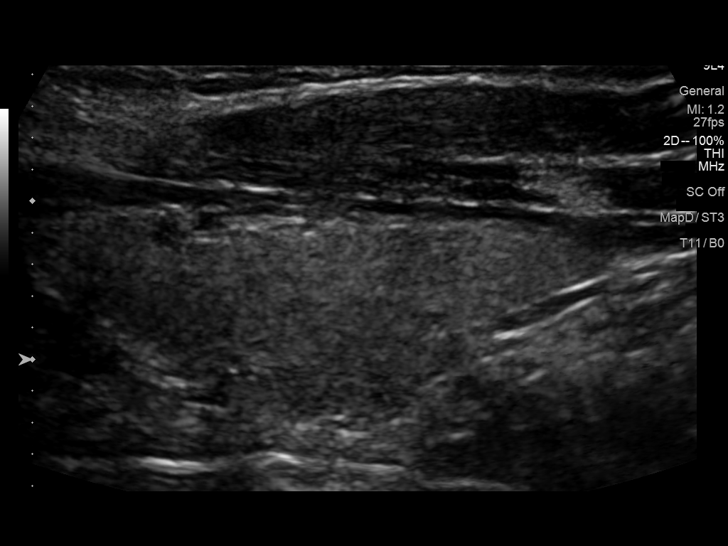
[im 41/45]
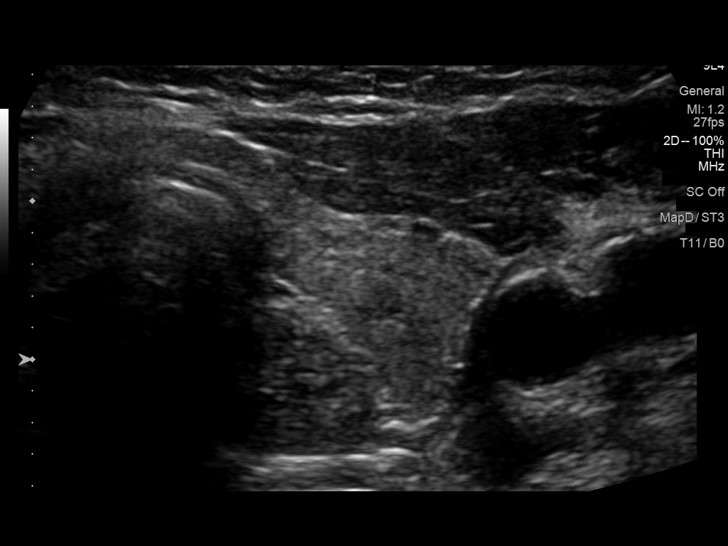
[im 45/45]
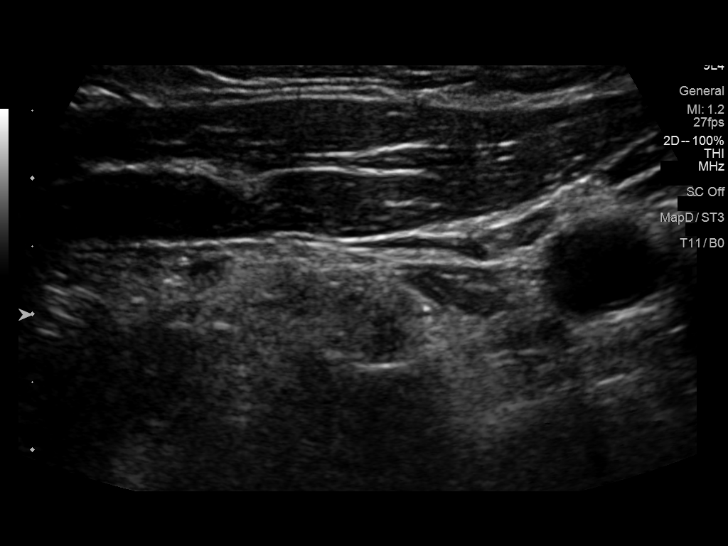

[13 of 25 positions shown; findings below may reference images not displayed]

FINDINGS: Parenchymal Echotexture: Moderately heterogenous

Isthmus: 0.4 cm, previously 0.3 cm

Right lobe: 4.1 x 1.0 x 1.2 cm, previously 3.8 x 1.0 x 1.2 cm

Left lobe: 3.9 x 1.1 x 1.3 cm, previously 4.1 x 1.0 x 1.5 cm

_________________________________________________________

Estimated total number of nodules >/= 1 cm: 1

Number of spongiform nodules >/=  2 cm not described below (TR1): 0

Number of mixed cystic and solid nodules >/= 1.5 cm not described
below (TR2): 0

_________________________________________________________

Nodule # 1:

Prior biopsy: No

Location: Isthmus; Mid

Maximum size: 1.1 cm; Other 2 dimensions: 1.0 x 0.7 cm, previously,
1.2 x 0.9 x 0.5 cm

Composition: solid/almost completely solid (2)

Echogenicity: hypoechoic (2)

Shape: not taller-than-wide (0)

Margins: ill-defined (0)

Echogenic foci: none (0)

ACR TI-RADS total points: 4.

ACR TI-RADS risk category:  TR4 (4-6 points).

Significant change in size (>/= 20% in two dimensions and minimal
increase of 2 mm): No

Change in features: No

Change in ACR TI-RADS risk category: No

ACR TI-RADS recommendations:

*Given size (>/= 1 - 1.4 cm) and appearance, a follow-up ultrasound
in 1 year should be considered based on TI-RADS criteria.

_________________________________________________________

Unchanged appearance of the additional scattered 0.6 cm or less
benign thyroid nodules. No new worrisome nodules.
IMPRESSION: Unchanged appearance of the solid nodule in the thyroid isthmus
(labeled 1), which continues to meet criteria for annual ultrasound
follow-up until 5 years of stability are established (TIRADS
category 4). This study marks 3 year stability.

The above is in keeping with the ACR TI-RADS recommendations - [HOSPITAL] 7496;[DATE].

## 2021-03-23 MED ORDER — AMPHETAMINE-DEXTROAMPHET ER 20 MG PO CP24: 20.0000 mg | ORAL_CAPSULE | Freq: Every day | ORAL | 0 refills | Status: DC

## 2021-03-23 MED ORDER — ZOLPIDEM TARTRATE 10 MG PO TABS
ORAL_TABLET | ORAL | 2 refills | Status: DC
Start: 2021-03-23 — End: 2021-07-05

## 2021-03-23 MED ORDER — LAMOTRIGINE 100 MG PO TABS: ORAL_TABLET | ORAL | 5 refills | Status: DC

## 2021-03-23 MED ORDER — ALPRAZOLAM 1 MG PO TABS: ORAL_TABLET | ORAL | 2 refills | Status: DC

## 2021-03-23 NOTE — Progress Notes (Signed)
Tammy Randolph [id number] [birth year] 60 y.o.  Virtual Visit via Telephone Note  I connected with pt on 03/23/21 at  8:40 AM EDT by telephone and verified that I am speaking with the correct person using two identifiers.   I discussed the limitations, risks, security and privacy concerns of performing an evaluation and management service by telephone and the availability of in person appointments. I also discussed with the patient that there may be a patient responsible charge related to this service. The patient expressed understanding and agreed to proceed.   I discussed the assessment and treatment plan with the patient. The patient was provided an opportunity to ask questions and all were answered. The patient agreed with the plan and demonstrated an understanding of the instructions.   The patient was advised to call back or seek an in-person evaluation if the symptoms worsen or if the condition fails to improve as anticipated.  I provided 30 minutes of non-face-to-face time during this encounter.  The patient was located at home.  The provider was located at [place].   Aloha Gell, NP   Subjective:   Patient ID:  Tammy Randolph is a 60 y.o. (DOB [birth year]) female.  Chief Complaint: No chief complaint on file.   HPI Tammy Randolph presents for follow-up of GAD, insomnia, BPD-2, and ADHD.  Describes mood today as "ok". Pleasant. Decreased tearfulness. Mood symptoms - reports decreased depression and irritability. Denies anxiety. Stating "I'm feeling so much better". Statrted Lamictal at 25mg  and is now taking 50mg  daily. Stating "I was so depressed". Husband has also commented on her improvement. Would like to increase dose - "not quite there yet". Improved interest and motivation. 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 Taking medications as prescribed.  Energy levels lower. Active, does not have a regular exercise routine.   Enjoys some usual  interests and activities. Married. Lives with husband. Has a 65 year old son - lives local. Spending time with family. Appetite adequate - eating healthy. Concerned about weight gain. Sleeping has improved "some" - 4 hours. Focus and concentration difficulties at times. Taking Adderall daily. Completing tasks. Managing aspects of household.  Denies SI or HI.  Denies AH or VH.    Previous medication trials: Celexa, Lexapro, Effexor in addition to Viibryd, Wellbutrin, Lamictal last year, tCymbalta 30 mg    Review of Systems:  Review of Systems  Musculoskeletal: Negative for gait problem.  Neurological: Negative for tremors.  Psychiatric/Behavioral:       Please refer to HPI    Medications: I have reviewed the patient's current medications.  Current Outpatient Medications  Medication Sig Dispense Refill  . albuterol (PROVENTIL HFA;VENTOLIN HFA) 108 (90 BASE) MCG/ACT inhaler Inhale 1-2 puffs into the lungs every 6 (six) hours as needed for wheezing or shortness of breath.    . ALPRAZolam (XANAX) 1 MG tablet Take one tablet four times daily. 120 tablet 2  . amphetamine-dextroamphetamine (ADDERALL XR) 20 MG 24 hr capsule Take 1 capsule (20 mg total) by mouth daily after breakfast. 30 capsule 0  . buPROPion (WELLBUTRIN XL) 150 MG 24 hr tablet Take 3 tablets (450 mg total) by mouth every morning. 90 tablet 5  . carvedilol (COREG) 12.5 MG tablet Take 1.5 tablets (18.75 mg total) by mouth 2 (two) times daily with a meal. 270 tablet 3  . lamoTRIgine (LAMICTAL) 100 MG tablet Take one tablet daily. 30 tablet 5  . ondansetron (ZOFRAN) 4 MG tablet Take 1 tablet (4  mg total) by mouth every 8 (eight) hours as needed for nausea or vomiting. 20 tablet 0  . oxyCODONE-acetaminophen (PERCOCET) 10-325 MG tablet Take 1 tablet by mouth every 4 (four) hours as needed for pain. 60 tablet 0  . pseudoephedrine-acetaminophen (TYLENOL SINUS) 30-500 MG TABS Take 1 tablet by mouth every 4 (four) hours as needed  (Seasonal Allergies).    Marland Kitchen spironolactone (ALDACTONE) 25 MG tablet TAKE 1 TABLET BY MOUTH DAILY 90 tablet 3  . SUMAtriptan (IMITREX) 50 MG tablet as directed. TAKE 1 TABLET BY MOUTH AS NEEDED    . zolpidem (AMBIEN) 10 MG tablet Take one tablet by mouth every night at bedtime 30 tablet 2   No current facility-administered medications for this visit.    Medication Side Effects: None  Allergies:  Allergies  Allergen Reactions  . Hydrocodone-Acetaminophen Itching and Rash  . Morphine Other (See Comments)    Pt did not like the way it made her feel    Past Medical History:  Diagnosis Date  . Adenomatous colon polyp 1996  . Anxiety   . ANXIETY 07/15/2008   Qualifier: Diagnosis of  By: Ronnald Ramp CMA, Chemira    . Arthritis   . Asthma   . Bipolar disorder ([place])   . Chronic fatigue   . Depression   . History of blood transfusion 2003  . Hypertension   . Pneumonia    couple of times last being last year  . PONV (postoperative nausea and vomiting)   . Seasonal allergies   . Spinal headache     Family History  Problem Relation Age of Onset  . Colon cancer Brother 40  . Colon polyps Brother   . Stomach cancer Maternal Uncle 11  . Colon cancer Paternal Uncle 81  . Colon cancer Maternal Grandmother 2  . Sleep apnea Father   . CVA Father   . Sleep apnea Sister   . Hypertension Sister   . Coronary artery disease Paternal Aunt   . Pancreatic cancer Paternal Uncle   . CVA Mother        x3  . Hypertension Mother   . Arthritis Mother   . Breast cancer Mother   . Heart Problems Mother        pacemaker  . Heart attack Maternal Uncle   . Clotting disorder Brother   . Hypertension Sister     Social History   Socioeconomic History  . Marital status: Married    Spouse name: Not on file  . Number of children: Not on file  . Years of education: Not on file  . Highest education level: Not on file  Occupational History  . Not on file  Tobacco Use  . Smoking status: Former  Smoker    Quit date: 12/26/1986    Years since quitting: 34.2  . Smokeless tobacco: Never Used  . Tobacco comment: up to 1& 1/2packs /week  Vaping Use  . Vaping Use: Never used  Substance and Sexual Activity  . Alcohol use: Yes    Alcohol/week: 0.0 standard drinks    Comment: socially  . Drug use: No  . Sexual activity: Not on file  Other Topics Concern  . Not on file  Social History Narrative  . Not on file   Social Determinants of Health   Financial Resource Strain: Not on file  Food Insecurity: Not on file  Transportation Needs: Not on file  Physical Activity: Not on file  Stress: Not on file  Social Connections: Not on  file  Intimate Partner Violence: Not on file    Past Medical History, Surgical history, Social history, and Family history were reviewed and updated as appropriate.   Please see review of systems for further details on the patient's review from today.   Objective:   Physical Exam:  There were no vitals taken for this visit.  Physical Exam Constitutional:      General: She is not in acute distress. Musculoskeletal:        General: No deformity.  Neurological:     Mental Status: She is alert and oriented to person, place, and time.     Coordination: Coordination normal.  Psychiatric:        Attention and Perception: Attention and perception normal. She does not perceive auditory or visual hallucinations.        Mood and Affect: Mood normal. Mood is not anxious or depressed. Affect is not labile, blunt, angry or inappropriate.        Speech: Speech normal.        Behavior: Behavior normal.        Thought Content: Thought content normal. Thought content is not paranoid or delusional. Thought content does not include homicidal or suicidal ideation. Thought content does not include homicidal or suicidal plan.        Cognition and Memory: Cognition and memory normal.        Judgment: Judgment normal.     Comments: Insight intact     Lab Review:      Component Value Date/Time   NA 137 09/06/2020 1858   NA 138 01/17/2018 0846   K 4.0 09/06/2020 1858   CL 97 (L) 09/06/2020 1858   CO2 29 09/06/2020 1858   GLUCOSE 94 09/06/2020 1858   BUN 15 09/06/2020 1858   BUN 11 01/17/2018 0846   CREATININE 0.85 09/06/2020 1858   CALCIUM 9.8 09/06/2020 1858   PROT 8.1 09/06/2020 1858   ALBUMIN 4.7 09/06/2020 1858   AST 23 09/06/2020 1858   ALT 19 09/06/2020 1858   ALKPHOS 92 09/06/2020 1858   BILITOT 0.4 09/06/2020 1858   GFRNONAA >60 09/06/2020 1858   GFRAA >60 09/06/2020 1858       Component Value Date/Time   WBC 6.8 09/06/2020 1858   RBC 5.11 09/06/2020 1858   HGB 15.4 (H) 09/06/2020 1858   HGB 13.7 02/02/2011 1448   HCT 46.0 09/06/2020 1858   HCT 39.3 02/02/2011 1448   PLT 262 09/06/2020 1858   PLT 264 02/02/2011 1448   MCV 90.0 09/06/2020 1858   MCV 89.3 02/02/2011 1448   MCH 30.1 09/06/2020 1858   MCHC 33.5 09/06/2020 1858   RDW 12.2 09/06/2020 1858   RDW 12.3 02/02/2011 1448   LYMPHSABS 2.4 09/06/2020 1858   LYMPHSABS 2.0 02/02/2011 1448   MONOABS 0.7 09/06/2020 1858   MONOABS 0.5 02/02/2011 1448   EOSABS 0.4 09/06/2020 1858   EOSABS 0.2 02/02/2011 1448   BASOSABS 0.1 09/06/2020 1858   BASOSABS 0.0 02/02/2011 1448    No results found for: POCLITH, LITHIUM   No results found for: PHENYTOIN, PHENOBARB, VALPROATE, CBMZ   .res Assessment: Plan:     Plan:  PDMP reviewed  1. Wellbutrin 150 mg XL taking 3 tablets total 450 mg every morning 2. Adderall 20 mg XR daily in the morning - not taking every day. 3. Ambien 10mg  at hs 4. Xanax 1mg  - 4 x daily 5. Increase Lamictal 50mg  to 100mg  daily  RTC 8 weeks  Patient advised  to contact office with any questions, adverse effects, or acute worsening in signs and symptoms.  Discussed potential benefits, risk, and side effects of benzodiazepines to include potential risk of tolerance and dependence, as well as possible drowsiness.  Advised patient not to drive if  experiencing drowsiness and to take lowest possible effective dose to minimize risk of dependence and tolerance.  Discussed potential benefits, risks, and side effects of stimulants with patient to include increased heart rate, palpitations, insomnia, increased anxiety, increased irritability, or decreased appetite.  Instructed patient to contact office if experiencing any significant tolerability issues.  Counseled patient regarding potential benefits, risks, and side effects of Lamictal to include potential risk of Stevens-Johnson syndrome. Advised patient to stop taking Lamictal and contact office immediately if rash develops and to seek urgent medical attention if rash is severe and/or spreading quickly.   Diagnoses and all orders for this visit:  Attention deficit hyperactivity disorder (ADHD), inattentive type, moderate -     amphetamine-dextroamphetamine (ADDERALL XR) 20 MG 24 hr capsule; Take 1 capsule (20 mg total) by mouth daily after breakfast.  Bipolar II disorder, mild, depressed, with anxious distress, in partial remission (HCC) -     lamoTRIgine (LAMICTAL) 100 MG tablet; Take one tablet daily. -     ALPRAZolam (XANAX) 1 MG tablet; Take one tablet four times daily. -     zolpidem (AMBIEN) 10 MG tablet; Take one tablet by mouth every night at bedtime  Generalized anxiety disorder -     ALPRAZolam (XANAX) 1 MG tablet; Take one tablet four times daily. -     zolpidem (AMBIEN) 10 MG tablet; Take one tablet by mouth every night at bedtime  Insomnia, unspecified type -     zolpidem (AMBIEN) 10 MG tablet; Take one tablet by mouth every night at bedtime    Please see After Visit Summary for patient specific instructions.  No future appointments.  No orders of the defined types were placed in this encounter.     -------------------------------

## 2021-03-24 DIAGNOSIS — M25512 Pain in left shoulder: Secondary | ICD-10-CM | POA: Diagnosis not present

## 2021-03-24 DIAGNOSIS — M25612 Stiffness of left shoulder, not elsewhere classified: Secondary | ICD-10-CM | POA: Diagnosis not present

## 2021-03-29 DIAGNOSIS — N941 Unspecified dyspareunia: Secondary | ICD-10-CM | POA: Diagnosis not present

## 2021-03-29 DIAGNOSIS — N952 Postmenopausal atrophic vaginitis: Secondary | ICD-10-CM | POA: Diagnosis not present

## 2021-03-29 DIAGNOSIS — N3010 Interstitial cystitis (chronic) without hematuria: Secondary | ICD-10-CM | POA: Diagnosis not present

## 2021-03-29 DIAGNOSIS — M6289 Other specified disorders of muscle: Secondary | ICD-10-CM | POA: Diagnosis not present

## 2021-04-06 DIAGNOSIS — M25512 Pain in left shoulder: Secondary | ICD-10-CM | POA: Diagnosis not present

## 2021-04-06 DIAGNOSIS — M25612 Stiffness of left shoulder, not elsewhere classified: Secondary | ICD-10-CM | POA: Diagnosis not present

## 2021-04-20 DIAGNOSIS — N951 Menopausal and female climacteric states: Secondary | ICD-10-CM | POA: Diagnosis not present

## 2021-04-20 DIAGNOSIS — R6882 Decreased libido: Secondary | ICD-10-CM | POA: Diagnosis not present

## 2021-04-20 DIAGNOSIS — N94810 Vulvar vestibulitis: Secondary | ICD-10-CM | POA: Diagnosis not present

## 2021-04-20 DIAGNOSIS — E348 Other specified endocrine disorders: Secondary | ICD-10-CM | POA: Diagnosis not present

## 2021-04-28 ENCOUNTER — Telehealth: Payer: Self-pay

## 2021-04-28 NOTE — Telephone Encounter (Signed)
Fax received from [facility] if ok for Pt to take low dose transdermal hormone therapy.  Per Dr. Corliss Parish to receive therapy.

## 2021-05-04 DIAGNOSIS — J0120 Acute ethmoidal sinusitis, unspecified: Secondary | ICD-10-CM | POA: Diagnosis not present

## 2021-05-04 DIAGNOSIS — N94810 Vulvar vestibulitis: Secondary | ICD-10-CM | POA: Diagnosis not present

## 2021-05-04 DIAGNOSIS — N951 Menopausal and female climacteric states: Secondary | ICD-10-CM | POA: Diagnosis not present

## 2021-05-04 DIAGNOSIS — E348 Other specified endocrine disorders: Secondary | ICD-10-CM | POA: Diagnosis not present

## 2021-05-10 DIAGNOSIS — R0982 Postnasal drip: Secondary | ICD-10-CM | POA: Diagnosis not present

## 2021-05-10 DIAGNOSIS — R051 Acute cough: Secondary | ICD-10-CM | POA: Diagnosis not present

## 2021-05-19 ENCOUNTER — Telehealth: Payer: Self-pay | Admitting: Adult Health

## 2021-05-19 ENCOUNTER — Other Ambulatory Visit: Payer: Self-pay

## 2021-05-19 DIAGNOSIS — F900 Attention-deficit hyperactivity disorder, predominantly inattentive type: Secondary | ICD-10-CM

## 2021-05-19 MED ORDER — AMPHETAMINE-DEXTROAMPHET ER 20 MG PO CP24: 20.0000 mg | ORAL_CAPSULE | Freq: Every day | ORAL | 0 refills | Status: DC

## 2021-05-19 NOTE — Telephone Encounter (Signed)
Script sent  

## 2021-05-19 NOTE — Telephone Encounter (Signed)
Left message for pt to schedule appt

## 2021-05-19 NOTE — Telephone Encounter (Signed)
Last filled 03/23/21 follow up not scheduled yet.pended Admins please schedule appt

## 2021-05-19 NOTE — Telephone Encounter (Signed)
Pt left a message asking for a refill on her adderall xr 20 mg to be sent to the [facility] on [address] in [place]

## 2021-05-26 DIAGNOSIS — M25612 Stiffness of left shoulder, not elsewhere classified: Secondary | ICD-10-CM | POA: Diagnosis not present

## 2021-05-26 DIAGNOSIS — M25512 Pain in left shoulder: Secondary | ICD-10-CM | POA: Diagnosis not present

## 2021-05-31 DIAGNOSIS — J069 Acute upper respiratory infection, unspecified: Secondary | ICD-10-CM | POA: Diagnosis not present

## 2021-05-31 DIAGNOSIS — U071 COVID-19: Secondary | ICD-10-CM | POA: Diagnosis not present

## 2021-05-31 DIAGNOSIS — J018 Other acute sinusitis: Secondary | ICD-10-CM | POA: Diagnosis not present

## 2021-06-07 DIAGNOSIS — M25612 Stiffness of left shoulder, not elsewhere classified: Secondary | ICD-10-CM | POA: Diagnosis not present

## 2021-06-07 DIAGNOSIS — M25512 Pain in left shoulder: Secondary | ICD-10-CM | POA: Diagnosis not present

## 2021-06-11 DIAGNOSIS — M25612 Stiffness of left shoulder, not elsewhere classified: Secondary | ICD-10-CM | POA: Diagnosis not present

## 2021-06-11 DIAGNOSIS — M25512 Pain in left shoulder: Secondary | ICD-10-CM | POA: Diagnosis not present

## 2021-06-16 DIAGNOSIS — E348 Other specified endocrine disorders: Secondary | ICD-10-CM | POA: Diagnosis not present

## 2021-06-16 DIAGNOSIS — M25512 Pain in left shoulder: Secondary | ICD-10-CM | POA: Diagnosis not present

## 2021-06-16 DIAGNOSIS — R232 Flushing: Secondary | ICD-10-CM | POA: Diagnosis not present

## 2021-06-16 DIAGNOSIS — N94810 Vulvar vestibulitis: Secondary | ICD-10-CM | POA: Diagnosis not present

## 2021-06-16 DIAGNOSIS — N951 Menopausal and female climacteric states: Secondary | ICD-10-CM | POA: Diagnosis not present

## 2021-06-17 DIAGNOSIS — N941 Unspecified dyspareunia: Secondary | ICD-10-CM | POA: Diagnosis not present

## 2021-06-17 DIAGNOSIS — M6281 Muscle weakness (generalized): Secondary | ICD-10-CM | POA: Diagnosis not present

## 2021-06-17 DIAGNOSIS — R102 Pelvic and perineal pain: Secondary | ICD-10-CM | POA: Diagnosis not present

## 2021-06-17 DIAGNOSIS — M6289 Other specified disorders of muscle: Secondary | ICD-10-CM | POA: Diagnosis not present

## 2021-06-17 DIAGNOSIS — R32 Unspecified urinary incontinence: Secondary | ICD-10-CM | POA: Diagnosis not present

## 2021-06-17 DIAGNOSIS — R350 Frequency of micturition: Secondary | ICD-10-CM | POA: Diagnosis not present

## 2021-06-17 DIAGNOSIS — R159 Full incontinence of feces: Secondary | ICD-10-CM | POA: Diagnosis not present

## 2021-06-17 DIAGNOSIS — R3915 Urgency of urination: Secondary | ICD-10-CM | POA: Diagnosis not present

## 2021-06-20 ENCOUNTER — Other Ambulatory Visit: Payer: Self-pay | Admitting: Adult Health

## 2021-06-20 DIAGNOSIS — F3181 Bipolar II disorder: Secondary | ICD-10-CM

## 2021-06-21 DIAGNOSIS — L255 Unspecified contact dermatitis due to plants, except food: Secondary | ICD-10-CM | POA: Diagnosis not present

## 2021-07-05 ENCOUNTER — Other Ambulatory Visit: Payer: Self-pay

## 2021-07-05 ENCOUNTER — Ambulatory Visit: Payer: BC Managed Care – PPO | Admitting: Adult Health

## 2021-07-05 ENCOUNTER — Encounter: Payer: Self-pay | Admitting: Adult Health

## 2021-07-05 DIAGNOSIS — F900 Attention-deficit hyperactivity disorder, predominantly inattentive type: Secondary | ICD-10-CM | POA: Diagnosis not present

## 2021-07-05 DIAGNOSIS — F3181 Bipolar II disorder: Secondary | ICD-10-CM | POA: Diagnosis not present

## 2021-07-05 DIAGNOSIS — G4700 Insomnia, unspecified: Secondary | ICD-10-CM

## 2021-07-05 DIAGNOSIS — F411 Generalized anxiety disorder: Secondary | ICD-10-CM

## 2021-07-05 MED ORDER — LAMOTRIGINE 100 MG PO TABS: ORAL_TABLET | ORAL | 5 refills | Status: DC

## 2021-07-05 MED ORDER — ZOLPIDEM TARTRATE 10 MG PO TABS: ORAL_TABLET | ORAL | 2 refills | Status: DC

## 2021-07-05 MED ORDER — AMPHETAMINE-DEXTROAMPHET ER 20 MG PO CP24: 20.0000 mg | ORAL_CAPSULE | Freq: Every day | ORAL | 0 refills | Status: DC

## 2021-07-05 MED ORDER — BUPROPION HCL ER (XL) 150 MG PO TB24: 450.0000 mg | ORAL_TABLET | Freq: Every morning | ORAL | 5 refills | Status: DC

## 2021-07-05 NOTE — Progress Notes (Signed)
[name] [id number] 04-22-1961 60 y.o.  Subjective:   Patient ID:  Tammy Randolph is a 60 y.o. (DOB [birth year]) female.  Chief Complaint: No chief complaint on file.   HPI LUANA TATRO presents to the office today for follow-up of GAD, insomnia, BPD-2, and ADHD.  Describes mood today as "ok". Pleasant. Decreased tearfulness. Mood symptoms - denies depression and irritability and anxiety. Stating "I'm doing alright". Feels like medications are working well. Recovering from fourth case of Covid. Will be having a second rotator cuff surgery in November. Feels like medications are working well. Stable interest and motivation. 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 Taking medications as prescribed.  Energy levels lower. Active, does not have a regular exercise routine.   Enjoys some usual interests and activities. Married. Lives with husband. Has a 29 year old son - lives local. 2 daughters local. Also raised 2 nephews. Spending time with family. Appetite adequate - eating healthy. Weight gain - steroids. Sleeping has improved - troubles getting to sleep - 4 to 5 hours. Focus and concentration stable. Completing tasks. Managing aspects of household.  Denies SI or HI.  Denies AH or VH.  Previous medication trials: Celexa, Lexapro, Effexor in addition to Viibryd, Wellbutrin, Lamictal last year, Cymbalta 30 mg  Review of Systems:  Review of Systems  Musculoskeletal:  Negative for gait problem.  Neurological:  Negative for tremors.  Psychiatric/Behavioral:         Please refer to HPI   Medications: I have reviewed the patient's current medications.  Current Outpatient Medications  Medication Sig Dispense Refill   albuterol (PROVENTIL HFA;VENTOLIN HFA) 108 (90 BASE) MCG/ACT inhaler Inhale 1-2 puffs into the lungs every 6 (six) hours as needed for wheezing or shortness of breath.     ALPRAZolam (XANAX) 1 MG tablet Take one tablet four times daily. 120 tablet 2    amphetamine-dextroamphetamine (ADDERALL XR) 20 MG 24 hr capsule Take 1 capsule (20 mg total) by mouth daily after breakfast. 30 capsule 0   buPROPion (WELLBUTRIN XL) 150 MG 24 hr tablet Take 3 tablets (450 mg total) by mouth every morning. 90 tablet 5   carvedilol (COREG) 12.5 MG tablet Take 1.5 tablets (18.75 mg total) by mouth 2 (two) times daily with a meal. 270 tablet 3   lamoTRIgine (LAMICTAL) 100 MG tablet Take one tablet daily. 30 tablet 5   ondansetron (ZOFRAN) 4 MG tablet Take 1 tablet (4 mg total) by mouth every 8 (eight) hours as needed for nausea or vomiting. 20 tablet 0   oxyCODONE-acetaminophen (PERCOCET) 10-325 MG tablet Take 1 tablet by mouth every 4 (four) hours as needed for pain. 60 tablet 0   pseudoephedrine-acetaminophen (TYLENOL SINUS) 30-500 MG TABS Take 1 tablet by mouth every 4 (four) hours as needed (Seasonal Allergies).     spironolactone (ALDACTONE) 25 MG tablet TAKE 1 TABLET BY MOUTH DAILY 90 tablet 3   SUMAtriptan (IMITREX) 50 MG tablet as directed. TAKE 1 TABLET BY MOUTH AS NEEDED     zolpidem (AMBIEN) 10 MG tablet Take one tablet by mouth every night at bedtime 30 tablet 2   No current facility-administered medications for this visit.    Medication Side Effects: None  Allergies:  Allergies  Allergen Reactions   Hydrocodone-Acetaminophen Itching and Rash   Morphine Other (See Comments)    Pt did not like the way it made her feel    Past Medical History:  Diagnosis Date   Adenomatous colon polyp 1996  Anxiety    ANXIETY 07/15/2008   Qualifier: Diagnosis of  By: Ronnald Ramp CMA, Chemira     Arthritis    Asthma    Bipolar disorder ([place])    Chronic fatigue    Depression    History of blood transfusion 2003   Hypertension    Pneumonia    couple of times last being last year   PONV (postoperative nausea and vomiting)    Seasonal allergies    Spinal headache     Past Medical History, Surgical history, Social history, and Family history were reviewed and  updated as appropriate.   Please see review of systems for further details on the patient's review from today.   Objective:   Physical Exam:  There were no vitals taken for this visit.  Physical Exam Constitutional:      General: She is not in acute distress. Musculoskeletal:        General: No deformity.  Neurological:     Mental Status: She is alert and oriented to person, place, and time.     Coordination: Coordination normal.  Psychiatric:        Attention and Perception: Attention and perception normal. She does not perceive auditory or visual hallucinations.        Mood and Affect: Mood normal. Mood is not anxious or depressed. Affect is not labile, blunt, angry or inappropriate.        Speech: Speech normal.        Behavior: Behavior normal.        Thought Content: Thought content normal. Thought content is not paranoid or delusional. Thought content does not include homicidal or suicidal ideation. Thought content does not include homicidal or suicidal plan.        Cognition and Memory: Cognition and memory normal.        Judgment: Judgment normal.     Comments: Insight intact    Lab Review:     Component Value Date/Time   NA 137 09/06/2020 1858   NA 138 01/17/2018 0846   K 4.0 09/06/2020 1858   CL 97 (L) 09/06/2020 1858   CO2 29 09/06/2020 1858   GLUCOSE 94 09/06/2020 1858   BUN 15 09/06/2020 1858   BUN 11 01/17/2018 0846   CREATININE 0.85 09/06/2020 1858   CALCIUM 9.8 09/06/2020 1858   PROT 8.1 09/06/2020 1858   ALBUMIN 4.7 09/06/2020 1858   AST 23 09/06/2020 1858   ALT 19 09/06/2020 1858   ALKPHOS 92 09/06/2020 1858   BILITOT 0.4 09/06/2020 1858   GFRNONAA >60 09/06/2020 1858   GFRAA >60 09/06/2020 1858       Component Value Date/Time   WBC 6.8 09/06/2020 1858   RBC 5.11 09/06/2020 1858   HGB 15.4 (H) 09/06/2020 1858   HGB 13.7 02/02/2011 1448   HCT 46.0 09/06/2020 1858   HCT 39.3 02/02/2011 1448   PLT 262 09/06/2020 1858   PLT 264 02/02/2011 1448    MCV 90.0 09/06/2020 1858   MCV 89.3 02/02/2011 1448   MCH 30.1 09/06/2020 1858   MCHC 33.5 09/06/2020 1858   RDW 12.2 09/06/2020 1858   RDW 12.3 02/02/2011 1448   LYMPHSABS 2.4 09/06/2020 1858   LYMPHSABS 2.0 02/02/2011 1448   MONOABS 0.7 09/06/2020 1858   MONOABS 0.5 02/02/2011 1448   EOSABS 0.4 09/06/2020 1858   EOSABS 0.2 02/02/2011 1448   BASOSABS 0.1 09/06/2020 1858   BASOSABS 0.0 02/02/2011 1448    No results found for: POCLITH, LITHIUM  No results found for: PHENYTOIN, PHENOBARB, VALPROATE, CBMZ   .res Assessment: Plan:     Plan:  PDMP reviewed  1. Wellbutrin 150 mg XL taking 3 tablets total 450 mg every morning 2. Adderall 20 mg XR daily in the morning - not taking every day 3. Ambien 10mg  at hs 4. Xanax 1mg  - 4 x daily - none needed this visit  RTC 3 months  Patient advised to contact office with any questions, adverse effects, or acute worsening in signs and symptoms.  Discussed potential benefits, risk, and side effects of benzodiazepines to include potential risk of tolerance and dependence, as well as possible drowsiness.  Advised patient not to drive if experiencing drowsiness and to take lowest possible effective dose to minimize risk of dependence and tolerance.  Discussed potential benefits, risks, and side effects of stimulants with patient to include increased heart rate, palpitations, insomnia, increased anxiety, increased irritability, or decreased appetite.  Instructed patient to contact office if experiencing any significant tolerability issues.  Counseled patient regarding potential benefits, risks, and side effects of Lamictal to include potential risk of Stevens-Johnson syndrome. Advised patient to stop taking Lamictal and contact office immediately if rash develops and to seek urgent medical attention if rash is severe and/or spreading quickly.   Diagnoses and all orders for this visit:  Insomnia, unspecified type -     zolpidem (AMBIEN) 10  MG tablet; Take one tablet by mouth every night at bedtime  Attention deficit hyperactivity disorder (ADHD), inattentive type, moderate -     buPROPion (WELLBUTRIN XL) 150 MG 24 hr tablet; Take 3 tablets (450 mg total) by mouth every morning. -     amphetamine-dextroamphetamine (ADDERALL XR) 20 MG 24 hr capsule; Take 1 capsule (20 mg total) by mouth daily after breakfast.  Generalized anxiety disorder -     buPROPion (WELLBUTRIN XL) 150 MG 24 hr tablet; Take 3 tablets (450 mg total) by mouth every morning. -     zolpidem (AMBIEN) 10 MG tablet; Take one tablet by mouth every night at bedtime  Bipolar II disorder, mild, depressed, with anxious distress, in partial remission (HCC) -     buPROPion (WELLBUTRIN XL) 150 MG 24 hr tablet; Take 3 tablets (450 mg total) by mouth every morning. -     lamoTRIgine (LAMICTAL) 100 MG tablet; Take one tablet daily. -     zolpidem (AMBIEN) 10 MG tablet; Take one tablet by mouth every night at bedtime    Please see After Visit Summary for patient specific instructions.  Future Appointments  Date Time Provider [facility]  10/05/2021  9:40 AM Kamiah Fite, Berdie Ogren, NP CP-CP None    No orders of the defined types were placed in this encounter.   -------------------------------

## 2021-07-16 DIAGNOSIS — N941 Unspecified dyspareunia: Secondary | ICD-10-CM | POA: Diagnosis not present

## 2021-07-16 DIAGNOSIS — R1032 Left lower quadrant pain: Secondary | ICD-10-CM | POA: Diagnosis not present

## 2021-07-16 DIAGNOSIS — M6289 Other specified disorders of muscle: Secondary | ICD-10-CM | POA: Diagnosis not present

## 2021-07-16 DIAGNOSIS — R3915 Urgency of urination: Secondary | ICD-10-CM | POA: Diagnosis not present

## 2021-07-19 DIAGNOSIS — N3020 Other chronic cystitis without hematuria: Secondary | ICD-10-CM | POA: Diagnosis not present

## 2021-07-19 DIAGNOSIS — N3010 Interstitial cystitis (chronic) without hematuria: Secondary | ICD-10-CM | POA: Diagnosis not present

## 2021-07-19 DIAGNOSIS — R3915 Urgency of urination: Secondary | ICD-10-CM | POA: Diagnosis not present

## 2021-07-19 DIAGNOSIS — R350 Frequency of micturition: Secondary | ICD-10-CM | POA: Diagnosis not present

## 2021-08-20 ENCOUNTER — Other Ambulatory Visit: Payer: Self-pay | Admitting: Internal Medicine

## 2021-08-23 DIAGNOSIS — L851 Acquired keratosis [keratoderma] palmaris et plantaris: Secondary | ICD-10-CM | POA: Diagnosis not present

## 2021-08-23 DIAGNOSIS — M21612 Bunion of left foot: Secondary | ICD-10-CM | POA: Diagnosis not present

## 2021-08-24 DIAGNOSIS — M6289 Other specified disorders of muscle: Secondary | ICD-10-CM | POA: Diagnosis not present

## 2021-08-24 DIAGNOSIS — R102 Pelvic and perineal pain: Secondary | ICD-10-CM | POA: Diagnosis not present

## 2021-08-24 DIAGNOSIS — R350 Frequency of micturition: Secondary | ICD-10-CM | POA: Diagnosis not present

## 2021-08-24 DIAGNOSIS — N941 Unspecified dyspareunia: Secondary | ICD-10-CM | POA: Diagnosis not present

## 2021-09-09 ENCOUNTER — Other Ambulatory Visit: Payer: Self-pay

## 2021-09-09 ENCOUNTER — Telehealth: Payer: Self-pay | Admitting: Adult Health

## 2021-09-09 DIAGNOSIS — F900 Attention-deficit hyperactivity disorder, predominantly inattentive type: Secondary | ICD-10-CM

## 2021-09-09 NOTE — Telephone Encounter (Signed)
Last refill 7/13 Pended for [facility] to submit

## 2021-09-09 NOTE — Telephone Encounter (Signed)
Pt called requesting Rx for Adderall XR 20 mg 1/d @ [facility]. Apt 10/11

## 2021-09-10 MED ORDER — AMPHETAMINE-DEXTROAMPHET ER 20 MG PO CP24: 20.0000 mg | ORAL_CAPSULE | Freq: Every day | ORAL | 0 refills | Status: DC

## 2021-09-14 DIAGNOSIS — M6289 Other specified disorders of muscle: Secondary | ICD-10-CM | POA: Diagnosis not present

## 2021-09-14 DIAGNOSIS — N941 Unspecified dyspareunia: Secondary | ICD-10-CM | POA: Diagnosis not present

## 2021-09-14 DIAGNOSIS — R1032 Left lower quadrant pain: Secondary | ICD-10-CM | POA: Diagnosis not present

## 2021-10-05 ENCOUNTER — Ambulatory Visit: Payer: BC Managed Care – PPO | Admitting: Adult Health

## 2021-10-08 DIAGNOSIS — M6289 Other specified disorders of muscle: Secondary | ICD-10-CM | POA: Diagnosis not present

## 2021-10-08 DIAGNOSIS — N941 Unspecified dyspareunia: Secondary | ICD-10-CM | POA: Diagnosis not present

## 2021-10-13 ENCOUNTER — Telehealth: Payer: Self-pay | Admitting: *Deleted

## 2021-10-13 NOTE — Telephone Encounter (Signed)
   Pre-operative Risk Assessment    Patient Name: Tammy Randolph  DOB: 01/12/1961 MRN: [id number]      Request for Surgical Clearance   Procedure:   FACIAL FAT GRAFTING; B/L REDUCTION MASTOPEXY; LIPO J-PLASMA TO THE BACK  Date of Surgery: Clearance 11/17/21                          Surgeon:  DR. Geri Seminole Surgeon's Group or Practice Name:  [facility] Phone number:  [phone] Fax number:  [phone]   Type of Clearance Requested: - Medical    Type of Anesthesia:   General    Additional requests/questions: Please fax a copy of EKG to the surgeon's office.  DR. Geri Seminole ALSO REQUEST CBC w/DIFF & PLT CPT 85025 CMP CPT 80053 PT w Jeannie Done CPT 85610 PARTIAL THROMBOPLASTIN TIME, ACTIVATED [id number] DX ICD-10 CODE Z01.812 (PRE OP LABS) PER DR. Randa Evens   10/13/2021, 4:33 PM

## 2021-10-14 DIAGNOSIS — Z1329 Encounter for screening for other suspected endocrine disorder: Secondary | ICD-10-CM | POA: Diagnosis not present

## 2021-10-14 DIAGNOSIS — E291 Testicular hypofunction: Secondary | ICD-10-CM | POA: Diagnosis not present

## 2021-10-14 DIAGNOSIS — E348 Other specified endocrine disorders: Secondary | ICD-10-CM | POA: Diagnosis not present

## 2021-10-14 DIAGNOSIS — R871 Abnormal level of hormones in specimens from female genital organs: Secondary | ICD-10-CM | POA: Diagnosis not present

## 2021-10-14 NOTE — Telephone Encounter (Signed)
Primary Cardiologist:Gregg Lovena Le, MD  Chart reviewed as part of pre-operative protocol coverage. Because of Tammy Randolph's past medical history and time since last visit, he/she will require a follow-up visit in order to better assess preoperative cardiovascular risk.  Pre-op covering staff: - Please schedule appointment and call patient to inform them. - Please contact requesting surgeon's office via preferred method (i.e, phone, fax) to inform them of need for appointment prior to surgery.  If applicable, this message will also be routed to pharmacy pool and/or primary cardiologist for input on holding anticoagulant/antiplatelet agent as requested below so that this information is available at time of patient's appointment.   Deberah Pelton, NP  10/14/2021, 7:21 AM

## 2021-10-18 NOTE — Telephone Encounter (Signed)
Faxed back to requesting surgeons office to make them aware.

## 2021-10-18 NOTE — Telephone Encounter (Signed)
Patient scheduled 10/26/21 at 8:00 AM with Tommye Standard, PA-C.

## 2021-10-23 NOTE — Progress Notes (Signed)
Cardiology Office Note Date:  10/23/2021  Patient ID:  Tammy Randolph 12-Jan-1961, MRN [id number] PCP:  No primary care provider on file.  Cardiologist:  Dr. Lovena Le   Chief Complaint:   pre-op   History of Present Illness: Tammy Randolph is a 60 y.o. female with history of HTN, anxiety, Biopolar disorder, ADD.   She comes today to be seen for Dr. Lovena Le.  She was last seen by him in Oct 2017, at that time, he mentions hx of CP that had been w/u with a neg ETT, and had been quiet.  I saw her 01/10/2018 She feels well, no CP, palpitations, or SOB.  She had 2 rotator cuff surgeries last year and gout out of her usual exercise routine, though denies any exertional intolerances.  No dizziness, near syncope or syncope, tolerating her medicines well.   BP was a bit high though reported as unusual for her No changes were made, planned for annual visit   I saw her Aug 2021 All in all doing OK. She has noted her BP up of late, she was in vacation/checking on a job site in the [place] not too long ago, was seen in a clinic there for UTI her BP was noted very high (170/120) and her coreg up titrated.  They did have her back and was lower, but still not controlled and recommended she follow up once home. She has been having some headaches as well. Occasional feels her heart is fast can last a few minutes, not exertional or positional, no clear trigger Infrequently feels fleetingly lightheaded, not with change of position, also no clear trigger, is somewhat infrequent. A fleeting momentary high L chest pain, not positional or exertional.  None of these symptoms are associated with each other.  She sees a MD in [place] that functions as her PMD, he did a coronary CT last year with a score of zero She discussed significant life stressors, and suspects these drive much of her symptoms and BP elevation. Planned for BP monitoring at home, echo and Zio monitoring  Echo noted preserved LVEF,  grade I DD, monitor also looked OK, rare PAC/PVC, normal rates, no arrhythmias  She saw Dr. Lovena Le in Feb 2022, doing very well, exercising regularly with good exertional capacity and no symptoms.  Pending shoulder surgery and cleared low risk Recommended avoiding caffeine and sodium with elevated BP  She comes today for clearance prior to planned surgery pending FACIAL FAT GRAFTING; B/L REDUCTION MASTOPEXY; LIPO J-PLASMA TO THE BACK and requiring cardiology clearance.   RCRI score is zero, 0.4%  She remains very active,their demolition/infrastructure business remains very busy, she is active physically, loves to hike, has had a couple trips one to [place] and another to [place] where they did some great climbs/trails No CP, palpitations or cardiac awareness. No SOB, DOE No near syncope or syncope.   Past Medical History:  Diagnosis Date   Adenomatous colon polyp 1996   Anxiety    ANXIETY 07/15/2008   Qualifier: Diagnosis of  By: Ronnald Ramp CMA, Chemira     Arthritis    Asthma    Bipolar disorder ([place])    Chronic fatigue    Depression    History of blood transfusion 2003   Hypertension    Pneumonia    couple of times last being last year   PONV (postoperative nausea and vomiting)    Seasonal allergies    Spinal headache     Past Surgical History:  Procedure Laterality Date   ABDOMINAL HYSTERECTOMY  2004   and USO for endometriosis   ANTERIOR CERVICAL DECOMP/DISCECTOMY FUSION N/A 09/23/2015   Procedure: ANTERIOR CERVICAL DISCECTOMY FUSION C4-7     (3 LEVELS);  Surgeon: Melina Schools, MD;  Location: [facility];  Service: Orthopedics;  Laterality: N/A;   APPENDECTOMY  1988   BREAST ENHANCEMENT SURGERY Bilateral 2008   COLONOSCOPY W/ POLYPECTOMY  2013   Dr Fuller Plan   HYSTERECTOMY ABDOMINAL WITH SALPINGECTOMY     x4 for endometriosis   KNEE ARTHROSCOPY WITH ANTERIOR CRUCIATE LIGAMENT (ACL) REPAIR Right 01/26/2013   LUMBAR LAMINECTOMY  2008   TONSILLECTOMY  2009    Current Outpatient  Medications  Medication Sig Dispense Refill   albuterol (PROVENTIL HFA;VENTOLIN HFA) 108 (90 BASE) MCG/ACT inhaler Inhale 1-2 puffs into the lungs every 6 (six) hours as needed for wheezing or shortness of breath.     ALPRAZolam (XANAX) 1 MG tablet Take one tablet four times daily. 120 tablet 2   amphetamine-dextroamphetamine (ADDERALL XR) 20 MG 24 hr capsule Take 1 capsule (20 mg total) by mouth daily after breakfast. 30 capsule 0   buPROPion (WELLBUTRIN XL) 150 MG 24 hr tablet Take 3 tablets (450 mg total) by mouth every morning. 90 tablet 5   carvedilol (COREG) 12.5 MG tablet Take 1.5 tablets (18.75 mg total) by mouth 2 (two) times daily with a meal. 270 tablet 3   lamoTRIgine (LAMICTAL) 100 MG tablet Take one tablet daily. 30 tablet 5   ondansetron (ZOFRAN) 4 MG tablet Take 1 tablet (4 mg total) by mouth every 8 (eight) hours as needed for nausea or vomiting. 20 tablet 0   oxyCODONE-acetaminophen (PERCOCET) 10-325 MG tablet Take 1 tablet by mouth every 4 (four) hours as needed for pain. 60 tablet 0   pseudoephedrine-acetaminophen (TYLENOL SINUS) 30-500 MG TABS Take 1 tablet by mouth every 4 (four) hours as needed (Seasonal Allergies).     spironolactone (ALDACTONE) 25 MG tablet TAKE 1 TABLET BY MOUTH DAILY 90 tablet 3   SUMAtriptan (IMITREX) 50 MG tablet as directed. TAKE 1 TABLET BY MOUTH AS NEEDED     zolpidem (AMBIEN) 10 MG tablet Take one tablet by mouth every Randolph at bedtime 30 tablet 2   No current facility-administered medications for this visit.    Allergies:   Hydrocodone-acetaminophen and Morphine   Social History:  The patient  reports that she quit smoking about 34 years ago. Her smoking use included cigarettes. She has never used smokeless tobacco. She reports current alcohol use. She reports that she does not use drugs.   Family History:  The patient's family history includes Arthritis in her mother; Breast cancer in her mother; CVA in her father and mother; Clotting disorder  in her brother; Colon cancer (age of onset: 20) in her brother and maternal grandmother; Colon cancer (age of onset: 7) in her paternal uncle; Colon polyps in her brother; Coronary artery disease in her paternal aunt; Heart Problems in her mother; Heart attack in her maternal uncle; Hypertension in her mother, sister, and sister; Pancreatic cancer in her paternal uncle; Sleep apnea in her father and sister; Stomach cancer (age of onset: 58) in her maternal uncle.  ROS:  Please see the history of present illness.    All other systems are reviewed and otherwise negative.   PHYSICAL EXAM: VS:  There were no vitals taken for this visit. BMI: There is no height or weight on file to calculate BMI. Well nourished, well developed, in  no acute distress  HEENT: normocephalic, atraumatic  Neck: no JVD, carotid bruits or masses Cardiac:  RRR; no significant murmurs, no rubs, or gallops Lungs:  CTA b/l, no wheezing, rhonchi or rales  Abd: soft, nontender MS: no deformity, no atrophy Ext: no edema  Skin: warm and dry, no rash Neuro:  No gross deficits appreciated Psych: euthymic mood, full affect     EKG:  Done today and reviewed by myself is  SR 70bpm, normal and unchanged  09/21/20: Zio monitor 1. NSR with sinus brady and sinus tachycardia 2. Rare PVC's and PAC's including 3 consecutive PAC's 3. No atrial fib or significant pauses.   01/21/21: TTE IMPRESSIONS   1. Left ventricular ejection fraction, by estimation, is 55 to 60%. The  left ventricle has normal function. The left ventricle has no regional  wall motion abnormalities. Left ventricular diastolic parameters are  consistent with Grade I diastolic  dysfunction (impaired relaxation).   2. Right ventricular systolic function is normal. The right ventricular  size is normal. Tricuspid regurgitation signal is inadequate for assessing  PA pressure.   3. The mitral valve is normal in structure. No evidence of mitral valve   regurgitation. No evidence of mitral stenosis.   4. The aortic valve is normal in structure. Aortic valve regurgitation is  not visualized. No aortic stenosis is present.   5. Aortic dilatation noted. There is borderline dilatation of the  ascending aorta, measuring 39 mm.   6. The inferior vena cava is normal in size with greater than 50%  respiratory variability, suggesting right atrial pressure of 3 mmHg.    09/13/2019: [place] Ca scoring IMPRESSION:  Coronary calcium score of 0, corresponding with no identifiable  plaque and a very low risk of coronary heart disease.    09/21/16: ETT Blood pressure demonstrated a hypertensive response to exercise. There was no ST segment deviation noted during stress. Good exercise capacity. Hypertensive response to exercise (193/100 mmHg). No ischemia.    Recent Labs: No results found for requested labs within last 8760 hours.  No results found for requested labs within last 8760 hours.   CrCl cannot be calculated (Patient's most recent lab result is older than the maximum 21 days allowed.).   Wt Readings from Last 3 Encounters:  01/27/21 160 lb (72.6 kg)  08/07/20 157 lb (71.2 kg)  01/10/18 155 lb (70.3 kg)     Other studies reviewed: Additional studies/records reviewed today include: summarized above  ASSESSMENT AND PLAN:  1. HTN     Looks great  2. Pre-op Low cardiac risk score, low cardiac risk procedure No cardiac contraindication to the procedure planned   Disposition: we will see her back in a year and PRN  Current medicines are reviewed at length with the patient today.  The patient did not have any concerns regarding medicines.  Tammy Night, PA-C 10/23/2021 4:14 PM     [address] [place] [place] [address] [phone] (office)  [phone] (fax)

## 2021-10-26 ENCOUNTER — Encounter: Payer: Self-pay | Admitting: Physician Assistant

## 2021-10-26 ENCOUNTER — Ambulatory Visit: Payer: BC Managed Care – PPO | Admitting: Physician Assistant

## 2021-10-26 ENCOUNTER — Other Ambulatory Visit: Payer: Self-pay

## 2021-10-26 VITALS — BP 110/80 | HR 70 | Ht 65.0 in | Wt 155.2 lb

## 2021-10-26 DIAGNOSIS — Z01818 Encounter for other preprocedural examination: Secondary | ICD-10-CM

## 2021-10-26 DIAGNOSIS — I10 Essential (primary) hypertension: Secondary | ICD-10-CM | POA: Diagnosis not present

## 2021-10-26 MED ORDER — SPIRONOLACTONE 25 MG PO TABS: 25.0000 mg | ORAL_TABLET | Freq: Every day | ORAL | 3 refills | Status: DC

## 2021-10-26 MED ORDER — CARVEDILOL 12.5 MG PO TABS: 18.7500 mg | ORAL_TABLET | Freq: Two times a day (BID) | ORAL | 3 refills | Status: DC

## 2021-10-26 NOTE — Patient Instructions (Signed)
Medication Instructions:    Your physician recommends that you continue on your current medications as directed. Please refer to the Current Medication list given to you today.   *If you need a refill on your cardiac medications before your next appointment, please call your pharmacy*   Lab Work: [place]   If you have labs (blood work) drawn today and your tests are completely normal, you will receive your results only by: [place] (if you have MyChart) OR A paper copy in the mail If you have any lab test that is abnormal or we need to change your treatment, we will call you to review the results.    Testing/Procedures: NONE ORDERED  TODAY   Follow-Up: At [facility], you and your health needs are our priority.  As part of our continuing mission to provide you with exceptional heart care, we have created designated Provider Care Teams.  These Care Teams include your primary Cardiologist (physician) and Advanced Practice Providers (APPs -  Physician Assistants and Nurse Practitioners) who all work together to provide you with the care you need, when you need it.  We recommend signing up for the patient portal called "MyChart".  Sign up information is provided on this After Visit Summary.  MyChart is used to connect with patients for Virtual Visits (Telemedicine).  Patients are able to view lab/test results, encounter notes, upcoming appointments, etc.  Non-urgent messages can be sent to your provider as well.   To learn more about what you can do with MyChart, go to [email or web address].    Your next appointment:  CONTACT [facility] [phone] AS NEEDED FOR  ANY CARDIAC RELATED SYMPTOMS  The format for your next appointment:   In Person  Provider:   You may see Dr. Lovena Le  or one of the following Advanced Practice Providers on your designated Care Team:   Tommye Standard, [place] Legrand Como "Jonni Sanger" Chalmers Cater, [place]   Other Instructions

## 2021-10-29 DIAGNOSIS — M5459 Other low back pain: Secondary | ICD-10-CM | POA: Diagnosis not present

## 2021-10-29 DIAGNOSIS — Z1231 Encounter for screening mammogram for malignant neoplasm of breast: Secondary | ICD-10-CM | POA: Diagnosis not present

## 2021-11-03 DIAGNOSIS — N951 Menopausal and female climacteric states: Secondary | ICD-10-CM | POA: Diagnosis not present

## 2021-11-03 DIAGNOSIS — R61 Generalized hyperhidrosis: Secondary | ICD-10-CM | POA: Diagnosis not present

## 2021-11-03 DIAGNOSIS — R232 Flushing: Secondary | ICD-10-CM | POA: Diagnosis not present

## 2021-11-03 DIAGNOSIS — E348 Other specified endocrine disorders: Secondary | ICD-10-CM | POA: Diagnosis not present

## 2021-11-05 ENCOUNTER — Telehealth: Payer: Self-pay | Admitting: Adult Health

## 2021-11-05 ENCOUNTER — Other Ambulatory Visit: Payer: Self-pay

## 2021-11-05 DIAGNOSIS — F900 Attention-deficit hyperactivity disorder, predominantly inattentive type: Secondary | ICD-10-CM

## 2021-11-05 MED ORDER — AMPHETAMINE-DEXTROAMPHET ER 20 MG PO CP24: 20.0000 mg | ORAL_CAPSULE | Freq: Every day | ORAL | 0 refills | Status: DC

## 2021-11-05 NOTE — Telephone Encounter (Signed)
Pended.

## 2021-11-05 NOTE — Telephone Encounter (Signed)
Pt has an appt 11/22. She needs a refill on her adderall xr 20 mg.to be sent to the [facility] [address] in [place]

## 2021-11-13 DIAGNOSIS — Z01812 Encounter for preprocedural laboratory examination: Secondary | ICD-10-CM | POA: Diagnosis not present

## 2021-11-16 ENCOUNTER — Other Ambulatory Visit: Payer: Self-pay

## 2021-11-16 ENCOUNTER — Encounter: Payer: Self-pay | Admitting: Adult Health

## 2021-11-16 ENCOUNTER — Ambulatory Visit: Payer: BC Managed Care – PPO | Admitting: Adult Health

## 2021-11-16 DIAGNOSIS — F411 Generalized anxiety disorder: Secondary | ICD-10-CM | POA: Diagnosis not present

## 2021-11-16 DIAGNOSIS — G4700 Insomnia, unspecified: Secondary | ICD-10-CM | POA: Diagnosis not present

## 2021-11-16 DIAGNOSIS — F3181 Bipolar II disorder: Secondary | ICD-10-CM | POA: Diagnosis not present

## 2021-11-16 DIAGNOSIS — F900 Attention-deficit hyperactivity disorder, predominantly inattentive type: Secondary | ICD-10-CM

## 2021-11-16 MED ORDER — AMPHETAMINE-DEXTROAMPHET ER 20 MG PO CP24: 20.0000 mg | ORAL_CAPSULE | Freq: Every day | ORAL | 0 refills | Status: DC

## 2021-11-16 MED ORDER — ZOLPIDEM TARTRATE 10 MG PO TABS: ORAL_TABLET | ORAL | 2 refills | Status: DC

## 2021-11-16 MED ORDER — ALPRAZOLAM 1 MG PO TABS: ORAL_TABLET | ORAL | 2 refills | Status: DC

## 2021-11-16 MED ORDER — BUPROPION HCL ER (XL) 150 MG PO TB24: 450.0000 mg | ORAL_TABLET | Freq: Every morning | ORAL | 5 refills | Status: DC

## 2021-11-16 NOTE — Progress Notes (Signed)
Tammy Randolph [id number] 1961/10/09 60 y.o.  Subjective:   Patient ID:  Tammy Randolph is a 60 y.o. (DOB 05-Jan-1961) female.  Chief Complaint: No chief complaint on file.   HPI BRINLEY ROSETE presents to the office today for follow-up of GAD, insomnia, BPD-2, and ADHD.  Describes mood today as "ok". Pleasant. Denies tearfulness. Mood symptoms - denies depression, irritability and anxiety. Stating "I'm doing good". Feels like medications are working well. Stable interest and motivation. Involved in community services. Taking medications as prescribed.  Energy levels stable. Active, does not have a regular exercise routine.   Enjoys some usual interests and activities. Married. Lives with husband. Children local. Spending time with family and friends. Appetite adequate - healthy diet. Weight gain - 157 pounds. Sleeping better some nights than others. Averages - 4 to 5 hours. Focus and concentration stable. Completing tasks. Managing aspects of household.  Denies SI or HI.  Denies AH or VH.  Previous medication trials: Celexa, Lexapro, Effexor in addition to Viibryd, Wellbutrin, Lamictal last year, Cymbalta 30 mg   Review of Systems:  Review of Systems  Musculoskeletal:  Negative for gait problem.  Neurological:  Negative for tremors.  Psychiatric/Behavioral:         Please refer to HPI   Medications: I have reviewed the patient's current medications.  Current Outpatient Medications  Medication Sig Dispense Refill   albuterol (PROVENTIL HFA;VENTOLIN HFA) 108 (90 BASE) MCG/ACT inhaler Inhale 1-2 puffs into the lungs every 6 (six) hours as needed for wheezing or shortness of breath.     ALPRAZolam (XANAX) 1 MG tablet Take one tablet four times daily. 120 tablet 2   amphetamine-dextroamphetamine (ADDERALL XR) 20 MG 24 hr capsule Take 1 capsule (20 mg total) by mouth daily after breakfast. 30 capsule 0   baclofen (LIORESAL) 10 MG tablet 10 mg daily.     buPROPion (WELLBUTRIN XL)  150 MG 24 hr tablet Take 3 tablets (450 mg total) by mouth every morning. 90 tablet 5   carvedilol (COREG) 12.5 MG tablet Take 1.5 tablets (18.75 mg total) by mouth 2 (two) times daily with a meal. 270 tablet 3   estradiol (VIVELLE-DOT) 0.05 MG/24HR patch 1 patch 2 (two) times a week.     estradiol (VIVELLE-DOT) 0.05 MG/24HR patch Place onto the skin.     lamoTRIgine (LAMICTAL) 100 MG tablet Take one tablet daily. 30 tablet 5   lamoTRIgine (LAMICTAL) 25 MG tablet lamotrigine 25 mg tablet     Misc. Devices MISC Apply 1 gram to the vestibule daily (Estradiol 0.01% with Testosterone 0.1%)     MYRBETRIQ 50 MG TB24 tablet Take 50 mg by mouth daily.     ondansetron (ZOFRAN) 4 MG tablet Take 1 tablet (4 mg total) by mouth every 8 (eight) hours as needed for nausea or vomiting. 20 tablet 0   oxyCODONE-acetaminophen (PERCOCET) 10-325 MG tablet Take 1 tablet by mouth every 4 (four) hours as needed for pain. 60 tablet 0   pseudoephedrine-acetaminophen (TYLENOL SINUS) 30-500 MG TABS Take 1 tablet by mouth every 4 (four) hours as needed (Seasonal Allergies).     spironolactone (ALDACTONE) 25 MG tablet Take 1 tablet (25 mg total) by mouth daily. 90 tablet 3   SUMAtriptan (IMITREX) 50 MG tablet as directed. TAKE 1 TABLET BY MOUTH AS NEEDED     testosterone (ANDROGEL) 50 MG/5GM (1%) GEL SMARTSIG:Topical     zolpidem (AMBIEN) 10 MG tablet Take one tablet by mouth every night at bedtime 30 tablet 2  No current facility-administered medications for this visit.    Medication Side Effects: None  Allergies:  Allergies  Allergen Reactions   Hydrocodone-Acetaminophen Itching and Rash   Morphine Other (See Comments)    Pt did not like the way it made her feel    Past Medical History:  Diagnosis Date   Adenomatous colon polyp 1996   Anxiety    ANXIETY 07/15/2008   Qualifier: Diagnosis of  By: Ronnald Ramp CMA, Chemira     Arthritis    Asthma    Bipolar disorder ([place])    Chronic fatigue    Depression     History of blood transfusion 2003   Hypertension    Pneumonia    couple of times last being last year   PONV (postoperative nausea and vomiting)    Seasonal allergies    Spinal headache     Past Medical History, Surgical history, Social history, and Family history were reviewed and updated as appropriate.   Please see review of systems for further details on the patient's review from today.   Objective:   Physical Exam:  There were no vitals taken for this visit.  Physical Exam Constitutional:      General: She is not in acute distress. Musculoskeletal:        General: No deformity.  Neurological:     Mental Status: She is alert and oriented to person, place, and time.     Coordination: Coordination normal.  Psychiatric:        Attention and Perception: Attention and perception normal. She does not perceive auditory or visual hallucinations.        Mood and Affect: Mood normal. Mood is not anxious or depressed. Affect is not labile, blunt, angry or inappropriate.        Speech: Speech normal.        Behavior: Behavior normal.        Thought Content: Thought content normal. Thought content is not paranoid or delusional. Thought content does not include homicidal or suicidal ideation. Thought content does not include homicidal or suicidal plan.        Cognition and Memory: Cognition and memory normal.        Judgment: Judgment normal.     Comments: Insight intact    Lab Review:     Component Value Date/Time   NA 137 09/06/2020 1858   NA 138 01/17/2018 0846   K 4.0 09/06/2020 1858   CL 97 (L) 09/06/2020 1858   CO2 29 09/06/2020 1858   GLUCOSE 94 09/06/2020 1858   BUN 15 09/06/2020 1858   BUN 11 01/17/2018 0846   CREATININE 0.85 09/06/2020 1858   CALCIUM 9.8 09/06/2020 1858   PROT 8.1 09/06/2020 1858   ALBUMIN 4.7 09/06/2020 1858   AST 23 09/06/2020 1858   ALT 19 09/06/2020 1858   ALKPHOS 92 09/06/2020 1858   BILITOT 0.4 09/06/2020 1858   GFRNONAA >60 09/06/2020  1858   GFRAA >60 09/06/2020 1858       Component Value Date/Time   WBC 6.8 09/06/2020 1858   RBC 5.11 09/06/2020 1858   HGB 15.4 (H) 09/06/2020 1858   HGB 13.7 02/02/2011 1448   HCT 46.0 09/06/2020 1858   HCT 39.3 02/02/2011 1448   PLT 262 09/06/2020 1858   PLT 264 02/02/2011 1448   MCV 90.0 09/06/2020 1858   MCV 89.3 02/02/2011 1448   MCH 30.1 09/06/2020 1858   MCHC 33.5 09/06/2020 1858   RDW 12.2 09/06/2020 1858  RDW 12.3 02/02/2011 1448   LYMPHSABS 2.4 09/06/2020 1858   LYMPHSABS 2.0 02/02/2011 1448   MONOABS 0.7 09/06/2020 1858   MONOABS 0.5 02/02/2011 1448   EOSABS 0.4 09/06/2020 1858   EOSABS 0.2 02/02/2011 1448   BASOSABS 0.1 09/06/2020 1858   BASOSABS 0.0 02/02/2011 1448    No results found for: POCLITH, LITHIUM   No results found for: PHENYTOIN, PHENOBARB, VALPROATE, CBMZ   .res Assessment: Plan:    Plan:  PDMP reviewed  1. Wellbutrin 150 mg XL taking 3 tablets total 450 mg every morning 2. Adderall 20 mg XR daily in the morning - not taking every day 3. Ambien 10mg  at hs 4. Xanax 1mg  - 4 x daily - none needed this visit  RTC 3 months  Patient advised to contact office with any questions, adverse effects, or acute worsening in signs and symptoms.  Discussed potential benefits, risk, and side effects of benzodiazepines to include potential risk of tolerance and dependence, as well as possible drowsiness.  Advised patient not to drive if experiencing drowsiness and to take lowest possible effective dose to minimize risk of dependence and tolerance.  Discussed potential benefits, risks, and side effects of stimulants with patient to include increased heart rate, palpitations, insomnia, increased anxiety, increased irritability, or decreased appetite.  Instructed patient to contact office if experiencing any significant tolerability issues.  Counseled patient regarding potential benefits, risks, and side effects of Lamictal to include potential risk of  Stevens-Johnson syndrome. Advised patient to stop taking Lamictal and contact office immediately if rash develops and to seek urgent medical attention if rash is severe and/or spreading quickly.  Diagnoses and all orders for this visit:  Generalized anxiety disorder -     zolpidem (AMBIEN) 10 MG tablet; Take one tablet by mouth every night at bedtime -     buPROPion (WELLBUTRIN XL) 150 MG 24 hr tablet; Take 3 tablets (450 mg total) by mouth every morning. -     ALPRAZolam (XANAX) 1 MG tablet; Take one tablet four times daily.  Bipolar II disorder, mild, depressed, with anxious distress, in partial remission (HCC) -     zolpidem (AMBIEN) 10 MG tablet; Take one tablet by mouth every night at bedtime -     buPROPion (WELLBUTRIN XL) 150 MG 24 hr tablet; Take 3 tablets (450 mg total) by mouth every morning. -     ALPRAZolam (XANAX) 1 MG tablet; Take one tablet four times daily.  Insomnia, unspecified type -     zolpidem (AMBIEN) 10 MG tablet; Take one tablet by mouth every night at bedtime  Attention deficit hyperactivity disorder (ADHD), inattentive type, moderate -     buPROPion (WELLBUTRIN XL) 150 MG 24 hr tablet; Take 3 tablets (450 mg total) by mouth every morning. -     amphetamine-dextroamphetamine (ADDERALL XR) 20 MG 24 hr capsule; Take 1 capsule (20 mg total) by mouth daily after breakfast.    Please see After Visit Summary for patient specific instructions.  No future appointments.  No orders of the defined types were placed in this encounter.   -------------------------------

## 2022-01-04 ENCOUNTER — Ambulatory Visit: Payer: BC Managed Care – PPO | Admitting: Nurse Practitioner

## 2022-01-18 DIAGNOSIS — R159 Full incontinence of feces: Secondary | ICD-10-CM | POA: Diagnosis not present

## 2022-01-18 DIAGNOSIS — N3281 Overactive bladder: Secondary | ICD-10-CM | POA: Diagnosis not present

## 2022-01-18 DIAGNOSIS — N958 Other specified menopausal and perimenopausal disorders: Secondary | ICD-10-CM | POA: Diagnosis not present

## 2022-01-18 DIAGNOSIS — Z87440 Personal history of urinary (tract) infections: Secondary | ICD-10-CM | POA: Diagnosis not present

## 2022-01-18 DIAGNOSIS — R3129 Other microscopic hematuria: Secondary | ICD-10-CM | POA: Diagnosis not present

## 2022-02-01 DIAGNOSIS — R232 Flushing: Secondary | ICD-10-CM | POA: Diagnosis not present

## 2022-02-01 DIAGNOSIS — R7303 Prediabetes: Secondary | ICD-10-CM | POA: Diagnosis not present

## 2022-02-01 DIAGNOSIS — E348 Other specified endocrine disorders: Secondary | ICD-10-CM | POA: Diagnosis not present

## 2022-02-01 DIAGNOSIS — N951 Menopausal and female climacteric states: Secondary | ICD-10-CM | POA: Diagnosis not present

## 2022-02-01 DIAGNOSIS — R61 Generalized hyperhidrosis: Secondary | ICD-10-CM | POA: Diagnosis not present

## 2022-02-04 ENCOUNTER — Other Ambulatory Visit: Payer: Self-pay | Admitting: Adult Health

## 2022-02-04 DIAGNOSIS — F3181 Bipolar II disorder: Secondary | ICD-10-CM

## 2022-02-22 ENCOUNTER — Other Ambulatory Visit: Payer: Self-pay

## 2022-02-22 ENCOUNTER — Telehealth: Payer: Self-pay | Admitting: Adult Health

## 2022-02-22 DIAGNOSIS — F900 Attention-deficit hyperactivity disorder, predominantly inattentive type: Secondary | ICD-10-CM

## 2022-02-22 DIAGNOSIS — F411 Generalized anxiety disorder: Secondary | ICD-10-CM

## 2022-02-22 DIAGNOSIS — F3181 Bipolar II disorder: Secondary | ICD-10-CM

## 2022-02-22 DIAGNOSIS — G4700 Insomnia, unspecified: Secondary | ICD-10-CM

## 2022-02-22 MED ORDER — AMPHETAMINE-DEXTROAMPHET ER 20 MG PO CP24: 20.0000 mg | ORAL_CAPSULE | Freq: Every day | ORAL | 0 refills | Status: DC

## 2022-02-22 MED ORDER — ZOLPIDEM TARTRATE 10 MG PO TABS: ORAL_TABLET | ORAL | 2 refills | Status: DC

## 2022-02-22 NOTE — Telephone Encounter (Signed)
Pended.

## 2022-02-22 NOTE — Telephone Encounter (Signed)
Pt called and made an appt for 3/2. She needs refills on her ambien and adderall xr 20 mg. [facility] to [facility] [address] in [place]

## 2022-02-24 ENCOUNTER — Other Ambulatory Visit: Payer: Self-pay

## 2022-02-24 ENCOUNTER — Encounter: Payer: Self-pay | Admitting: Adult Health

## 2022-02-24 ENCOUNTER — Ambulatory Visit: Payer: BC Managed Care – PPO | Admitting: Adult Health

## 2022-02-24 DIAGNOSIS — F3181 Bipolar II disorder: Secondary | ICD-10-CM | POA: Diagnosis not present

## 2022-02-24 DIAGNOSIS — F900 Attention-deficit hyperactivity disorder, predominantly inattentive type: Secondary | ICD-10-CM

## 2022-02-24 DIAGNOSIS — G4700 Insomnia, unspecified: Secondary | ICD-10-CM | POA: Diagnosis not present

## 2022-02-24 DIAGNOSIS — F411 Generalized anxiety disorder: Secondary | ICD-10-CM

## 2022-02-24 MED ORDER — AMPHETAMINE-DEXTROAMPHET ER 30 MG PO CP24: 30.0000 mg | ORAL_CAPSULE | Freq: Every day | ORAL | 0 refills | Status: DC

## 2022-02-24 MED ORDER — BUPROPION HCL ER (XL) 150 MG PO TB24: 450.0000 mg | ORAL_TABLET | Freq: Every morning | ORAL | 5 refills | Status: DC

## 2022-02-24 MED ORDER — AMPHETAMINE-DEXTROAMPHET ER 20 MG PO CP24: 20.0000 mg | ORAL_CAPSULE | Freq: Every day | ORAL | 0 refills | Status: DC

## 2022-02-24 MED ORDER — AMPHETAMINE-DEXTROAMPHETAMINE 10 MG PO TABS: 10.0000 mg | ORAL_TABLET | Freq: Every day | ORAL | 0 refills | Status: DC

## 2022-02-24 MED ORDER — ZOLPIDEM TARTRATE 10 MG PO TABS: ORAL_TABLET | ORAL | 2 refills | Status: DC

## 2022-02-24 MED ORDER — ALPRAZOLAM 1 MG PO TABS: ORAL_TABLET | ORAL | 2 refills | Status: DC

## 2022-02-24 NOTE — Progress Notes (Signed)
Tammy Randolph [id number] 12-Apr-1961 61 y.o.  Subjective:   Patient ID:  Tammy Randolph is a 61 y.o. (DOB 08-Jun-1961) female.  Chief Complaint: No chief complaint on file.   HPI  SHAYMA PFEFFERLE presents to the office today for follow-up of GAD, insomnia, BPD-2, and ADHD.  Describes mood today as "ok". Pleasant. Denies tearfulness. Mood symptoms - denies depression, irritability and anxiety. Stating "I'm doing good, just a little frustrated". 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 Feels like medications are helpful. Does not feel like the Adderall XR dose is lasting throughout the day and would like to try a booster - low dose of Adderall in the afternoons. Stable interest and motivation. Involved in community services. Taking medications as prescribed.  Energy levels stable. Active, does not have a regular exercise routine.  Enjoys some usual interests and activities. Married. Lives with husband. Children local. Spending time with family and friends. Appetite adequate - healthy diet. Weight gain - 159 pounds. Sleeping better some nights than others. Averages - 5 hours. Focus and concentration stable. Completing tasks. Managing aspects of household. Working part-time and caring for her mother. Denies SI or HI.  Denies AH or VH.  Previous medication trials: Celexa, Lexapro, Effexor in addition to Viibryd, Wellbutrin, Lamictal last year, Cymbalta 30 mg  Review of Systems:  Review of Systems  Musculoskeletal:  Negative for gait problem.  Neurological:  Negative for tremors.  Psychiatric/Behavioral:         Please refer to HPI   Medications: I have reviewed the patient's current medications.  Current Outpatient Medications  Medication Sig Dispense Refill   amphetamine-dextroamphetamine (ADDERALL) 10 MG tablet Take 1 tablet (10 mg total) by mouth daily. 30 tablet 0   albuterol (PROVENTIL HFA;VENTOLIN  HFA) 108 (90 BASE) MCG/ACT inhaler Inhale 1-2 puffs into the lungs every 6 (six) hours as needed for wheezing or shortness of breath.     ALPRAZolam (XANAX) 1 MG tablet Take one tablet four times daily. 120 tablet 2   amphetamine-dextroamphetamine (ADDERALL XR) 30 MG 24 hr capsule Take 1 capsule (30 mg total) by mouth daily after breakfast. 30 capsule 0   baclofen (LIORESAL) 10 MG tablet 10 mg daily.     buPROPion (WELLBUTRIN XL) 150 MG 24 hr tablet Take 3 tablets (450 mg total) by mouth every morning. 90 tablet 5   carvedilol (COREG) 12.5 MG tablet Take 1.5 tablets (18.75 mg total) by mouth 2 (two) times daily with a meal. 270 tablet 3   estradiol (VIVELLE-DOT) 0.05 MG/24HR patch 1 patch 2 (two) times a week.     estradiol (VIVELLE-DOT) 0.05 MG/24HR patch Place onto the skin.     lamoTRIgine (LAMICTAL) 100 MG tablet TAKE 1 TABLET BY MOUTH DAILY 30 tablet 5   lamoTRIgine (LAMICTAL) 25 MG tablet lamotrigine 25 mg tablet     Misc. Devices MISC Apply 1 gram to the vestibule daily (Estradiol 0.01% with Testosterone 0.1%)     MYRBETRIQ 50 MG TB24 tablet Take 50 mg by mouth daily.     ondansetron (ZOFRAN) 4 MG tablet Take 1 tablet (4 mg total) by mouth every 8 (eight) hours as needed for nausea or vomiting. 20 tablet 0   oxyCODONE-acetaminophen (PERCOCET) 10-325 MG tablet Take 1 tablet by mouth every 4 (four) hours as needed for pain. 60 tablet 0   pseudoephedrine-acetaminophen (TYLENOL SINUS) 30-500 MG TABS Take 1 tablet by mouth every 4 (four) hours  as needed (Seasonal Allergies).     spironolactone (ALDACTONE) 25 MG tablet Take 1 tablet (25 mg total) by mouth daily. 90 tablet 3   SUMAtriptan (IMITREX) 50 MG tablet as directed. TAKE 1 TABLET BY MOUTH AS NEEDED     testosterone (ANDROGEL) 50 MG/5GM (1%) GEL SMARTSIG:Topical     zolpidem (AMBIEN) 10 MG tablet Take one tablet by mouth every night at bedtime 30 tablet 2   No current facility-administered medications for this visit.    Medication Side  Effects: None  Allergies:  Allergies  Allergen Reactions   Hydrocodone-Acetaminophen Itching and Rash   Morphine Other (See Comments)    Pt did not like the way it made her feel    Past Medical History:  Diagnosis Date   Adenomatous colon polyp 1996   Anxiety    ANXIETY 07/15/2008   Qualifier: Diagnosis of  By: Ronnald Ramp CMA, Chemira     Arthritis    Asthma    Bipolar disorder ([place])    Chronic fatigue    Depression    History of blood transfusion 2003   Hypertension    Pneumonia    couple of times last being last year   PONV (postoperative nausea and vomiting)    Seasonal allergies    Spinal headache     Past Medical History, Surgical history, Social history, and Family history were reviewed and updated as appropriate.   Please see review of systems for further details on the patient's review from today.   Objective:   Physical Exam:  There were no vitals taken for this visit.  Physical Exam Constitutional:      General: She is not in acute distress. Musculoskeletal:        General: No deformity.  Neurological:     Mental Status: She is alert and oriented to person, place, and time.     Coordination: Coordination normal.  Psychiatric:        Attention and Perception: Attention and perception normal. She does not perceive auditory or visual hallucinations.        Mood and Affect: Mood normal. Mood is not anxious or depressed. Affect is not labile, blunt, angry or inappropriate.        Speech: Speech normal.        Behavior: Behavior normal.        Thought Content: Thought content normal. Thought content is not paranoid or delusional. Thought content does not include homicidal or suicidal ideation. Thought content does not include homicidal or suicidal plan.        Cognition and Memory: Cognition and memory normal.        Judgment: Judgment normal.     Comments: Insight intact    Lab Review:     Component Value Date/Time   NA 137 09/06/2020 1858   NA 138  01/17/2018 0846   K 4.0 09/06/2020 1858   CL 97 (L) 09/06/2020 1858   CO2 29 09/06/2020 1858   GLUCOSE 94 09/06/2020 1858   BUN 15 09/06/2020 1858   BUN 11 01/17/2018 0846   CREATININE 0.85 09/06/2020 1858   CALCIUM 9.8 09/06/2020 1858   PROT 8.1 09/06/2020 1858   ALBUMIN 4.7 09/06/2020 1858   AST 23 09/06/2020 1858   ALT 19 09/06/2020 1858   ALKPHOS 92 09/06/2020 1858   BILITOT 0.4 09/06/2020 1858   GFRNONAA >60 09/06/2020 1858   GFRAA >60 09/06/2020 1858       Component Value Date/Time   WBC 6.8 09/06/2020 1858  RBC 5.11 09/06/2020 1858   HGB 15.4 (H) 09/06/2020 1858   HGB 13.7 02/02/2011 1448   HCT 46.0 09/06/2020 1858   HCT 39.3 02/02/2011 1448   PLT 262 09/06/2020 1858   PLT 264 02/02/2011 1448   MCV 90.0 09/06/2020 1858   MCV 89.3 02/02/2011 1448   MCH 30.1 09/06/2020 1858   MCHC 33.5 09/06/2020 1858   RDW 12.2 09/06/2020 1858   RDW 12.3 02/02/2011 1448   LYMPHSABS 2.4 09/06/2020 1858   LYMPHSABS 2.0 02/02/2011 1448   MONOABS 0.7 09/06/2020 1858   MONOABS 0.5 02/02/2011 1448   EOSABS 0.4 09/06/2020 1858   EOSABS 0.2 02/02/2011 1448   BASOSABS 0.1 09/06/2020 1858   BASOSABS 0.0 02/02/2011 1448    No results found for: POCLITH, LITHIUM   No results found for: PHENYTOIN, PHENOBARB, VALPROATE, CBMZ   .res Assessment: Plan:    Plan:  PDMP reviewed  1. Wellbutrin 150 mg XL taking 3 tablets total 450 mg every morning 2. Increase Adderall 20 mg to 30 mg - XR daily in the morning - not taking every day 3. Ambien 10mg  at hs 4. Xanax 1mg  - 4 x daily - none needed this visit 5. Add Adderall 10mg  - 1/2 to 1 tablet daily as needed  RTC 3 months  Patient advised to contact office with any questions, adverse effects, or acute worsening in signs and symptoms.  Discussed potential benefits, risk, and side effects of benzodiazepines to include potential risk of tolerance and dependence, as well as possible drowsiness.  Advised patient not to drive if experiencing  drowsiness and to take lowest possible effective dose to minimize risk of dependence and tolerance.  Discussed potential benefits, risks, and side effects of stimulants with patient to include increased heart rate, palpitations, insomnia, increased anxiety, increased irritability, or decreased appetite.  Instructed patient to contact office if experiencing any significant tolerability issues.  Counseled patient regarding potential benefits, risks, and side effects of Lamictal to include potential risk of Stevens-Johnson syndrome. Advised patient to stop taking Lamictal and contact office immediately if rash develops and to seek urgent medical attention if rash is severe and/or spreading quickly.   Diagnoses and all orders for this visit:  Generalized anxiety disorder -     zolpidem (AMBIEN) 10 MG tablet; Take one tablet by mouth every night at bedtime -     ALPRAZolam (XANAX) 1 MG tablet; Take one tablet four times daily. -     buPROPion (WELLBUTRIN XL) 150 MG 24 hr tablet; Take 3 tablets (450 mg total) by mouth every morning.  Bipolar II disorder, mild, depressed, with anxious distress, in partial remission (HCC) -     zolpidem (AMBIEN) 10 MG tablet; Take one tablet by mouth every night at bedtime -     ALPRAZolam (XANAX) 1 MG tablet; Take one tablet four times daily. -     buPROPion (WELLBUTRIN XL) 150 MG 24 hr tablet; Take 3 tablets (450 mg total) by mouth every morning.  Insomnia, unspecified type -     zolpidem (AMBIEN) 10 MG tablet; Take one tablet by mouth every night at bedtime  Attention deficit hyperactivity disorder (ADHD), inattentive type, moderate -     Discontinue: amphetamine-dextroamphetamine (ADDERALL XR) 20 MG 24 hr capsule; Take 1 capsule (20 mg total) by mouth daily after breakfast. -     buPROPion (WELLBUTRIN XL) 150 MG 24 hr tablet; Take 3 tablets (450 mg total) by mouth every morning. -     amphetamine-dextroamphetamine (ADDERALL) 10  MG tablet; Take 1 tablet (10 mg  total) by mouth daily. -     amphetamine-dextroamphetamine (ADDERALL XR) 30 MG 24 hr capsule; Take 1 capsule (30 mg total) by mouth daily after breakfast.     Please see After Visit Summary for patient specific instructions.  No future appointments.  No orders of the defined types were placed in this encounter.   -------------------------------

## 2022-02-28 DIAGNOSIS — R300 Dysuria: Secondary | ICD-10-CM | POA: Diagnosis not present

## 2022-03-17 DIAGNOSIS — R159 Full incontinence of feces: Secondary | ICD-10-CM | POA: Diagnosis not present

## 2022-03-24 DIAGNOSIS — N3941 Urge incontinence: Secondary | ICD-10-CM | POA: Diagnosis not present

## 2022-03-24 DIAGNOSIS — N3281 Overactive bladder: Secondary | ICD-10-CM | POA: Diagnosis not present

## 2022-03-24 DIAGNOSIS — R159 Full incontinence of feces: Secondary | ICD-10-CM | POA: Diagnosis not present

## 2022-03-24 DIAGNOSIS — N393 Stress incontinence (female) (male): Secondary | ICD-10-CM | POA: Diagnosis not present

## 2022-04-22 ENCOUNTER — Other Ambulatory Visit: Payer: Self-pay | Admitting: Adult Health

## 2022-04-22 ENCOUNTER — Telehealth: Payer: Self-pay | Admitting: Adult Health

## 2022-04-22 DIAGNOSIS — F900 Attention-deficit hyperactivity disorder, predominantly inattentive type: Secondary | ICD-10-CM

## 2022-04-22 MED ORDER — AMPHETAMINE-DEXTROAMPHET ER 30 MG PO CP24: 30.0000 mg | ORAL_CAPSULE | Freq: Every day | ORAL | 0 refills | Status: DC

## 2022-04-22 NOTE — Telephone Encounter (Signed)
Pt advised she would like refill of Adderall '30mg'$  to [place].  She has confirmed availability. ? ?No upcoming appt scheduled. ?

## 2022-04-22 NOTE — Telephone Encounter (Signed)
Script sent  

## 2022-05-02 DIAGNOSIS — N3946 Mixed incontinence: Secondary | ICD-10-CM | POA: Diagnosis not present

## 2022-05-02 DIAGNOSIS — R3129 Other microscopic hematuria: Secondary | ICD-10-CM | POA: Diagnosis not present

## 2022-05-16 DIAGNOSIS — D649 Anemia, unspecified: Secondary | ICD-10-CM | POA: Diagnosis not present

## 2022-05-16 DIAGNOSIS — Z87891 Personal history of nicotine dependence: Secondary | ICD-10-CM | POA: Diagnosis not present

## 2022-05-16 DIAGNOSIS — F32A Depression, unspecified: Secondary | ICD-10-CM | POA: Diagnosis not present

## 2022-05-16 DIAGNOSIS — K6289 Other specified diseases of anus and rectum: Secondary | ICD-10-CM | POA: Diagnosis not present

## 2022-05-16 DIAGNOSIS — N809 Endometriosis, unspecified: Secondary | ICD-10-CM | POA: Diagnosis not present

## 2022-05-16 DIAGNOSIS — I10 Essential (primary) hypertension: Secondary | ICD-10-CM | POA: Diagnosis not present

## 2022-05-16 DIAGNOSIS — F419 Anxiety disorder, unspecified: Secondary | ICD-10-CM | POA: Diagnosis not present

## 2022-05-16 DIAGNOSIS — R159 Full incontinence of feces: Secondary | ICD-10-CM | POA: Diagnosis not present

## 2022-05-16 DIAGNOSIS — Z86010 Personal history of colonic polyps: Secondary | ICD-10-CM | POA: Diagnosis not present

## 2022-05-26 DIAGNOSIS — N3946 Mixed incontinence: Secondary | ICD-10-CM | POA: Diagnosis not present

## 2022-05-26 DIAGNOSIS — Z9889 Other specified postprocedural states: Secondary | ICD-10-CM | POA: Diagnosis not present

## 2022-05-26 DIAGNOSIS — T8131XA Disruption of external operation (surgical) wound, not elsewhere classified, initial encounter: Secondary | ICD-10-CM | POA: Diagnosis not present

## 2022-06-02 DIAGNOSIS — N3946 Mixed incontinence: Secondary | ICD-10-CM | POA: Diagnosis not present

## 2022-06-02 DIAGNOSIS — R82998 Other abnormal findings in urine: Secondary | ICD-10-CM | POA: Diagnosis not present

## 2022-06-02 DIAGNOSIS — R310 Gross hematuria: Secondary | ICD-10-CM | POA: Diagnosis not present

## 2022-06-08 ENCOUNTER — Ambulatory Visit: Payer: BC Managed Care – PPO | Admitting: Dermatology

## 2022-06-08 ENCOUNTER — Encounter: Payer: Self-pay | Admitting: Dermatology

## 2022-06-08 DIAGNOSIS — Z1283 Encounter for screening for malignant neoplasm of skin: Secondary | ICD-10-CM | POA: Diagnosis not present

## 2022-06-08 DIAGNOSIS — C44519 Basal cell carcinoma of skin of other part of trunk: Secondary | ICD-10-CM | POA: Diagnosis not present

## 2022-06-08 DIAGNOSIS — D485 Neoplasm of uncertain behavior of skin: Secondary | ICD-10-CM

## 2022-06-08 NOTE — Patient Instructions (Signed)

## 2022-06-09 DIAGNOSIS — N3941 Urge incontinence: Secondary | ICD-10-CM | POA: Diagnosis not present

## 2022-06-09 DIAGNOSIS — N390 Urinary tract infection, site not specified: Secondary | ICD-10-CM | POA: Diagnosis not present

## 2022-06-09 DIAGNOSIS — Z9889 Other specified postprocedural states: Secondary | ICD-10-CM | POA: Diagnosis not present

## 2022-06-21 ENCOUNTER — Telehealth: Payer: Self-pay

## 2022-06-21 DIAGNOSIS — N390 Urinary tract infection, site not specified: Secondary | ICD-10-CM | POA: Diagnosis not present

## 2022-06-22 ENCOUNTER — Telehealth: Payer: Self-pay | Admitting: Adult Health

## 2022-06-22 ENCOUNTER — Other Ambulatory Visit: Payer: Self-pay

## 2022-06-22 DIAGNOSIS — F900 Attention-deficit hyperactivity disorder, predominantly inattentive type: Secondary | ICD-10-CM

## 2022-06-22 DIAGNOSIS — F411 Generalized anxiety disorder: Secondary | ICD-10-CM

## 2022-06-22 DIAGNOSIS — F3181 Bipolar II disorder: Secondary | ICD-10-CM

## 2022-06-22 MED ORDER — ALPRAZOLAM 1 MG PO TABS: ORAL_TABLET | ORAL | 2 refills | Status: DC

## 2022-06-22 MED ORDER — AMPHETAMINE-DEXTROAMPHET ER 30 MG PO CP24: 30.0000 mg | ORAL_CAPSULE | Freq: Every day | ORAL | 0 refills | Status: DC

## 2022-06-22 MED ORDER — AMPHETAMINE-DEXTROAMPHETAMINE 10 MG PO TABS: 10.0000 mg | ORAL_TABLET | Freq: Every day | ORAL | 0 refills | Status: DC

## 2022-06-22 NOTE — Telephone Encounter (Signed)
Pended.

## 2022-06-22 NOTE — Telephone Encounter (Signed)
Requesting refill on Adderall 10, 20 and 30 mg. Also Xanax 1 mg called to:  [id number] [address], [address] AT [facility] [address]  Phone:  [phone]  Fax:  [phone]    Per patient they are all in stock at this location.

## 2022-06-30 DIAGNOSIS — R5383 Other fatigue: Secondary | ICD-10-CM | POA: Diagnosis not present

## 2022-06-30 DIAGNOSIS — R739 Hyperglycemia, unspecified: Secondary | ICD-10-CM | POA: Diagnosis not present

## 2022-07-02 ENCOUNTER — Encounter: Payer: Self-pay | Admitting: Dermatology

## 2022-07-02 NOTE — Progress Notes (Signed)
   New Patient   Subjective  Tammy Randolph is a 61 y.o. female who presents for the following: Annual Exam (Facial lesion ).  Skin check, concerned with spot on face Location:  Duration:  Quality:  Associated Signs/Symptoms: Modifying Factors:  Severity:  Timing: Context:    The following portions of the chart were reviewed this encounter and updated as appropriate:      Objective  Well appearing patient in no apparent distress; mood and affect are within normal limits. General skin examination: No atypical pigmented lesions (all checked with dermoscopy).  Facial lesion light brown 4 mm slightly textured foods a stuck on keratosis.  1 probable new basal cell carcinoma on back will be biopsied.  Mid Back 7 mm pearly pink flattopped papule, BCC         A full examination was performed including scalp, head, eyes, ears, nose, lips, neck, chest, axillae, abdomen, back, buttocks, bilateral upper extremities, bilateral lower extremities, hands, feet, fingers, toes, fingernails, and toenails. All findings within normal limits unless otherwise noted below.  Areas beneath undergarments not fully examined   Assessment & Plan  Screening for malignant neoplasm of skin  Annual skin examination  Neoplasm of uncertain behavior of skin Mid Back  Skin / nail biopsy Type of biopsy: tangential   Informed consent: discussed and consent obtained   Timeout: patient name, date of birth, surgical site, and procedure verified   Anesthesia: the lesion was anesthetized in a standard fashion   Anesthetic:  1% lidocaine w/ epinephrine 1-100,000 local infiltration Instrument used: flexible razor blade   Hemostasis achieved with: ferric subsulfate   Outcome: patient tolerated procedure well   Post-procedure details: wound care instructions given    Specimen 1 - Surgical pathology Differential Diagnosis: SCC vs BCC  Check Margins: No

## 2022-07-07 DIAGNOSIS — L03116 Cellulitis of left lower limb: Secondary | ICD-10-CM | POA: Diagnosis not present

## 2022-07-26 ENCOUNTER — Ambulatory Visit: Payer: BC Managed Care – PPO | Admitting: Adult Health

## 2022-07-26 ENCOUNTER — Ambulatory Visit: Payer: Self-pay | Admitting: Adult Health

## 2022-07-26 ENCOUNTER — Ambulatory Visit (INDEPENDENT_AMBULATORY_CARE_PROVIDER_SITE_OTHER): Payer: BC Managed Care – PPO | Admitting: Adult Health

## 2022-07-26 DIAGNOSIS — F489 Nonpsychotic mental disorder, unspecified: Secondary | ICD-10-CM

## 2022-07-26 NOTE — Progress Notes (Signed)
Patient no show appointment. ? ?

## 2022-08-02 DIAGNOSIS — R32 Unspecified urinary incontinence: Secondary | ICD-10-CM | POA: Diagnosis not present

## 2022-08-03 ENCOUNTER — Encounter: Payer: Self-pay | Admitting: Gastroenterology

## 2022-08-10 ENCOUNTER — Encounter: Payer: Self-pay | Admitting: Adult Health

## 2022-08-10 ENCOUNTER — Ambulatory Visit: Payer: BC Managed Care – PPO | Admitting: Adult Health

## 2022-08-10 DIAGNOSIS — F411 Generalized anxiety disorder: Secondary | ICD-10-CM

## 2022-08-10 DIAGNOSIS — G4700 Insomnia, unspecified: Secondary | ICD-10-CM

## 2022-08-10 DIAGNOSIS — F900 Attention-deficit hyperactivity disorder, predominantly inattentive type: Secondary | ICD-10-CM | POA: Diagnosis not present

## 2022-08-10 DIAGNOSIS — F3181 Bipolar II disorder: Secondary | ICD-10-CM | POA: Diagnosis not present

## 2022-08-10 MED ORDER — ZOLPIDEM TARTRATE 10 MG PO TABS: ORAL_TABLET | ORAL | 2 refills | Status: DC

## 2022-08-10 MED ORDER — BUPROPION HCL ER (XL) 150 MG PO TB24: 450.0000 mg | ORAL_TABLET | Freq: Every morning | ORAL | 5 refills | Status: DC

## 2022-08-10 MED ORDER — ALPRAZOLAM 1 MG PO TABS: ORAL_TABLET | ORAL | 2 refills | Status: DC

## 2022-08-10 MED ORDER — AMPHETAMINE-DEXTROAMPHETAMINE 10 MG PO TABS: 10.0000 mg | ORAL_TABLET | Freq: Every day | ORAL | 0 refills | Status: DC

## 2022-08-10 MED ORDER — AMPHETAMINE-DEXTROAMPHET ER 30 MG PO CP24: 30.0000 mg | ORAL_CAPSULE | Freq: Every day | ORAL | 0 refills | Status: DC

## 2022-08-10 NOTE — Progress Notes (Signed)
Tammy Randolph [id number] Dec 22, 1961 61 y.o.  Subjective:   Patient ID:  Tammy Randolph is a 61 y.o. (DOB 07-Oct-1961) female.  Chief Complaint: No chief complaint on file.   HPI Tammy Randolph presents to the office today for follow-up of GAD, insomnia, BPD-2, and ADHD.  Describes mood today as "ok". Pleasant. Denies tearfulness. Mood symptoms - reports some depression, irritability and anxiety. Mood is consistent - lower lately. Stating "I'm doing ok". Repots increased situational stressors with business, family, caring for mother and father in law. Feels like medications are helpful. She and husband are doing well. Upcoming trip to [place]. Stable interest and motivation. Involved in community services. Taking medications as prescribed.  Energy levels stable. Active, does not have a regular exercise routine.  Enjoys some usual interests and activities. Married. Lives with husband. Children local. Spending time with family and friends. Appetite adequate - healthy diet. Weight gain - 159 pounds. Sleeping better some nights than others. Averages - 5 hours. Has not slept in 48 hours with caring for father in law.  Focus and concentration stable. Completing tasks. Managing aspects of household. Working part-time. Has been caring for her mother and her husband's father.. Denies SI or HI.  Denies AH or VH. Denies self harm. Denies substance use.   Previous medication trials: Celexa, Lexapro, Effexor in addition to Viibryd, Wellbutrin, Lamictal last year, Cymbalta 30 mg   Review of Systems:  Review of Systems  Musculoskeletal:  Negative for gait problem.  Neurological:  Negative for tremors.  Psychiatric/Behavioral:         Please refer to HPI    Medications: I have reviewed the patient's current medications.  Current Outpatient Medications  Medication Sig Dispense Refill   albuterol (PROVENTIL HFA;VENTOLIN HFA) 108 (90 BASE) MCG/ACT inhaler Inhale 1-2 puffs into the lungs  every 6 (six) hours as needed for wheezing or shortness of breath.     ALPRAZolam (XANAX) 1 MG tablet Take one tablet four times daily. 120 tablet 2   amphetamine-dextroamphetamine (ADDERALL XR) 30 MG 24 hr capsule Take 1 capsule (30 mg total) by mouth daily after breakfast. 30 capsule 0   amphetamine-dextroamphetamine (ADDERALL) 10 MG tablet Take 1 tablet (10 mg total) by mouth daily. 30 tablet 0   baclofen (LIORESAL) 10 MG tablet 10 mg daily.     buPROPion (WELLBUTRIN XL) 150 MG 24 hr tablet Take 3 tablets (450 mg total) by mouth every morning. 90 tablet 5   carvedilol (COREG) 12.5 MG tablet Take 1.5 tablets (18.75 mg total) by mouth 2 (two) times daily with a meal. 270 tablet 3   estradiol (VIVELLE-DOT) 0.05 MG/24HR patch 1 patch 2 (two) times a week.     estradiol (VIVELLE-DOT) 0.05 MG/24HR patch Place onto the skin.     lamoTRIgine (LAMICTAL) 100 MG tablet TAKE 1 TABLET BY MOUTH DAILY 30 tablet 5   lamoTRIgine (LAMICTAL) 25 MG tablet lamotrigine 25 mg tablet     Misc. Devices MISC Apply 1 gram to the vestibule daily (Estradiol 0.01% with Testosterone 0.1%)     MYRBETRIQ 50 MG TB24 tablet Take 50 mg by mouth daily.     ondansetron (ZOFRAN) 4 MG tablet Take 1 tablet (4 mg total) by mouth every 8 (eight) hours as needed for nausea or vomiting. 20 tablet 0   oxyCODONE-acetaminophen (PERCOCET) 10-325 MG tablet Take 1 tablet by mouth every 4 (four) hours as needed for pain. 60 tablet 0   pseudoephedrine-acetaminophen (TYLENOL SINUS) 30-500 MG TABS Take  1 tablet by mouth every 4 (four) hours as needed (Seasonal Allergies).     spironolactone (ALDACTONE) 25 MG tablet Take 1 tablet (25 mg total) by mouth daily. 90 tablet 3   SUMAtriptan (IMITREX) 50 MG tablet as directed. TAKE 1 TABLET BY MOUTH AS NEEDED     testosterone (ANDROGEL) 50 MG/5GM (1%) GEL SMARTSIG:Topical     zolpidem (AMBIEN) 10 MG tablet Take one tablet by mouth every night at bedtime 30 tablet 2   No current facility-administered  medications for this visit.    Medication Side Effects: None  Allergies:  Allergies  Allergen Reactions   Hydrocodone-Acetaminophen Itching and Rash   Morphine Other (See Comments)    Pt did not like the way it made her feel    Past Medical History:  Diagnosis Date   Adenomatous colon polyp 1996   Anxiety    ANXIETY 07/15/2008   Qualifier: Diagnosis of  By: Ronnald Ramp CMA, Chemira     Arthritis    Asthma    Bipolar disorder ([place])    Chronic fatigue    Depression    History of blood transfusion 2003   Hypertension    Pneumonia    couple of times last being last year   PONV (postoperative nausea and vomiting)    Seasonal allergies    Spinal headache     Past Medical History, Surgical history, Social history, and Family history were reviewed and updated as appropriate.   Please see review of systems for further details on the patient's review from today.   Objective:   Physical Exam:  There were no vitals taken for this visit.  Physical Exam Constitutional:      General: She is not in acute distress. Musculoskeletal:        General: No deformity.  Neurological:     Mental Status: She is alert and oriented to person, place, and time.     Coordination: Coordination normal.  Psychiatric:        Attention and Perception: Attention and perception normal. She does not perceive auditory or visual hallucinations.        Mood and Affect: Mood normal. Mood is not anxious or depressed. Affect is not labile, blunt, angry or inappropriate.        Speech: Speech normal.        Behavior: Behavior normal.        Thought Content: Thought content normal. Thought content is not paranoid or delusional. Thought content does not include homicidal or suicidal ideation. Thought content does not include homicidal or suicidal plan.        Cognition and Memory: Cognition and memory normal.        Judgment: Judgment normal.     Comments: Insight intact     Lab Review:     Component Value  Date/Time   NA 137 09/06/2020 1858   NA 138 01/17/2018 0846   K 4.0 09/06/2020 1858   CL 97 (L) 09/06/2020 1858   CO2 29 09/06/2020 1858   GLUCOSE 94 09/06/2020 1858   BUN 15 09/06/2020 1858   BUN 11 01/17/2018 0846   CREATININE 0.85 09/06/2020 1858   CALCIUM 9.8 09/06/2020 1858   PROT 8.1 09/06/2020 1858   ALBUMIN 4.7 09/06/2020 1858   AST 23 09/06/2020 1858   ALT 19 09/06/2020 1858   ALKPHOS 92 09/06/2020 1858   BILITOT 0.4 09/06/2020 1858   GFRNONAA >60 09/06/2020 1858   GFRAA >60 09/06/2020 1858  Component Value Date/Time   WBC 6.8 09/06/2020 1858   RBC 5.11 09/06/2020 1858   HGB 15.4 (H) 09/06/2020 1858   HGB 13.7 02/02/2011 1448   HCT 46.0 09/06/2020 1858   HCT 39.3 02/02/2011 1448   PLT 262 09/06/2020 1858   PLT 264 02/02/2011 1448   MCV 90.0 09/06/2020 1858   MCV 89.3 02/02/2011 1448   MCH 30.1 09/06/2020 1858   MCHC 33.5 09/06/2020 1858   RDW 12.2 09/06/2020 1858   RDW 12.3 02/02/2011 1448   LYMPHSABS 2.4 09/06/2020 1858   LYMPHSABS 2.0 02/02/2011 1448   MONOABS 0.7 09/06/2020 1858   MONOABS 0.5 02/02/2011 1448   EOSABS 0.4 09/06/2020 1858   EOSABS 0.2 02/02/2011 1448   BASOSABS 0.1 09/06/2020 1858   BASOSABS 0.0 02/02/2011 1448    No results found for: "POCLITH", "LITHIUM"   No results found for: "PHENYTOIN", "PHENOBARB", "VALPROATE", "CBMZ"   .res Assessment: Plan:    Plan:  PDMP reviewed  1. Wellbutrin 150 mg XL taking 3 tablets total 450 mg every morning 2. Adderall 30 mg - XR daily in the morning - not taking every day 3. Ambien '10mg'$  at hs 4. Xanax '1mg'$  - 4 x daily - none needed this visit 5. Adderall '10mg'$  - 1/2 to 1 tablet daily as needed - not taking every day  RTC 3 months  Patient advised to contact office with any questions, adverse effects, or acute worsening in signs and symptoms.  Discussed potential benefits, risk, and side effects of benzodiazepines to include potential risk of tolerance and dependence, as well as  possible drowsiness.  Advised patient not to drive if experiencing drowsiness and to take lowest possible effective dose to minimize risk of dependence and tolerance.  Discussed potential benefits, risks, and side effects of stimulants with patient to include increased heart rate, palpitations, insomnia, increased anxiety, increased irritability, or decreased appetite.  Instructed patient to contact office if experiencing any significant tolerability issues.  Counseled patient regarding potential benefits, risks, and side effects of Lamictal to include potential risk of Stevens-Johnson syndrome. Advised patient to stop taking Lamictal and contact office immediately if rash develops and to seek urgent medical attention if rash is severe and/or spreading quickly.   Diagnoses and all orders for this visit:  Attention deficit hyperactivity disorder (ADHD), inattentive type, moderate -     amphetamine-dextroamphetamine (ADDERALL XR) 30 MG 24 hr capsule; Take 1 capsule (30 mg total) by mouth daily after breakfast. -     amphetamine-dextroamphetamine (ADDERALL) 10 MG tablet; Take 1 tablet (10 mg total) by mouth daily. -     buPROPion (WELLBUTRIN XL) 150 MG 24 hr tablet; Take 3 tablets (450 mg total) by mouth every morning.  Generalized anxiety disorder -     buPROPion (WELLBUTRIN XL) 150 MG 24 hr tablet; Take 3 tablets (450 mg total) by mouth every morning. -     zolpidem (AMBIEN) 10 MG tablet; Take one tablet by mouth every night at bedtime -     ALPRAZolam (XANAX) 1 MG tablet; Take one tablet four times daily.  Bipolar II disorder, mild, depressed, with anxious distress, in partial remission (HCC) -     buPROPion (WELLBUTRIN XL) 150 MG 24 hr tablet; Take 3 tablets (450 mg total) by mouth every morning. -     zolpidem (AMBIEN) 10 MG tablet; Take one tablet by mouth every night at bedtime -     ALPRAZolam (XANAX) 1 MG tablet; Take one tablet four times daily.  Insomnia,  unspecified type -      zolpidem (AMBIEN) 10 MG tablet; Take one tablet by mouth every night at bedtime     Please see After Visit Summary for patient specific instructions.  No future appointments.   No orders of the defined types were placed in this encounter.   -------------------------------

## 2022-09-02 DIAGNOSIS — E041 Nontoxic single thyroid nodule: Secondary | ICD-10-CM | POA: Diagnosis not present

## 2022-09-02 DIAGNOSIS — I10 Essential (primary) hypertension: Secondary | ICD-10-CM | POA: Diagnosis not present

## 2022-09-02 DIAGNOSIS — F32A Depression, unspecified: Secondary | ICD-10-CM | POA: Diagnosis not present

## 2022-09-02 DIAGNOSIS — G4730 Sleep apnea, unspecified: Secondary | ICD-10-CM | POA: Diagnosis not present

## 2022-09-02 DIAGNOSIS — Z9882 Breast implant status: Secondary | ICD-10-CM | POA: Diagnosis not present

## 2022-09-02 DIAGNOSIS — Z79899 Other long term (current) drug therapy: Secondary | ICD-10-CM | POA: Diagnosis not present

## 2022-09-02 DIAGNOSIS — Z90710 Acquired absence of both cervix and uterus: Secondary | ICD-10-CM | POA: Diagnosis not present

## 2022-09-02 DIAGNOSIS — F419 Anxiety disorder, unspecified: Secondary | ICD-10-CM | POA: Diagnosis not present

## 2022-09-02 DIAGNOSIS — R7989 Other specified abnormal findings of blood chemistry: Secondary | ICD-10-CM | POA: Diagnosis not present

## 2022-09-30 ENCOUNTER — Other Ambulatory Visit: Payer: Self-pay | Admitting: Adult Health

## 2022-09-30 DIAGNOSIS — F900 Attention-deficit hyperactivity disorder, predominantly inattentive type: Secondary | ICD-10-CM

## 2022-09-30 DIAGNOSIS — F3181 Bipolar II disorder: Secondary | ICD-10-CM

## 2022-09-30 DIAGNOSIS — F411 Generalized anxiety disorder: Secondary | ICD-10-CM

## 2022-10-28 ENCOUNTER — Telehealth: Payer: Self-pay | Admitting: Adult Health

## 2022-10-28 ENCOUNTER — Other Ambulatory Visit: Payer: Self-pay

## 2022-10-28 DIAGNOSIS — F900 Attention-deficit hyperactivity disorder, predominantly inattentive type: Secondary | ICD-10-CM

## 2022-10-28 MED ORDER — AMPHETAMINE-DEXTROAMPHET ER 30 MG PO CP24: 30.0000 mg | ORAL_CAPSULE | Freq: Every day | ORAL | 0 refills | Status: DC

## 2022-10-28 MED ORDER — AMPHETAMINE-DEXTROAMPHETAMINE 10 MG PO TABS: 10.0000 mg | ORAL_TABLET | Freq: Every day | ORAL | 0 refills | Status: DC

## 2022-10-28 NOTE — Telephone Encounter (Signed)
Next visit is 01/03/23. Requesting refill on Adderall 10 mg and 30 mg called to:  [id number] - [place], [address] AT [facility] [address]   TMHDQ:[phone] JHE:[phone]

## 2022-10-28 NOTE — Telephone Encounter (Signed)
Pended.

## 2022-10-29 ENCOUNTER — Other Ambulatory Visit: Payer: Self-pay | Admitting: Adult Health

## 2022-10-29 DIAGNOSIS — F3181 Bipolar II disorder: Secondary | ICD-10-CM

## 2022-11-07 DIAGNOSIS — Z1231 Encounter for screening mammogram for malignant neoplasm of breast: Secondary | ICD-10-CM | POA: Diagnosis not present

## 2022-11-24 DIAGNOSIS — M5451 Vertebrogenic low back pain: Secondary | ICD-10-CM | POA: Diagnosis not present

## 2022-11-24 DIAGNOSIS — M5459 Other low back pain: Secondary | ICD-10-CM | POA: Diagnosis not present

## 2022-11-28 ENCOUNTER — Other Ambulatory Visit: Payer: Self-pay | Admitting: Physician Assistant

## 2022-11-28 ENCOUNTER — Other Ambulatory Visit: Payer: Self-pay

## 2022-11-28 MED ORDER — SPIRONOLACTONE 25 MG PO TABS: 25.0000 mg | ORAL_TABLET | Freq: Every day | ORAL | 0 refills | Status: DC

## 2022-11-30 DIAGNOSIS — H353131 Nonexudative age-related macular degeneration, bilateral, early dry stage: Secondary | ICD-10-CM | POA: Diagnosis not present

## 2022-11-30 DIAGNOSIS — H35372 Puckering of macula, left eye: Secondary | ICD-10-CM | POA: Diagnosis not present

## 2022-11-30 DIAGNOSIS — H35363 Drusen (degenerative) of macula, bilateral: Secondary | ICD-10-CM | POA: Diagnosis not present

## 2022-11-30 DIAGNOSIS — H35453 Secondary pigmentary degeneration, bilateral: Secondary | ICD-10-CM | POA: Diagnosis not present

## 2022-12-06 DIAGNOSIS — R051 Acute cough: Secondary | ICD-10-CM | POA: Diagnosis not present

## 2022-12-06 DIAGNOSIS — R509 Fever, unspecified: Secondary | ICD-10-CM | POA: Diagnosis not present

## 2022-12-06 DIAGNOSIS — J209 Acute bronchitis, unspecified: Secondary | ICD-10-CM | POA: Diagnosis not present

## 2022-12-06 DIAGNOSIS — J029 Acute pharyngitis, unspecified: Secondary | ICD-10-CM | POA: Diagnosis not present

## 2022-12-11 ENCOUNTER — Other Ambulatory Visit: Payer: Self-pay | Admitting: Physician Assistant

## 2022-12-13 DIAGNOSIS — M5451 Vertebrogenic low back pain: Secondary | ICD-10-CM | POA: Diagnosis not present

## 2022-12-21 DIAGNOSIS — M5451 Vertebrogenic low back pain: Secondary | ICD-10-CM | POA: Diagnosis not present

## 2022-12-21 DIAGNOSIS — M5416 Radiculopathy, lumbar region: Secondary | ICD-10-CM | POA: Diagnosis not present

## 2022-12-27 ENCOUNTER — Other Ambulatory Visit: Payer: Self-pay | Admitting: Adult Health

## 2022-12-27 DIAGNOSIS — F3181 Bipolar II disorder: Secondary | ICD-10-CM

## 2022-12-27 DIAGNOSIS — F411 Generalized anxiety disorder: Secondary | ICD-10-CM

## 2022-12-29 ENCOUNTER — Other Ambulatory Visit: Payer: Self-pay | Admitting: Physician Assistant

## 2023-01-03 ENCOUNTER — Ambulatory Visit: Payer: BC Managed Care – PPO | Admitting: Adult Health

## 2023-01-03 ENCOUNTER — Encounter: Payer: Self-pay | Admitting: Adult Health

## 2023-01-03 DIAGNOSIS — F3181 Bipolar II disorder: Secondary | ICD-10-CM | POA: Diagnosis not present

## 2023-01-03 DIAGNOSIS — F900 Attention-deficit hyperactivity disorder, predominantly inattentive type: Secondary | ICD-10-CM

## 2023-01-03 DIAGNOSIS — F411 Generalized anxiety disorder: Secondary | ICD-10-CM

## 2023-01-03 DIAGNOSIS — G4700 Insomnia, unspecified: Secondary | ICD-10-CM | POA: Diagnosis not present

## 2023-01-03 MED ORDER — AMPHETAMINE-DEXTROAMPHETAMINE 10 MG PO TABS: 10.0000 mg | ORAL_TABLET | Freq: Every day | ORAL | 0 refills | Status: DC

## 2023-01-03 MED ORDER — AMPHETAMINE-DEXTROAMPHET ER 30 MG PO CP24: 30.0000 mg | ORAL_CAPSULE | Freq: Every day | ORAL | 0 refills | Status: DC

## 2023-01-03 MED ORDER — ALPRAZOLAM 1 MG PO TABS: ORAL_TABLET | ORAL | 2 refills | Status: DC

## 2023-01-03 MED ORDER — BUPROPION HCL ER (XL) 150 MG PO TB24: 450.0000 mg | ORAL_TABLET | Freq: Every morning | ORAL | 5 refills | Status: DC

## 2023-01-03 MED ORDER — ZOLPIDEM TARTRATE 10 MG PO TABS: ORAL_TABLET | ORAL | 2 refills | Status: DC

## 2023-01-03 NOTE — Progress Notes (Signed)
Tammy Randolph [id number] Jul 17, 1961 62 y.o.  Subjective:   Patient ID:  Tammy Randolph is a 62 y.o. (DOB January 03, 1961) female.  Chief Complaint: No chief complaint on file.   HPI Tammy Randolph presents to the office today for follow-up of  GAD, insomnia, BPD-2, and ADHD.  Describes mood today as "ok". Pleasant. Denies tearfulness. Mood symptoms - reports some depression, irritability and anxiety. Mood is lower - "seasonal". Stating "things have been tough". Repots increased situational stressors - upcoming back surgery, reports two recent losses, and upcoming trial of brother. Feels like medications are helpful. She and husband are doing well. Stable interest and motivation. Involved in community services. Taking medications as prescribed.  Energy levels stable. Active, does not have a regular exercise routine.  Enjoys some usual interests and activities. Married. Lives with husband. Children local. Spending time with family and friends. Appetite adequate. Weight gain - 159 pounds. Sleeping better some nights than others. Averages - 5 hours.  Focus and concentration stable. Completing tasks. Managing aspects of household. Working part-time. Has been caring for family members. Denies SI or HI.  Denies AH or VH. Denies self harm. Denies substance use.   Previous medication trials: Celexa, Lexapro, Effexor in addition to Viibryd, Wellbutrin, Lamictal last year, Cymbalta 30 mg      Review of Systems:  Review of Systems  Musculoskeletal:  Negative for gait problem.  Neurological:  Negative for tremors.  Psychiatric/Behavioral:         Please refer to HPI    Medications: I have reviewed the patient's current medications.  Current Outpatient Medications  Medication Sig Dispense Refill   [START ON 01/31/2023] amphetamine-dextroamphetamine (ADDERALL XR) 30 MG 24 hr capsule Take 1 capsule (30 mg total) by mouth daily. 30 capsule 0   [START ON 02/28/2023] amphetamine-dextroamphetamine  (ADDERALL XR) 30 MG 24 hr capsule Take 1 capsule (30 mg total) by mouth daily. 30 capsule 0   [START ON 01/31/2023] amphetamine-dextroamphetamine (ADDERALL) 10 MG tablet Take 1 tablet (10 mg total) by mouth daily. 30 tablet 0   [START ON 02/28/2023] amphetamine-dextroamphetamine (ADDERALL) 10 MG tablet Take 1 tablet (10 mg total) by mouth daily. 30 tablet 0   albuterol (PROVENTIL HFA;VENTOLIN HFA) 108 (90 BASE) MCG/ACT inhaler Inhale 1-2 puffs into the lungs every 6 (six) hours as needed for wheezing or shortness of breath.     ALPRAZolam (XANAX) 1 MG tablet TAKE 1 TABLET BY MOUTH FOUR TIMES DAILY 120 tablet 2   amphetamine-dextroamphetamine (ADDERALL XR) 30 MG 24 hr capsule Take 1 capsule (30 mg total) by mouth daily after breakfast. 30 capsule 0   amphetamine-dextroamphetamine (ADDERALL) 10 MG tablet Take 1 tablet (10 mg total) by mouth daily. 30 tablet 0   baclofen (LIORESAL) 10 MG tablet 10 mg daily.     buPROPion (WELLBUTRIN XL) 150 MG 24 hr tablet Take 3 tablets (450 mg total) by mouth every morning. 90 tablet 5   carvedilol (COREG) 12.5 MG tablet Take 1 tablet (12.5 mg total) by mouth 2 (two) times daily with a meal. 270 tablet 3   estradiol (VIVELLE-DOT) 0.05 MG/24HR patch 1 patch 2 (two) times a week.     estradiol (VIVELLE-DOT) 0.05 MG/24HR patch Place onto the skin.     lamoTRIgine (LAMICTAL) 100 MG tablet TAKE 1 TABLET BY MOUTH DAILY 30 tablet 5   lamoTRIgine (LAMICTAL) 25 MG tablet lamotrigine 25 mg tablet     Misc. Devices MISC Apply 1 gram to the vestibule daily (Estradiol 0.01% with  Testosterone 0.1%)     MYRBETRIQ 50 MG TB24 tablet Take 50 mg by mouth daily.     ondansetron (ZOFRAN) 4 MG tablet Take 1 tablet (4 mg total) by mouth every 8 (eight) hours as needed for nausea or vomiting. 20 tablet 0   oxyCODONE-acetaminophen (PERCOCET) 10-325 MG tablet Take 1 tablet by mouth every 4 (four) hours as needed for pain. 60 tablet 0   pseudoephedrine-acetaminophen (TYLENOL SINUS) 30-500 MG TABS  Take 1 tablet by mouth every 4 (four) hours as needed (Seasonal Allergies).     spironolactone (ALDACTONE) 25 MG tablet Take 1 tablet (25 mg total) by mouth daily. Pt needs to make appt with provider for further refills - 2nd attempt 15 tablet 0   SUMAtriptan (IMITREX) 50 MG tablet as directed. TAKE 1 TABLET BY MOUTH AS NEEDED     testosterone (ANDROGEL) 50 MG/5GM (1%) GEL SMARTSIG:Topical     zolpidem (AMBIEN) 10 MG tablet Take one tablet by mouth every night at bedtime 30 tablet 2   No current facility-administered medications for this visit.    Medication Side Effects: None  Allergies:  Allergies  Allergen Reactions   Hydrocodone-Acetaminophen Itching and Rash   Morphine Other (See Comments)    Pt did not like the way it made her feel    Past Medical History:  Diagnosis Date   Adenomatous colon polyp 1996   Anxiety    ANXIETY 07/15/2008   Qualifier: Diagnosis of  By: Ronnald Ramp CMA, Chemira     Arthritis    Asthma    Bipolar disorder ([place])    Chronic fatigue    Depression    History of blood transfusion 2003   Hypertension    Pneumonia    couple of times last being last year   PONV (postoperative nausea and vomiting)    Seasonal allergies    Spinal headache     Past Medical History, Surgical history, Social history, and Family history were reviewed and updated as appropriate.   Please see review of systems for further details on the patient's review from today.   Objective:   Physical Exam:  There were no vitals taken for this visit.  Physical Exam Constitutional:      General: She is not in acute distress. Musculoskeletal:        General: No deformity.  Neurological:     Mental Status: She is alert and oriented to person, place, and time.     Coordination: Coordination normal.  Psychiatric:        Attention and Perception: Attention and perception normal. She does not perceive auditory or visual hallucinations.        Mood and Affect: Mood normal. Mood is not  anxious or depressed. Affect is not labile, blunt, angry or inappropriate.        Speech: Speech normal.        Behavior: Behavior normal.        Thought Content: Thought content normal. Thought content is not paranoid or delusional. Thought content does not include homicidal or suicidal ideation. Thought content does not include homicidal or suicidal plan.        Cognition and Memory: Cognition and memory normal.        Judgment: Judgment normal.     Comments: Insight intact     Lab Review:     Component Value Date/Time   NA 137 09/06/2020 1858   NA 138 01/17/2018 0846   K 4.0 09/06/2020 1858   CL 97 (L)  09/06/2020 1858   CO2 29 09/06/2020 1858   GLUCOSE 94 09/06/2020 1858   BUN 15 09/06/2020 1858   BUN 11 01/17/2018 0846   CREATININE 0.85 09/06/2020 1858   CALCIUM 9.8 09/06/2020 1858   PROT 8.1 09/06/2020 1858   ALBUMIN 4.7 09/06/2020 1858   AST 23 09/06/2020 1858   ALT 19 09/06/2020 1858   ALKPHOS 92 09/06/2020 1858   BILITOT 0.4 09/06/2020 1858   GFRNONAA >60 09/06/2020 1858   GFRAA >60 09/06/2020 1858       Component Value Date/Time   WBC 6.8 09/06/2020 1858   RBC 5.11 09/06/2020 1858   HGB 15.4 (H) 09/06/2020 1858   HGB 13.7 02/02/2011 1448   HCT 46.0 09/06/2020 1858   HCT 39.3 02/02/2011 1448   PLT 262 09/06/2020 1858   PLT 264 02/02/2011 1448   MCV 90.0 09/06/2020 1858   MCV 89.3 02/02/2011 1448   MCH 30.1 09/06/2020 1858   MCHC 33.5 09/06/2020 1858   RDW 12.2 09/06/2020 1858   RDW 12.3 02/02/2011 1448   LYMPHSABS 2.4 09/06/2020 1858   LYMPHSABS 2.0 02/02/2011 1448   MONOABS 0.7 09/06/2020 1858   MONOABS 0.5 02/02/2011 1448   EOSABS 0.4 09/06/2020 1858   EOSABS 0.2 02/02/2011 1448   BASOSABS 0.1 09/06/2020 1858   BASOSABS 0.0 02/02/2011 1448    No results found for: "POCLITH", "LITHIUM"   No results found for: "PHENYTOIN", "PHENOBARB", "VALPROATE", "CBMZ"   .res Assessment: Plan:    Plan:  PDMP reviewed  1. Wellbutrin 150 mg XL taking 3  tablets total 450 mg every morning 2. Adderall 30 mg - XR daily in the morning - not taking every day 3. Ambien '10mg'$  at hs 4. Xanax '1mg'$  - 4 x daily - none needed this visit 5. Adderall '10mg'$  - 1/2 to 1 tablet daily as needed - not taking every day  RTC 3 months  Patient advised to contact office with any questions, adverse effects, or acute worsening in signs and symptoms.  Discussed potential benefits, risk, and side effects of benzodiazepines to include potential risk of tolerance and dependence, as well as possible drowsiness.  Advised patient not to drive if experiencing drowsiness and to take lowest possible effective dose to minimize risk of dependence and tolerance.  Discussed potential benefits, risks, and side effects of stimulants with patient to include increased heart rate, palpitations, insomnia, increased anxiety, increased irritability, or decreased appetite.  Instructed patient to contact office if experiencing any significant tolerability issues.  Counseled patient regarding potential benefits, risks, and side effects of Lamictal to include potential risk of Stevens-Johnson syndrome. Advised patient to stop taking Lamictal and contact office immediately if rash develops and to seek urgent medical attention if rash is severe and/or spreading quickly.   Diagnoses and all orders for this visit:  Generalized anxiety disorder -     zolpidem (AMBIEN) 10 MG tablet; Take one tablet by mouth every night at bedtime -     ALPRAZolam (XANAX) 1 MG tablet; TAKE 1 TABLET BY MOUTH FOUR TIMES DAILY -     buPROPion (WELLBUTRIN XL) 150 MG 24 hr tablet; Take 3 tablets (450 mg total) by mouth every morning.  Bipolar II disorder, mild, depressed, with anxious distress, in partial remission (HCC) -     zolpidem (AMBIEN) 10 MG tablet; Take one tablet by mouth every night at bedtime -     ALPRAZolam (XANAX) 1 MG tablet; TAKE 1 TABLET BY MOUTH FOUR TIMES DAILY -  buPROPion (WELLBUTRIN XL) 150 MG  24 hr tablet; Take 3 tablets (450 mg total) by mouth every morning.  Insomnia, unspecified type -     zolpidem (AMBIEN) 10 MG tablet; Take one tablet by mouth every night at bedtime  Attention deficit hyperactivity disorder (ADHD), inattentive type, moderate -     amphetamine-dextroamphetamine (ADDERALL XR) 30 MG 24 hr capsule; Take 1 capsule (30 mg total) by mouth daily after breakfast. -     amphetamine-dextroamphetamine (ADDERALL) 10 MG tablet; Take 1 tablet (10 mg total) by mouth daily. -     amphetamine-dextroamphetamine (ADDERALL XR) 30 MG 24 hr capsule; Take 1 capsule (30 mg total) by mouth daily. -     amphetamine-dextroamphetamine (ADDERALL XR) 30 MG 24 hr capsule; Take 1 capsule (30 mg total) by mouth daily. -     amphetamine-dextroamphetamine (ADDERALL) 10 MG tablet; Take 1 tablet (10 mg total) by mouth daily. -     amphetamine-dextroamphetamine (ADDERALL) 10 MG tablet; Take 1 tablet (10 mg total) by mouth daily.     Please see After Visit Summary for patient specific instructions.  No future appointments.   No orders of the defined types were placed in this encounter.   -------------------------------

## 2023-01-05 DIAGNOSIS — Z4789 Encounter for other orthopedic aftercare: Secondary | ICD-10-CM | POA: Diagnosis not present

## 2023-01-06 DIAGNOSIS — M5126 Other intervertebral disc displacement, lumbar region: Secondary | ICD-10-CM | POA: Diagnosis not present

## 2023-01-06 DIAGNOSIS — M5116 Intervertebral disc disorders with radiculopathy, lumbar region: Secondary | ICD-10-CM | POA: Diagnosis not present

## 2023-01-25 DIAGNOSIS — M5451 Vertebrogenic low back pain: Secondary | ICD-10-CM | POA: Diagnosis not present

## 2023-02-02 DIAGNOSIS — M5416 Radiculopathy, lumbar region: Secondary | ICD-10-CM | POA: Diagnosis not present

## 2023-02-07 DIAGNOSIS — N952 Postmenopausal atrophic vaginitis: Secondary | ICD-10-CM | POA: Diagnosis not present

## 2023-02-07 DIAGNOSIS — R159 Full incontinence of feces: Secondary | ICD-10-CM | POA: Diagnosis not present

## 2023-02-07 DIAGNOSIS — N3281 Overactive bladder: Secondary | ICD-10-CM | POA: Diagnosis not present

## 2023-02-07 DIAGNOSIS — N816 Rectocele: Secondary | ICD-10-CM | POA: Diagnosis not present

## 2023-02-07 DIAGNOSIS — R3915 Urgency of urination: Secondary | ICD-10-CM | POA: Diagnosis not present

## 2023-02-16 DIAGNOSIS — M5417 Radiculopathy, lumbosacral region: Secondary | ICD-10-CM | POA: Diagnosis not present

## 2023-02-16 DIAGNOSIS — M5116 Intervertebral disc disorders with radiculopathy, lumbar region: Secondary | ICD-10-CM | POA: Diagnosis not present

## 2023-02-16 DIAGNOSIS — M5416 Radiculopathy, lumbar region: Secondary | ICD-10-CM | POA: Diagnosis not present

## 2023-02-16 DIAGNOSIS — M5451 Vertebrogenic low back pain: Secondary | ICD-10-CM | POA: Diagnosis not present

## 2023-02-17 ENCOUNTER — Other Ambulatory Visit: Payer: Self-pay | Admitting: Adult Health

## 2023-02-17 DIAGNOSIS — F411 Generalized anxiety disorder: Secondary | ICD-10-CM

## 2023-02-17 DIAGNOSIS — M5416 Radiculopathy, lumbar region: Secondary | ICD-10-CM | POA: Diagnosis not present

## 2023-02-17 DIAGNOSIS — F3181 Bipolar II disorder: Secondary | ICD-10-CM

## 2023-02-27 DIAGNOSIS — M5416 Radiculopathy, lumbar region: Secondary | ICD-10-CM | POA: Diagnosis not present

## 2023-02-28 DIAGNOSIS — L258 Unspecified contact dermatitis due to other agents: Secondary | ICD-10-CM | POA: Diagnosis not present

## 2023-02-28 DIAGNOSIS — C44519 Basal cell carcinoma of skin of other part of trunk: Secondary | ICD-10-CM | POA: Diagnosis not present

## 2023-03-01 DIAGNOSIS — M5416 Radiculopathy, lumbar region: Secondary | ICD-10-CM | POA: Diagnosis not present

## 2023-03-06 DIAGNOSIS — M5416 Radiculopathy, lumbar region: Secondary | ICD-10-CM | POA: Diagnosis not present

## 2023-04-11 DIAGNOSIS — K641 Second degree hemorrhoids: Secondary | ICD-10-CM | POA: Diagnosis not present

## 2023-04-27 ENCOUNTER — Other Ambulatory Visit: Payer: Self-pay

## 2023-04-27 DIAGNOSIS — F3181 Bipolar II disorder: Secondary | ICD-10-CM

## 2023-04-27 MED ORDER — LAMOTRIGINE 100 MG PO TABS: 100.0000 mg | ORAL_TABLET | Freq: Every day | ORAL | 0 refills | Status: DC

## 2023-06-06 DIAGNOSIS — R159 Full incontinence of feces: Secondary | ICD-10-CM | POA: Diagnosis not present

## 2023-06-06 DIAGNOSIS — N958 Other specified menopausal and perimenopausal disorders: Secondary | ICD-10-CM | POA: Diagnosis not present

## 2023-06-06 DIAGNOSIS — N3281 Overactive bladder: Secondary | ICD-10-CM | POA: Diagnosis not present

## 2023-06-16 DIAGNOSIS — Z4889 Encounter for other specified surgical aftercare: Secondary | ICD-10-CM | POA: Diagnosis not present

## 2023-06-19 ENCOUNTER — Other Ambulatory Visit: Payer: Self-pay | Admitting: Adult Health

## 2023-06-19 ENCOUNTER — Telehealth: Payer: Self-pay | Admitting: Adult Health

## 2023-06-19 DIAGNOSIS — F411 Generalized anxiety disorder: Secondary | ICD-10-CM

## 2023-06-19 DIAGNOSIS — F3181 Bipolar II disorder: Secondary | ICD-10-CM

## 2023-06-19 DIAGNOSIS — G4700 Insomnia, unspecified: Secondary | ICD-10-CM

## 2023-06-19 DIAGNOSIS — F900 Attention-deficit hyperactivity disorder, predominantly inattentive type: Secondary | ICD-10-CM

## 2023-06-19 MED ORDER — AMPHETAMINE-DEXTROAMPHETAMINE 10 MG PO TABS: 10.0000 mg | ORAL_TABLET | Freq: Every day | ORAL | 0 refills | Status: DC

## 2023-06-19 MED ORDER — AMPHETAMINE-DEXTROAMPHET ER 30 MG PO CP24: 30.0000 mg | ORAL_CAPSULE | Freq: Every day | ORAL | 0 refills | Status: DC

## 2023-06-19 MED ORDER — ZOLPIDEM TARTRATE 10 MG PO TABS: ORAL_TABLET | ORAL | 2 refills | Status: DC

## 2023-06-19 NOTE — Telephone Encounter (Signed)
Script sent  

## 2023-06-19 NOTE — Telephone Encounter (Signed)
Patient lvm at 11:48 requesting refill for Adderall XR 30 Adderall 10mg  and Ambien 10mg . Pharmacy has in stock. Please send refills to [facility] [address] [place] Ph: [phone]

## 2023-06-28 DIAGNOSIS — M79604 Pain in right leg: Secondary | ICD-10-CM | POA: Diagnosis not present

## 2023-06-28 DIAGNOSIS — M545 Low back pain, unspecified: Secondary | ICD-10-CM | POA: Diagnosis not present

## 2023-06-28 DIAGNOSIS — R269 Unspecified abnormalities of gait and mobility: Secondary | ICD-10-CM | POA: Diagnosis not present

## 2023-07-24 DIAGNOSIS — G4733 Obstructive sleep apnea (adult) (pediatric): Secondary | ICD-10-CM | POA: Diagnosis not present

## 2023-07-24 DIAGNOSIS — J3089 Other allergic rhinitis: Secondary | ICD-10-CM | POA: Diagnosis not present

## 2023-07-24 DIAGNOSIS — F1190 Opioid use, unspecified, uncomplicated: Secondary | ICD-10-CM | POA: Diagnosis not present

## 2023-07-24 DIAGNOSIS — I10 Essential (primary) hypertension: Secondary | ICD-10-CM | POA: Diagnosis not present

## 2023-07-24 DIAGNOSIS — F5104 Psychophysiologic insomnia: Secondary | ICD-10-CM | POA: Diagnosis not present

## 2023-07-31 ENCOUNTER — Encounter: Payer: Self-pay | Admitting: Adult Health

## 2023-07-31 ENCOUNTER — Ambulatory Visit: Payer: BC Managed Care – PPO | Admitting: Adult Health

## 2023-07-31 DIAGNOSIS — F411 Generalized anxiety disorder: Secondary | ICD-10-CM | POA: Diagnosis not present

## 2023-07-31 DIAGNOSIS — F900 Attention-deficit hyperactivity disorder, predominantly inattentive type: Secondary | ICD-10-CM | POA: Diagnosis not present

## 2023-07-31 DIAGNOSIS — G4700 Insomnia, unspecified: Secondary | ICD-10-CM | POA: Diagnosis not present

## 2023-07-31 DIAGNOSIS — F3181 Bipolar II disorder: Secondary | ICD-10-CM | POA: Diagnosis not present

## 2023-07-31 MED ORDER — LAMOTRIGINE 150 MG PO TABS
150.0000 mg | ORAL_TABLET | Freq: Every day | ORAL | 5 refills | Status: DC
Start: 2023-07-31 — End: 2024-02-06

## 2023-07-31 MED ORDER — BUPROPION HCL ER (XL) 150 MG PO TB24
450.0000 mg | ORAL_TABLET | Freq: Every morning | ORAL | 5 refills | Status: DC
Start: 2023-07-31 — End: 2024-02-06

## 2023-07-31 MED ORDER — AMPHETAMINE-DEXTROAMPHET ER 30 MG PO CP24: 30.0000 mg | ORAL_CAPSULE | Freq: Every day | ORAL | 0 refills | Status: DC

## 2023-07-31 MED ORDER — AMPHETAMINE-DEXTROAMPHETAMINE 10 MG PO TABS
10.0000 mg | ORAL_TABLET | Freq: Every day | ORAL | 0 refills | Status: DC
Start: 2023-08-28 — End: 2024-02-20

## 2023-07-31 MED ORDER — ALPRAZOLAM 1 MG PO TABS
ORAL_TABLET | ORAL | 1 refills | Status: DC
Start: 2023-07-31 — End: 2024-02-06

## 2023-07-31 MED ORDER — AMPHETAMINE-DEXTROAMPHETAMINE 10 MG PO TABS
10.0000 mg | ORAL_TABLET | Freq: Every day | ORAL | 0 refills | Status: DC
Start: 2023-09-25 — End: 2024-02-20

## 2023-07-31 MED ORDER — TRAZODONE HCL 50 MG PO TABS: 50.0000 mg | ORAL_TABLET | Freq: Every day | ORAL | 5 refills | Status: DC

## 2023-07-31 MED ORDER — AMPHETAMINE-DEXTROAMPHETAMINE 10 MG PO TABS
10.0000 mg | ORAL_TABLET | Freq: Every day | ORAL | 0 refills | Status: DC
Start: 2023-07-31 — End: 2024-02-06

## 2023-07-31 MED ORDER — ZOLPIDEM TARTRATE 10 MG PO TABS
ORAL_TABLET | ORAL | 2 refills | Status: DC
Start: 2023-07-31 — End: 2024-02-06

## 2023-07-31 NOTE — Progress Notes (Signed)
PEARL CLAMP [id number] 01-22-1961 62 y.o.  Subjective:   Patient ID:  Tammy Randolph is a 62 y.o. (DOB 1961-11-08) female.  Chief Complaint: No chief complaint on file.   HPI Tammy Randolph presents to the office today for follow-up of GAD, insomnia, BPD-2, and ADHD.  Describes mood today as "ok". Pleasant. Denies tearfulness. Mood symptoms - reports some depression, anxiety, and irritability and anxiety. Denies panic attacks. Reports some worry, rumination, and over thinking. Mood is lower - increased situational stressors. Stating "I haven't been feeling good". Feels like medications are helpful. She and husband are doing well. Upcoming trip to [place]. Stable interest and motivation. Involved in community services. Taking medications as prescribed.  Energy levels stable. Active, does not have a regular exercise routine.  Enjoys some usual interests and activities. Married. Lives with husband. Children local. Spending time with family and friends. Appetite adequate. Weight loss - 152 from 159 pounds. Sleeping better some nights than others. Averages - 3 hours. Reports some daytime napping.  Focus and concentration stable. Completing tasks. Managing aspects of household. Has been caring for family members. Denies SI or HI.  Denies AH or VH. Denies self harm. Denies substance use.   Previous medication trials: Celexa, Lexapro, Effexor in addition to Viibryd, Wellbutrin, Lamictal last year, Cymbalta 30 mg    Review of Systems:  Review of Systems  Musculoskeletal:  Negative for gait problem.  Neurological:  Negative for tremors.  Psychiatric/Behavioral:         Please refer to HPI    Medications: I have reviewed the patient's current medications.  Current Outpatient Medications  Medication Sig Dispense Refill   albuterol (PROVENTIL HFA;VENTOLIN HFA) 108 (90 BASE) MCG/ACT inhaler Inhale 1-2 puffs into the lungs every 6 (six) hours as needed for wheezing or shortness of breath.      ALPRAZolam (XANAX) 1 MG tablet TAKE 1 TABLET BY MOUTH FOUR TIMES DAILY 120 tablet 1   amphetamine-dextroamphetamine (ADDERALL XR) 30 MG 24 hr capsule Take 1 capsule (30 mg total) by mouth daily after breakfast. 30 capsule 0   amphetamine-dextroamphetamine (ADDERALL XR) 30 MG 24 hr capsule Take 1 capsule (30 mg total) by mouth daily. 30 capsule 0   amphetamine-dextroamphetamine (ADDERALL XR) 30 MG 24 hr capsule Take 1 capsule (30 mg total) by mouth daily. 30 capsule 0   amphetamine-dextroamphetamine (ADDERALL) 10 MG tablet Take 1 tablet (10 mg total) by mouth daily. 30 tablet 0   amphetamine-dextroamphetamine (ADDERALL) 10 MG tablet Take 1 tablet (10 mg total) by mouth daily. 30 tablet 0   amphetamine-dextroamphetamine (ADDERALL) 10 MG tablet Take 1 tablet (10 mg total) by mouth daily. 30 tablet 0   baclofen (LIORESAL) 10 MG tablet 10 mg daily.     buPROPion (WELLBUTRIN XL) 150 MG 24 hr tablet Take 3 tablets (450 mg total) by mouth every morning. 90 tablet 5   carvedilol (COREG) 12.5 MG tablet Take 1 tablet (12.5 mg total) by mouth 2 (two) times daily with a meal. 270 tablet 3   estradiol (VIVELLE-DOT) 0.05 MG/24HR patch 1 patch 2 (two) times a week.     estradiol (VIVELLE-DOT) 0.05 MG/24HR patch Place onto the skin.     lamoTRIgine (LAMICTAL) 100 MG tablet Take 1 tablet (100 mg total) by mouth daily. 30 tablet 0   lamoTRIgine (LAMICTAL) 25 MG tablet lamotrigine 25 mg tablet     Misc. Devices MISC Apply 1 gram to the vestibule daily (Estradiol 0.01% with Testosterone 0.1%)  MYRBETRIQ 50 MG TB24 tablet Take 50 mg by mouth daily.     ondansetron (ZOFRAN) 4 MG tablet Take 1 tablet (4 mg total) by mouth every 8 (eight) hours as needed for nausea or vomiting. 20 tablet 0   oxyCODONE-acetaminophen (PERCOCET) 10-325 MG tablet Take 1 tablet by mouth every 4 (four) hours as needed for pain. 60 tablet 0   pseudoephedrine-acetaminophen (TYLENOL SINUS) 30-500 MG TABS Take 1 tablet by mouth every 4 (four)  hours as needed (Seasonal Allergies).     spironolactone (ALDACTONE) 25 MG tablet Take 1 tablet (25 mg total) by mouth daily. Pt needs to make appt with provider for further refills - 2nd attempt 15 tablet 0   SUMAtriptan (IMITREX) 50 MG tablet as directed. TAKE 1 TABLET BY MOUTH AS NEEDED     testosterone (ANDROGEL) 50 MG/5GM (1%) GEL SMARTSIG:Topical     zolpidem (AMBIEN) 10 MG tablet Take one tablet by mouth every night at bedtime 30 tablet 2   No current facility-administered medications for this visit.    Medication Side Effects: None  Allergies:  Allergies  Allergen Reactions   Hydrocodone-Acetaminophen Itching and Rash   Morphine Other (See Comments)    Pt did not like the way it made her feel    Past Medical History:  Diagnosis Date   Adenomatous colon polyp 1996   Anxiety    ANXIETY 07/15/2008   Qualifier: Diagnosis of  By: Yetta Barre CMA, Chemira     Arthritis    Asthma    Bipolar disorder (HCC)    Chronic fatigue    Depression    History of blood transfusion 2003   Hypertension    Pneumonia    couple of times last being last year   PONV (postoperative nausea and vomiting)    Seasonal allergies    Spinal headache     Past Medical History, Surgical history, Social history, and Family history were reviewed and updated as appropriate.   Please see review of systems for further details on the patient's review from today.   Objective:   Physical Exam:  There were no vitals taken for this visit.  Physical Exam Constitutional:      General: She is not in acute distress. Musculoskeletal:        General: No deformity.  Neurological:     Mental Status: She is alert and oriented to person, place, and time.     Coordination: Coordination normal.  Psychiatric:        Attention and Perception: Attention and perception normal. She does not perceive auditory or visual hallucinations.        Mood and Affect: Affect is not labile, blunt, angry or inappropriate.         Speech: Speech normal.        Behavior: Behavior normal.        Thought Content: Thought content normal. Thought content is not paranoid or delusional. Thought content does not include homicidal or suicidal ideation. Thought content does not include homicidal or suicidal plan.        Cognition and Memory: Cognition and memory normal.        Judgment: Judgment normal.     Comments: Insight intact     Lab Review:     Component Value Date/Time   NA 137 09/06/2020 1858   NA 138 01/17/2018 0846   K 4.0 09/06/2020 1858   CL 97 (L) 09/06/2020 1858   CO2 29 09/06/2020 1858   GLUCOSE 94 09/06/2020 1858  BUN 15 09/06/2020 1858   BUN 11 01/17/2018 0846   CREATININE 0.85 09/06/2020 1858   CALCIUM 9.8 09/06/2020 1858   PROT 8.1 09/06/2020 1858   ALBUMIN 4.7 09/06/2020 1858   AST 23 09/06/2020 1858   ALT 19 09/06/2020 1858   ALKPHOS 92 09/06/2020 1858   BILITOT 0.4 09/06/2020 1858   GFRNONAA >60 09/06/2020 1858   GFRAA >60 09/06/2020 1858       Component Value Date/Time   WBC 6.8 09/06/2020 1858   RBC 5.11 09/06/2020 1858   HGB 15.4 (H) 09/06/2020 1858   HGB 13.7 02/02/2011 1448   HCT 46.0 09/06/2020 1858   HCT 39.3 02/02/2011 1448   PLT 262 09/06/2020 1858   PLT 264 02/02/2011 1448   MCV 90.0 09/06/2020 1858   MCV 89.3 02/02/2011 1448   MCH 30.1 09/06/2020 1858   MCHC 33.5 09/06/2020 1858   RDW 12.2 09/06/2020 1858   RDW 12.3 02/02/2011 1448   LYMPHSABS 2.4 09/06/2020 1858   LYMPHSABS 2.0 02/02/2011 1448   MONOABS 0.7 09/06/2020 1858   MONOABS 0.5 02/02/2011 1448   EOSABS 0.4 09/06/2020 1858   EOSABS 0.2 02/02/2011 1448   BASOSABS 0.1 09/06/2020 1858   BASOSABS 0.0 02/02/2011 1448    No results found for: "POCLITH", "LITHIUM"   No results found for: "PHENYTOIN", "PHENOBARB", "VALPROATE", "CBMZ"   .res Assessment: Plan:    Plan:  PDMP reviewed  1. Wellbutrin 150 mg XL taking 3 tablets total 450 mg every morning 2. Adderall 30 mg - XR daily in the morning - not  taking every day 3. Ambien 10mg  at hs - taking some nights 4. Xanax 1mg  - 4 x daily - taking some days 5. Adderall 10mg  - 1/2 to 1 tablet daily as needed - not taking every day  RTC 3 months  Patient advised to contact office with any questions, adverse effects, or acute worsening in signs and symptoms.  Discussed potential benefits, risk, and side effects of benzodiazepines to include potential risk of tolerance and dependence, as well as possible drowsiness.  Advised patient not to drive if experiencing drowsiness and to take lowest possible effective dose to minimize risk of dependence and tolerance.  Discussed potential benefits, risks, and side effects of stimulants with patient to include increased heart rate, palpitations, insomnia, increased anxiety, increased irritability, or decreased appetite.  Instructed patient to contact office if experiencing any significant tolerability issues.  Counseled patient regarding potential benefits, risks, and side effects of Lamictal to include potential risk of Stevens-Johnson syndrome. Advised patient to stop taking Lamictal and contact office immediately if rash develops and to seek urgent medical attention if rash is severe and/or spreading quickly.  There are no diagnoses linked to this encounter.   Please see After Visit Summary for patient specific instructions.  Future Appointments  Date Time Provider Department Center  07/31/2023 10:00 AM Nahiara Kretzschmar, Thereasa Solo, NP [facility] None    No orders of the defined types were placed in this encounter.   -------------------------------

## 2023-08-29 DIAGNOSIS — G4733 Obstructive sleep apnea (adult) (pediatric): Secondary | ICD-10-CM | POA: Diagnosis not present

## 2023-08-29 DIAGNOSIS — M25522 Pain in left elbow: Secondary | ICD-10-CM | POA: Diagnosis not present

## 2023-09-19 DIAGNOSIS — M25522 Pain in left elbow: Secondary | ICD-10-CM | POA: Diagnosis not present

## 2023-09-28 DIAGNOSIS — G4733 Obstructive sleep apnea (adult) (pediatric): Secondary | ICD-10-CM | POA: Diagnosis not present

## 2023-10-29 DIAGNOSIS — G4733 Obstructive sleep apnea (adult) (pediatric): Secondary | ICD-10-CM | POA: Diagnosis not present

## 2023-11-28 DIAGNOSIS — G4733 Obstructive sleep apnea (adult) (pediatric): Secondary | ICD-10-CM | POA: Diagnosis not present

## 2023-12-16 DIAGNOSIS — R051 Acute cough: Secondary | ICD-10-CM | POA: Diagnosis not present

## 2023-12-16 DIAGNOSIS — J209 Acute bronchitis, unspecified: Secondary | ICD-10-CM | POA: Diagnosis not present

## 2023-12-16 DIAGNOSIS — J019 Acute sinusitis, unspecified: Secondary | ICD-10-CM | POA: Diagnosis not present

## 2023-12-29 DIAGNOSIS — G4733 Obstructive sleep apnea (adult) (pediatric): Secondary | ICD-10-CM | POA: Diagnosis not present

## 2024-01-02 ENCOUNTER — Telehealth: Payer: Self-pay | Admitting: Adult Health

## 2024-01-02 ENCOUNTER — Other Ambulatory Visit: Payer: Self-pay

## 2024-01-02 DIAGNOSIS — F900 Attention-deficit hyperactivity disorder, predominantly inattentive type: Secondary | ICD-10-CM

## 2024-01-02 MED ORDER — AMPHETAMINE-DEXTROAMPHET ER 30 MG PO CP24
30.0000 mg | ORAL_CAPSULE | Freq: Every day | ORAL | 0 refills | Status: DC
Start: 2024-01-02 — End: 2024-03-04

## 2024-01-02 NOTE — Telephone Encounter (Signed)
 Pended enough 30 ER to get to appt. Last filled 10 mg 12/26.

## 2024-01-02 NOTE — Telephone Encounter (Signed)
 Pt called and made an appt for 01/09/24 She needs a refill on her adderall xr 30 mg and adderall 10 mg. Pharmacy is [facility] on [address] in [place]

## 2024-01-09 ENCOUNTER — Ambulatory Visit: Payer: BC Managed Care – PPO | Admitting: Adult Health

## 2024-01-09 NOTE — Progress Notes (Unsigned)
 Tammy Randolph [id number] 1961-05-18 63 y.o.  Subjective:   Patient ID:  Tammy Randolph is a 63 y.o. (DOB [birth year]) female.  Chief Complaint: No chief complaint on file.   HPI Tammy Randolph presents to the office today for follow-up of ***      Review of Systems:  Review of Systems  Medications: {medication reviewed/display:3041432}  Current Outpatient Medications  Medication Sig Dispense Refill   albuterol  (PROVENTIL  HFA;VENTOLIN  HFA) 108 (90 BASE) MCG/ACT inhaler Inhale 1-2 puffs into the lungs every 6 (six) hours as needed for wheezing or shortness of breath.     ALPRAZolam  (XANAX ) 1 MG tablet TAKE 1 TABLET BY MOUTH FOUR TIMES DAILY 120 tablet 1   amphetamine -dextroamphetamine  (ADDERALL XR) 30 MG 24 hr capsule Take 1 capsule (30 mg total) by mouth daily after breakfast. 30 capsule 0   amphetamine -dextroamphetamine  (ADDERALL XR) 30 MG 24 hr capsule Take 1 capsule (30 mg total) by mouth daily. 30 capsule 0   amphetamine -dextroamphetamine  (ADDERALL XR) 30 MG 24 hr capsule Take 1 capsule (30 mg total) by mouth daily. 30 capsule 0   amphetamine -dextroamphetamine  (ADDERALL) 10 MG tablet Take 1 tablet (10 mg total) by mouth daily. 30 tablet 0   amphetamine -dextroamphetamine  (ADDERALL) 10 MG tablet Take 1 tablet (10 mg total) by mouth daily. 30 tablet 0   amphetamine -dextroamphetamine  (ADDERALL) 10 MG tablet Take 1 tablet (10 mg total) by mouth daily. 30 tablet 0   baclofen (LIORESAL) 10 MG tablet 10 mg daily.     buPROPion  (WELLBUTRIN  XL) 150 MG 24 hr tablet Take 3 tablets (450 mg total) by mouth every morning. 90 tablet 5   carvedilol  (COREG ) 12.5 MG tablet Take 1 tablet (12.5 mg total) by mouth 2 (two) times daily with a meal. 270 tablet 3   estradiol  (VIVELLE -DOT) 0.05 MG/24HR patch 1 patch 2 (two) times a week.     estradiol  (VIVELLE -DOT) 0.05 MG/24HR patch Place onto the skin.     lamoTRIgine  (LAMICTAL ) 150 MG tablet Take 1 tablet (150 mg total) by mouth daily. 30 tablet 5    Misc. Devices MISC Apply 1 gram to the vestibule daily (Estradiol  0.01% with Testosterone  0.1%)     MYRBETRIQ 50 MG TB24 tablet Take 50 mg by mouth daily.     ondansetron  (ZOFRAN ) 4 MG tablet Take 1 tablet (4 mg total) by mouth every 8 (eight) hours as needed for nausea or vomiting. 20 tablet 0   oxyCODONE -acetaminophen  (PERCOCET) 10-325 MG tablet Take 1 tablet by mouth every 4 (four) hours as needed for pain. 60 tablet 0   pseudoephedrine-acetaminophen  (TYLENOL  SINUS) 30-500 MG TABS Take 1 tablet by mouth every 4 (four) hours as needed (Seasonal Allergies).     spironolactone  (ALDACTONE ) 25 MG tablet Take 1 tablet (25 mg total) by mouth daily. Pt needs to make appt with provider for further refills - 2nd attempt 15 tablet 0   SUMAtriptan  (IMITREX ) 50 MG tablet as directed. TAKE 1 TABLET BY MOUTH AS NEEDED     testosterone  (ANDROGEL ) 50 MG/5GM (1%) GEL SMARTSIG:Topical     traZODone  (DESYREL ) 50 MG tablet Take 1 tablet (50 mg total) by mouth at bedtime. 30 tablet 5   zolpidem  (AMBIEN ) 10 MG tablet Take one tablet by mouth every night at bedtime 30 tablet 2   No current facility-administered medications for this visit.    Medication Side Effects: {Medication Side Effects (Optional):21014029}  Allergies:  Allergies  Allergen Reactions   Hydrocodone-Acetaminophen  Itching and Rash   Morphine Other (See  Comments)    Pt did not like the way it made her feel    Past Medical History:  Diagnosis Date   Adenomatous colon polyp 1996   Anxiety    ANXIETY 07/15/2008   Qualifier: Diagnosis of  By: Joshua CMA, Chemira     Arthritis    Asthma    Bipolar disorder (HCC)    Chronic fatigue    Depression    History of blood transfusion 2003   Hypertension    Pneumonia    couple of times last being last year   PONV (postoperative nausea and vomiting)    Seasonal allergies    Spinal headache     Past Medical History, Surgical history, Social history, and Family history were reviewed and  updated as appropriate.   Please see review of systems for further details on the patient's review from today.   Objective:   Physical Exam:  There were no vitals taken for this visit.  Physical Exam  Lab Review:     Component Value Date/Time   NA 137 09/06/2020 1858   NA 138 01/17/2018 0846   K 4.0 09/06/2020 1858   CL 97 (L) 09/06/2020 1858   CO2 29 09/06/2020 1858   GLUCOSE 94 09/06/2020 1858   BUN 15 09/06/2020 1858   BUN 11 01/17/2018 0846   CREATININE 0.85 09/06/2020 1858   CALCIUM 9.8 09/06/2020 1858   PROT 8.1 09/06/2020 1858   ALBUMIN 4.7 09/06/2020 1858   AST 23 09/06/2020 1858   ALT 19 09/06/2020 1858   ALKPHOS 92 09/06/2020 1858   BILITOT 0.4 09/06/2020 1858   GFRNONAA >60 09/06/2020 1858   GFRAA >60 09/06/2020 1858       Component Value Date/Time   WBC 6.8 09/06/2020 1858   RBC 5.11 09/06/2020 1858   HGB 15.4 (H) 09/06/2020 1858   HGB 13.7 02/02/2011 1448   HCT 46.0 09/06/2020 1858   HCT 39.3 02/02/2011 1448   PLT 262 09/06/2020 1858   PLT 264 02/02/2011 1448   MCV 90.0 09/06/2020 1858   MCV 89.3 02/02/2011 1448   MCH 30.1 09/06/2020 1858   MCHC 33.5 09/06/2020 1858   RDW 12.2 09/06/2020 1858   RDW 12.3 02/02/2011 1448   LYMPHSABS 2.4 09/06/2020 1858   LYMPHSABS 2.0 02/02/2011 1448   MONOABS 0.7 09/06/2020 1858   MONOABS 0.5 02/02/2011 1448   EOSABS 0.4 09/06/2020 1858   EOSABS 0.2 02/02/2011 1448   BASOSABS 0.1 09/06/2020 1858   BASOSABS 0.0 02/02/2011 1448    No results found for: POCLITH, LITHIUM   No results found for: PHENYTOIN, PHENOBARB, VALPROATE, CBMZ   .res Assessment: Plan:    There are no diagnoses linked to this encounter.   Please see After Visit Summary for patient specific instructions.  Future Appointments  Date Time Provider Department Center  01/09/2024  9:00 AM Stephenson Cichy Nattalie, NP [facility] None    No orders of the defined types were placed in this  encounter.   -------------------------------

## 2024-01-29 DIAGNOSIS — G4733 Obstructive sleep apnea (adult) (pediatric): Secondary | ICD-10-CM | POA: Diagnosis not present

## 2024-02-01 NOTE — Progress Notes (Signed)
 Chief Complaint: Rectal bleeding Primary GI Doctor: (Previously Dr. Aneita) Dr. [name]   HPI: Patient is a 63 year old female patient, previously known to Dr. Aneita, with past medical history of anxiety, asthma, bipolar, depression, hypertension, who presents for complaint of rectal bleeding.  04/11/23 Patient has seen colon rectal surgeon Dr. 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 He recommended only doing operation if she was highly symptomatic.  Interval History    Patient presents to office for colon screening surveillance and enquires about treatment options for internal/external hemorrhoids. Patient reports intermittent issues with constipation. She is currently taking Metamucil once daily. She also has some gas. She has some rectal itching. She has hard time keeping area clean with outside hemorrhoids/skin tags. Occasional blood with wiping. Patient denies GERD or dysphagia. Patient denies nausea, vomiting, or weight loss. Social alcohol use. Nonsmoker. Patient's family history includes maternal GM with colon Ca, mother and siblings with colonic polyps.  Wt Readings from Last 3 Encounters:  02/02/24 157 lb (71.2 kg)  10/26/21 155 lb 3.2 oz (70.4 kg)  01/27/21 160 lb (72.6 kg)    Past Medical History:  Diagnosis Date   Adenomatous colon polyp 1996   Anxiety    ANXIETY 07/15/2008   Qualifier: Diagnosis of  By: Joshua CMA, Chemira     Arthritis    Asthma    Bipolar disorder (HCC)    Chronic fatigue    Depression    History of blood transfusion 2003   Hypertension    Pneumonia    couple of times last being last year   PONV (postoperative nausea and vomiting)    Seasonal allergies    Spinal headache    Past Surgical History:  Procedure Laterality Date   ABDOMINAL HYSTERECTOMY  2004   and USO for endometriosis   ANTERIOR CERVICAL DECOMP/DISCECTOMY FUSION N/A 09/23/2015   Procedure: ANTERIOR CERVICAL DISCECTOMY FUSION C4-7      (3 LEVELS);  Surgeon: Donaciano Sprang, MD;  Location: [facility] OR;  Service: Orthopedics;  Laterality: N/A;   APPENDECTOMY  1988   BREAST ENHANCEMENT SURGERY Bilateral 2008   COLONOSCOPY W/ POLYPECTOMY  2013   Dr Aneita   HYSTERECTOMY ABDOMINAL WITH SALPINGECTOMY     x4 for endometriosis   KNEE ARTHROSCOPY WITH ANTERIOR CRUCIATE LIGAMENT (ACL) REPAIR Right 01/26/2013   LUMBAR LAMINECTOMY  2008   TONSILLECTOMY  2009    Current Outpatient Medications  Medication Sig Dispense Refill   albuterol  (PROVENTIL  HFA;VENTOLIN  HFA) 108 (90 BASE) MCG/ACT inhaler Inhale 1-2 puffs into the lungs every 6 (six) hours as needed for wheezing or shortness of breath.     ALPRAZolam  (XANAX ) 1 MG tablet TAKE 1 TABLET BY MOUTH FOUR TIMES DAILY 120 tablet 1   amphetamine -dextroamphetamine  (ADDERALL XR) 30 MG 24 hr capsule Take 1 capsule (30 mg total) by mouth daily. 30 capsule 0   amphetamine -dextroamphetamine  (ADDERALL XR) 30 MG 24 hr capsule Take 1 capsule (30 mg total) by mouth daily. 30 capsule 0   amphetamine -dextroamphetamine  (ADDERALL) 10 MG tablet Take 1 tablet (10 mg total) by mouth daily. 30 tablet 0   amphetamine -dextroamphetamine  (ADDERALL) 10 MG tablet Take 1 tablet (10 mg total) by mouth daily. 30 tablet 0   amphetamine -dextroamphetamine  (ADDERALL) 10 MG tablet Take 1 tablet (10 mg total) by mouth daily. 30 tablet 0   baclofen (LIORESAL) 10 MG tablet 10 mg daily.     buPROPion  (WELLBUTRIN  XL) 150 MG 24 hr tablet Take 3 tablets (450  mg total) by mouth every morning. 90 tablet 5   carvedilol  (COREG ) 12.5 MG tablet Take 1 tablet (12.5 mg total) by mouth 2 (two) times daily with a meal. 270 tablet 3   estradiol  (VIVELLE -DOT) 0.05 MG/24HR patch 1 patch 2 (two) times a week.     estradiol  (VIVELLE -DOT) 0.05 MG/24HR patch Place onto the skin.     lamoTRIgine  (LAMICTAL ) 150 MG tablet Take 1 tablet (150 mg total) by mouth daily. 30 tablet 5   Misc. Devices MISC Apply 1 gram to the vestibule daily (Estradiol  0.01%  with Testosterone  0.1%)     MYRBETRIQ 50 MG TB24 tablet Take 50 mg by mouth daily.     ondansetron  (ZOFRAN ) 4 MG tablet Take 1 tablet (4 mg total) by mouth every 8 (eight) hours as needed for nausea or vomiting. 20 tablet 0   oxyCODONE -acetaminophen  (PERCOCET) 10-325 MG tablet Take 1 tablet by mouth every 4 (four) hours as needed for pain. 60 tablet 0   pseudoephedrine-acetaminophen  (TYLENOL  SINUS) 30-500 MG TABS Take 1 tablet by mouth every 4 (four) hours as needed (Seasonal Allergies).     spironolactone  (ALDACTONE ) 25 MG tablet Take 1 tablet (25 mg total) by mouth daily. Pt needs to make appt with provider for further refills - 2nd attempt 15 tablet 0   SUMAtriptan  (IMITREX ) 50 MG tablet as directed. TAKE 1 TABLET BY MOUTH AS NEEDED     testosterone  (ANDROGEL ) 50 MG/5GM (1%) GEL SMARTSIG:Topical     traZODone  (DESYREL ) 50 MG tablet Take 1 tablet (50 mg total) by mouth at bedtime. 30 tablet 5   zolpidem  (AMBIEN ) 10 MG tablet Take one tablet by mouth every night at bedtime 30 tablet 2   No current facility-administered medications for this visit.    Allergies as of 02/02/2024 - Review Complete 02/02/2024  Allergen Reaction Noted   Hydrocodone-acetaminophen  Itching and Rash 09/22/2009   Morphine Other (See Comments) 09/22/2009    Family History  Problem Relation Age of Onset   Colon cancer Brother 70   Colon polyps Brother    Stomach cancer Maternal Uncle 67   Colon cancer Paternal Uncle 28   Colon cancer Maternal Grandmother 50   Sleep apnea Father    CVA Father    Sleep apnea Sister    Hypertension Sister    Coronary artery disease Paternal Aunt    Pancreatic cancer Paternal Uncle    CVA Mother        x3   Hypertension Mother    Arthritis Mother    Breast cancer Mother    Heart Problems Mother        pacemaker   Heart attack Maternal Uncle    Clotting disorder Brother    Hypertension Sister     Review of Systems:    Constitutional: No weight loss, fever, chills,  weakness or fatigue HEENT: Eyes: No change in vision               Ears, Nose, Throat:  No change in hearing or congestion Skin: No rash or itching Cardiovascular: No chest pain, chest pressure or palpitations   Respiratory: No SOB or cough Gastrointestinal: See HPI and otherwise negative Genitourinary: No dysuria or change in urinary frequency Neurological: No headache, dizziness or syncope Musculoskeletal: No new muscle or joint pain Hematologic: No bleeding or bruising Psychiatric: No history of depression or anxiety    Physical Exam:  Vital signs: BP 120/70 (BP Location: Left Arm, Patient Position: Sitting, Cuff Size: Normal)   Pulse  88   Ht 5' 5 (1.651 m)   Wt 157 lb (71.2 kg)   SpO2 96%   BMI 26.13 kg/m   Constitutional:   Pleasant Caucasian female appears to be in NAD, Well developed, Well nourished, alert and cooperative Neck:  Supple Throat: Oral cavity and pharynx without inflammation, swelling or lesion.  Respiratory: Respirations even and unlabored. Lungs clear to auscultation bilaterally.   No wheezes, crackles, or rhonchi.  Cardiovascular: Normal S1, S2. Regular rate and rhythm. No peripheral edema, cyanosis or pallor.  Gastrointestinal:  Soft, nondistended, nontender. No rebound or guarding. Normal bowel sounds. No appreciable masses or hepatomegaly. Rectal:  Not performed. Declined.  Skin:   Dry and intact without significant lesions or rashes. Psychiatric: Oriented to person, place and time. Demonstrates good judgement and reason without abnormal affect or behaviors.  RELEVANT LABS AND IMAGING: CBC    Latest Ref Rng & Units 09/06/2020    6:58 PM 05/23/2017    3:02 PM 09/21/2015   11:34 AM  CBC  WBC 4.0 - 10.5 K/uL 6.8  5.4  8.7   Hemoglobin 12.0 - 15.0 g/dL 84.5  85.5  85.6   Hematocrit 36.0 - 46.0 % 46.0  43.0  42.7   Platelets 150 - 400 K/uL 262  311.0  298     CMP     Latest Ref Rng & Units 09/06/2020    6:58 PM 01/17/2018    8:46 AM 09/21/2015    11:34 AM  CMP  Glucose 70 - 99 mg/dL 94  896  94   BUN 6 - 20 mg/dL 15  11  11    Creatinine 0.44 - 1.00 mg/dL 9.14  9.21  9.07   Sodium 135 - 145 mmol/L 137  138  140   Potassium 3.5 - 5.1 mmol/L 4.0  4.5  4.1   Chloride 98 - 111 mmol/L 97  97  101   CO2 22 - 32 mmol/L 29  25  29    Calcium 8.9 - 10.3 mg/dL 9.8  9.6  9.6   Total Protein 6.5 - 8.1 g/dL 8.1     Total Bilirubin 0.3 - 1.2 mg/dL 0.4     Alkaline Phos 38 - 126 U/L 92     AST 15 - 41 U/L 23     ALT 0 - 44 U/L 19        Lab Results  Component Value Date   TSH 1.18 07/30/2012    01/21/21 echo- Left ventricular ejection fraction, by estimation, is 55 to 60%.  07/18/17 colonoscopy, recall 06/2022 Impression:  - Two 6 mm polyps at the ileocecal valve, removed with a cold snare. Resected and retrieved.  - Three 5 to 8 mm polyps in the descending colon, removed with a cold snare. Resected and retrieved.  - Mild diverticulosis in the left colon. There was no evidence of diverticular bleeding.  - Internal and external hemorrhoids Path:  Diagnosis Surgical [P], ileocecal valve, descending, polyp (5) - TUBULAR ADENOMA(S) (MULTIPLE FRAGMENTS) - HYPERPLASTIC POLYP(S). (MULTIPLE FRAGMENTS). - HIGH GRADE DYSPLASIA IS NOT IDENTIFIED.  03/21/2012 colonoscopy Endoscopic impression 5 mm sessile polyp in the proximal transverse colon Path:  Diagnosis Surgical [P], transverse colon, polyp - HYPERPLASTIC POLYP (X1). - NO DYSPLASIA OR MALIGNANCY IDENTIFIED.  Assessment: Encounter Diagnoses  Name Primary?   Internal hemorrhoids Yes   Rectal bleeding    Altered bowel habits    History of colonic polyps    Pruritus ani    Family history of colon  cancer    Family history of colonic polyps      63 year old female patient who presents for colon screening colonoscopy for history of colonic poylps, will schedule procedure today with Dr. [name]. She has intermittent issues with constipation so we discussed high fiber diet and using Miralax  po daily as needed. She would like to have colonoscopy then discuss with Dr. [name] her options for internal/external hemorrhoids. I briefly discussed hemorrhoid banding for internal hemorrhoids and surgical removal for external.   Plan: - Reinforce High fiber diet -Ibgard samples for gas -Increase Metamucil from once daily to twice daily - Recommend Miralax po daily if needed for constipation - Schedule for a colonoscopy in [facility] in Dr. [name]. The risks and benefits of colonoscopy with possible polypectomy / biopsies were discussed and the patient agrees to proceed.   Thank you for the courtesy of this consult. Please call me with any questions or concerns.   Juanita Devincent, FNP-C [facility] 02/02/2024, 2:05 PM  Cc: No ref. provider found

## 2024-02-02 ENCOUNTER — Encounter: Payer: Self-pay | Admitting: Gastroenterology

## 2024-02-02 ENCOUNTER — Ambulatory Visit (INDEPENDENT_AMBULATORY_CARE_PROVIDER_SITE_OTHER): Payer: BC Managed Care – PPO | Admitting: Gastroenterology

## 2024-02-02 VITALS — BP 120/70 | HR 88 | Ht 65.0 in | Wt 157.0 lb

## 2024-02-02 DIAGNOSIS — R194 Change in bowel habit: Secondary | ICD-10-CM | POA: Diagnosis not present

## 2024-02-02 DIAGNOSIS — K648 Other hemorrhoids: Secondary | ICD-10-CM

## 2024-02-02 DIAGNOSIS — Z800 Family history of malignant neoplasm of digestive organs: Secondary | ICD-10-CM

## 2024-02-02 DIAGNOSIS — L290 Pruritus ani: Secondary | ICD-10-CM | POA: Diagnosis not present

## 2024-02-02 DIAGNOSIS — K625 Hemorrhage of anus and rectum: Secondary | ICD-10-CM

## 2024-02-02 DIAGNOSIS — Z83719 Family history of colon polyps, unspecified: Secondary | ICD-10-CM

## 2024-02-02 DIAGNOSIS — Z860100 Personal history of colon polyps, unspecified: Secondary | ICD-10-CM

## 2024-02-02 MED ORDER — NA SULFATE-K SULFATE-MG SULF 17.5-3.13-1.6 GM/177ML PO SOLN: 1.0000 | Freq: Once | ORAL | 0 refills | Status: AC

## 2024-02-02 NOTE — Patient Instructions (Addendum)
 Please purchase the following medications over the counter and take as directed:  Metamucil: 1 teaspoon twice daily  Miralax: one capful daily as needed  We have given you samples of the following medication to take:   IBGard  You have been scheduled for a colonoscopy. Please follow written instructions given to you at your visit today.   If you use inhalers (even only as needed), please bring them with you on the day of your procedure.  DO NOT TAKE 7 DAYS PRIOR TO TEST- Trulicity (dulaglutide) Ozempic, Wegovy (semaglutide) Mounjaro (tirzepatide) Bydureon Bcise (exanatide extended release)  DO NOT TAKE 1 DAY PRIOR TO YOUR TEST Rybelsus (semaglutide) Adlyxin (lixisenatide) Victoza (liraglutide) Byetta (exanatide)  ______________________________________________________  If your blood pressure at your visit was 140/90 or greater, please contact your primary care physician to follow up on this.  _______________________________________________________  If you are age 28 or older, your body mass index should be between 23-30. Your Body mass index is 26.13 kg/m. If this is out of the aforementioned range listed, please consider follow up with your Primary Care Provider.  If you are age 29 or younger, your body mass index should be between 19-25. Your Body mass index is 26.13 kg/m. If this is out of the aformentioned range listed, please consider follow up with your Primary Care Provider.   ________________________________________________________  The [facility] providers would like to encourage you to use MYCHART to communicate with providers for non-urgent requests or questions.  Due to long hold times on the telephone, sending your provider a message by [facility] may be a faster and more efficient way to get a response.  Please allow 48 business hours for a response.  Please remember that this is for non-urgent requests.   _______________________________________________________  Thank you for trusting me with your gastrointestinal care!   Deanna May, NP

## 2024-02-02 NOTE — Progress Notes (Signed)
 I agree with the assessment and plan as outlined by Ms. May.

## 2024-02-05 ENCOUNTER — Encounter: Payer: Self-pay | Admitting: Internal Medicine

## 2024-02-05 ENCOUNTER — Ambulatory Visit: Payer: BC Managed Care – PPO | Admitting: Adult Health

## 2024-02-06 ENCOUNTER — Encounter: Payer: Self-pay | Admitting: Adult Health

## 2024-02-06 ENCOUNTER — Ambulatory Visit: Payer: BC Managed Care – PPO | Admitting: Adult Health

## 2024-02-06 DIAGNOSIS — G4700 Insomnia, unspecified: Secondary | ICD-10-CM | POA: Diagnosis not present

## 2024-02-06 DIAGNOSIS — F411 Generalized anxiety disorder: Secondary | ICD-10-CM

## 2024-02-06 DIAGNOSIS — F3181 Bipolar II disorder: Secondary | ICD-10-CM

## 2024-02-06 DIAGNOSIS — F900 Attention-deficit hyperactivity disorder, predominantly inattentive type: Secondary | ICD-10-CM

## 2024-02-06 DIAGNOSIS — K219 Gastro-esophageal reflux disease without esophagitis: Secondary | ICD-10-CM | POA: Diagnosis not present

## 2024-02-06 MED ORDER — BUPROPION HCL ER (XL) 150 MG PO TB24: 450.0000 mg | ORAL_TABLET | Freq: Every morning | ORAL | 5 refills | Status: DC

## 2024-02-06 MED ORDER — AMPHETAMINE-DEXTROAMPHETAMINE 10 MG PO TABS: 10.0000 mg | ORAL_TABLET | Freq: Every day | ORAL | 0 refills | Status: DC

## 2024-02-06 MED ORDER — ZOLPIDEM TARTRATE 10 MG PO TABS: ORAL_TABLET | ORAL | 2 refills | Status: AC

## 2024-02-06 MED ORDER — ALPRAZOLAM 1 MG PO TABS
ORAL_TABLET | ORAL | 1 refills | Status: DC
Start: 2024-02-06 — End: 2024-09-16

## 2024-02-06 MED ORDER — TRAZODONE HCL 100 MG PO TABS: 100.0000 mg | ORAL_TABLET | Freq: Every day | ORAL | 2 refills | Status: DC

## 2024-02-06 MED ORDER — TOPIRAMATE 25 MG PO TABS: ORAL_TABLET | ORAL | 2 refills | Status: DC

## 2024-02-06 MED ORDER — LAMOTRIGINE 150 MG PO TABS: 150.0000 mg | ORAL_TABLET | Freq: Every day | ORAL | 5 refills | Status: DC

## 2024-02-06 MED ORDER — AMPHETAMINE-DEXTROAMPHET ER 30 MG PO CP24: 30.0000 mg | ORAL_CAPSULE | Freq: Every day | ORAL | 0 refills | Status: DC

## 2024-02-06 NOTE — Progress Notes (Signed)
[name] [id number] [birth year] 63 y.o.  Subjective:   Patient ID:  Tammy Randolph is a 63 y.o. (DOB [birth year]) female.  Chief Complaint: No chief complaint on file.   HPI Tammy Randolph presents to the office today for follow-up of GAD, insomnia, BPD-2, and ADHD.  Describes mood today as "ok". Pleasant. Reports tearfulness. Mood symptoms - reports some depression, anxiety, and irritability and anxiety - more situational. Denies panic attacks. Reports some worry, rumination, and over thinking. Mood is variable - situational stressors. Stating "I feel like I'm doing alright". Feels like medications are helpful. Stable interest and motivation. Taking medications as prescribed.  Energy levels stable. Active, does not have a regular exercise routine.  Enjoys some usual interests and activities. Married. Lives with husband. Children local. Spending time with family and friends. Appetite adequate. Weight loss - 155 pounds. Sleeping better some nights than others. Feels like the Trazadone has been helpful for sleep, but would like to increase dose. Does not take Ambien on nights she takes Trazadone. Reports some daytime napping.  Focus and concentration stable. Completing tasks. Managing aspects of household. Has been caring for family members. Denies SI or HI.  Denies AH or VH. Denies self harm. Denies substance use.   Previous medication trials: Celexa, Lexapro, Effexor in addition to Viibryd, Wellbutrin, Lamictal last year, Cymbalta 30 mg   Review of Systems:  Review of Systems  Musculoskeletal:  Negative for gait problem.  Neurological:  Negative for tremors.  Psychiatric/Behavioral:         Please refer to HPI    Medications: I have reviewed the patient's current medications.  Current Outpatient Medications  Medication Sig Dispense Refill   albuterol (PROVENTIL HFA;VENTOLIN HFA) 108 (90 BASE) MCG/ACT inhaler Inhale 1-2 puffs into the lungs every 6 (six) hours as needed for  wheezing or shortness of breath.     ALPRAZolam (XANAX) 1 MG tablet TAKE 1 TABLET BY MOUTH FOUR TIMES DAILY 120 tablet 1   amphetamine-dextroamphetamine (ADDERALL XR) 30 MG 24 hr capsule Take 1 capsule (30 mg total) by mouth daily. 30 capsule 0   amphetamine-dextroamphetamine (ADDERALL XR) 30 MG 24 hr capsule Take 1 capsule (30 mg total) by mouth daily. 30 capsule 0   amphetamine-dextroamphetamine (ADDERALL) 10 MG tablet Take 1 tablet (10 mg total) by mouth daily. 30 tablet 0   amphetamine-dextroamphetamine (ADDERALL) 10 MG tablet Take 1 tablet (10 mg total) by mouth daily. 30 tablet 0   amphetamine-dextroamphetamine (ADDERALL) 10 MG tablet Take 1 tablet (10 mg total) by mouth daily. 30 tablet 0   baclofen (LIORESAL) 10 MG tablet 10 mg daily.     buPROPion (WELLBUTRIN XL) 150 MG 24 hr tablet Take 3 tablets (450 mg total) by mouth every morning. 90 tablet 5   carvedilol (COREG) 12.5 MG tablet Take 1 tablet (12.5 mg total) by mouth 2 (two) times daily with a meal. 270 tablet 3   estradiol (VIVELLE-DOT) 0.05 MG/24HR patch 1 patch 2 (two) times a week.     estradiol (VIVELLE-DOT) 0.05 MG/24HR patch Place onto the skin.     lamoTRIgine (LAMICTAL) 150 MG tablet Take 1 tablet (150 mg total) by mouth daily. 30 tablet 5   Misc. Devices MISC Apply 1 gram to the vestibule daily (Estradiol 0.01% with Testosterone 0.1%)     MYRBETRIQ 50 MG TB24 tablet Take 50 mg by mouth daily.     ondansetron (ZOFRAN) 4 MG tablet Take 1 tablet (4 mg total) by mouth every 8 (eight)  hours as needed for nausea or vomiting. 20 tablet 0   oxyCODONE-acetaminophen (PERCOCET) 10-325 MG tablet Take 1 tablet by mouth every 4 (four) hours as needed for pain. 60 tablet 0   pseudoephedrine-acetaminophen (TYLENOL SINUS) 30-500 MG TABS Take 1 tablet by mouth every 4 (four) hours as needed (Seasonal Allergies).     spironolactone (ALDACTONE) 25 MG tablet Take 1 tablet (25 mg total) by mouth daily. Pt needs to make appt with provider for  further refills - 2nd attempt 15 tablet 0   SUMAtriptan (IMITREX) 50 MG tablet as directed. TAKE 1 TABLET BY MOUTH AS NEEDED     testosterone (ANDROGEL) 50 MG/5GM (1%) GEL SMARTSIG:Topical     traZODone (DESYREL) 50 MG tablet Take 1 tablet (50 mg total) by mouth at bedtime. 30 tablet 5   zolpidem (AMBIEN) 10 MG tablet Take one tablet by mouth every night at bedtime 30 tablet 2   No current facility-administered medications for this visit.    Medication Side Effects: None  Allergies:  Allergies  Allergen Reactions   Hydrocodone-Acetaminophen Itching and Rash   Morphine Other (See Comments)    Pt did not like the way it made her feel    Past Medical History:  Diagnosis Date   Adenomatous colon polyp 1996   Anxiety    ANXIETY 07/15/2008   Qualifier: Diagnosis of  By: Yetta Barre CMA, Chemira     Arthritis    Asthma    Bipolar disorder (HCC)    Chronic fatigue    Depression    History of blood transfusion 2003   Hypertension    Pneumonia    couple of times last being last year   PONV (postoperative nausea and vomiting)    Seasonal allergies    Spinal headache     Past Medical History, Surgical history, Social history, and Family history were reviewed and updated as appropriate.   Please see review of systems for further details on the patient's review from today.   Objective:   Physical Exam:  There were no vitals taken for this visit.  Physical Exam Constitutional:      General: She is not in acute distress. Musculoskeletal:        General: No deformity.  Neurological:     Mental Status: She is alert and oriented to person, place, and time.     Coordination: Coordination normal.  Psychiatric:        Attention and Perception: Attention and perception normal. She does not perceive auditory or visual hallucinations.        Mood and Affect: Affect is not labile, blunt, angry or inappropriate.        Speech: Speech normal.        Behavior: Behavior normal.        Thought  Content: Thought content normal. Thought content is not paranoid or delusional. Thought content does not include homicidal or suicidal ideation. Thought content does not include homicidal or suicidal plan.        Cognition and Memory: Cognition and memory normal.        Judgment: Judgment normal.     Comments: Insight intact     Lab Review:     Component Value Date/Time   NA 137 09/06/2020 1858   NA 138 01/17/2018 0846   K 4.0 09/06/2020 1858   CL 97 (L) 09/06/2020 1858   CO2 29 09/06/2020 1858   GLUCOSE 94 09/06/2020 1858   BUN 15 09/06/2020 1858   BUN 11 01/17/2018  0846   CREATININE 0.85 09/06/2020 1858   CALCIUM 9.8 09/06/2020 1858   PROT 8.1 09/06/2020 1858   ALBUMIN 4.7 09/06/2020 1858   AST 23 09/06/2020 1858   ALT 19 09/06/2020 1858   ALKPHOS 92 09/06/2020 1858   BILITOT 0.4 09/06/2020 1858   GFRNONAA >60 09/06/2020 1858   GFRAA >60 09/06/2020 1858       Component Value Date/Time   WBC 6.8 09/06/2020 1858   RBC 5.11 09/06/2020 1858   HGB 15.4 (H) 09/06/2020 1858   HGB 13.7 02/02/2011 1448   HCT 46.0 09/06/2020 1858   HCT 39.3 02/02/2011 1448   PLT 262 09/06/2020 1858   PLT 264 02/02/2011 1448   MCV 90.0 09/06/2020 1858   MCV 89.3 02/02/2011 1448   MCH 30.1 09/06/2020 1858   MCHC 33.5 09/06/2020 1858   RDW 12.2 09/06/2020 1858   RDW 12.3 02/02/2011 1448   LYMPHSABS 2.4 09/06/2020 1858   LYMPHSABS 2.0 02/02/2011 1448   MONOABS 0.7 09/06/2020 1858   MONOABS 0.5 02/02/2011 1448   EOSABS 0.4 09/06/2020 1858   EOSABS 0.2 02/02/2011 1448   BASOSABS 0.1 09/06/2020 1858   BASOSABS 0.0 02/02/2011 1448    No results found for: "POCLITH", "LITHIUM"   No results found for: "PHENYTOIN", "PHENOBARB", "VALPROATE", "CBMZ"   .res Assessment: Plan:    Plan:  PDMP reviewed  1. Wellbutrin 150 mg XL taking 3 tablets total 450 mg every morning 2. Adderall 30 mg - XR daily in the morning - not taking every day 3. Ambien 10mg  at hs - taking some nights 4. Xanax 1mg   - 4 x daily - taking some days 5. Adderall 10mg  - 1/2 to 1 tablet daily as needed - not taking every day 6. Continue Lamictal 150mg  daily at hs for mood stabilization. 7. Increase Trazadone from 50mg  to 100mg  daily for insomnia. 8. Add Topamax 25mg  at hs x 7 days, then 50mg  at hs for mood stabilization.  RTC 3 months  Patient advised to contact office with any questions, adverse effects, or acute worsening in signs and symptoms.  Discussed potential benefits, risk, and side effects of benzodiazepines to include potential risk of tolerance and dependence, as well as possible drowsiness.  Advised patient not to drive if experiencing drowsiness and to take lowest possible effective dose to minimize risk of dependence and tolerance.  Discussed potential benefits, risks, and side effects of stimulants with patient to include increased heart rate, palpitations, insomnia, increased anxiety, increased irritability, or decreased appetite.  Instructed patient to contact office if experiencing any significant tolerability issues.  Counseled patient regarding potential benefits, risks, and side effects of Lamictal to include potential risk of Stevens-Johnson syndrome. Advised patient to stop taking Lamictal and contact office immediately if rash develops and to seek urgent medical attention if rash is severe and/or spreading quickly.  There are no diagnoses linked to this encounter.   Please see After Visit Summary for patient specific instructions.  Future Appointments  Date Time Provider Department Center  02/08/2024  4:00 PM Imogene Burn, MD [facility]  05/09/2024  1:20 PM Donato Schultz, DO [facility]    No orders of the defined types were placed in this encounter.   -------------------------------

## 2024-02-08 ENCOUNTER — Encounter: Payer: Self-pay | Admitting: Internal Medicine

## 2024-02-08 ENCOUNTER — Telehealth: Payer: Self-pay

## 2024-02-08 ENCOUNTER — Ambulatory Visit: Payer: BC Managed Care – PPO | Admitting: Internal Medicine

## 2024-02-08 VITALS — BP 134/82 | HR 72 | Temp 97.4°F | Resp 12 | Ht 65.0 in | Wt 157.0 lb

## 2024-02-08 DIAGNOSIS — Z860100 Personal history of colon polyps, unspecified: Secondary | ICD-10-CM

## 2024-02-08 DIAGNOSIS — D125 Benign neoplasm of sigmoid colon: Secondary | ICD-10-CM

## 2024-02-08 DIAGNOSIS — D120 Benign neoplasm of cecum: Secondary | ICD-10-CM | POA: Diagnosis not present

## 2024-02-08 DIAGNOSIS — K635 Polyp of colon: Secondary | ICD-10-CM | POA: Diagnosis not present

## 2024-02-08 DIAGNOSIS — D122 Benign neoplasm of ascending colon: Secondary | ICD-10-CM | POA: Diagnosis not present

## 2024-02-08 DIAGNOSIS — K5730 Diverticulosis of large intestine without perforation or abscess without bleeding: Secondary | ICD-10-CM | POA: Diagnosis not present

## 2024-02-08 DIAGNOSIS — D123 Benign neoplasm of transverse colon: Secondary | ICD-10-CM

## 2024-02-08 DIAGNOSIS — K648 Other hemorrhoids: Secondary | ICD-10-CM | POA: Diagnosis not present

## 2024-02-08 DIAGNOSIS — Z1211 Encounter for screening for malignant neoplasm of colon: Secondary | ICD-10-CM

## 2024-02-08 MED ORDER — SODIUM CHLORIDE 0.9 % IV SOLN: 500.0000 mL | Freq: Once | INTRAVENOUS | Status: DC

## 2024-02-08 NOTE — Telephone Encounter (Signed)
Called patient per provider request to schedule hemorrhoid banding no answer left message on voice to call office back.

## 2024-02-08 NOTE — Op Note (Addendum)
[facility] Patient Name: Tammy Randolph Procedure Date: 02/08/2024 1:31 PM MRN: [id number] Endoscopist: Particia Lather , , [id number] Age: 63 Referring MD:  Date of Birth: October 27, 1961 Gender: Female Account #: [id number] Procedure:                Colonoscopy Indications:              High risk colon cancer surveillance: Personal                            history of colonic polyps Medicines:                Monitored Anesthesia Care Procedure:                Pre-Anesthesia Assessment:                           - Prior to the procedure, a History and Physical                            was performed, and patient medications and                            allergies were reviewed. The patient's tolerance of                            previous anesthesia was also reviewed. The risks                            and benefits of the procedure and the sedation                            options and risks were discussed with the patient.                            All questions were answered, and informed consent                            was obtained. Prior Anticoagulants: The patient has                            taken no anticoagulant or antiplatelet agents. ASA                            Grade Assessment: II - A patient with mild systemic                            disease. After reviewing the risks and benefits,                            the patient was deemed in satisfactory condition to                            undergo the procedure.  After obtaining informed consent, the colonoscope                            was passed under direct vision. Throughout the                            procedure, the patient's blood pressure, pulse, and                            oxygen saturations were monitored continuously. The                            [id number]-HQ190L Colonoscope [id number] was introduced                            through the anus and advanced to  the the terminal                            ileum. The colonoscopy was performed without                            difficulty. The patient tolerated the procedure                            well. The quality of the bowel preparation was                            excellent. The terminal ileum, ileocecal valve,                            appendiceal orifice, and rectum were photographed. Scope In: 1:33:57 PM Scope Out: 1:53:17 PM Scope Withdrawal Time: 0 hours 16 minutes 54 seconds  Total Procedure Duration: 0 hours 19 minutes 20 seconds  Findings:                 The terminal ileum appeared normal.                           Nine sessile polyps were found in the transverse                            colon, ascending colon and cecum. The polyps were 3                            to 6 mm in size. These polyps were removed with a                            cold snare. Resection and retrieval were complete.                           Multiple diverticula were found in the sigmoid                            colon and descending colon.  A 5 mm polyp was found in the sigmoid colon. The                            polyp was sessile. The polyp was removed with a                            cold snare. Resection and retrieval were complete.                           Non-bleeding internal hemorrhoids were found during                            retroflexion. Complications:            No immediate complications. Estimated Blood Loss:     Estimated blood loss was minimal. Impression:               - The examined portion of the ileum was normal.                           - Nine 3 to 6 mm polyps in the transverse colon, in                            the ascending colon and in the cecum, removed with                            a cold snare. Resected and retrieved.                           - Diverticulosis in the sigmoid colon and in the                            descending  colon.                           - One 5 mm polyp in the sigmoid colon, removed with                            a cold snare. Resected and retrieved.                           - Non-bleeding internal hemorrhoids. Recommendation:           - Discharge patient to home (with escort).                           - Await pathology results.                           - Will get patient scheduled for hemorrhoidal                            banding.                           -  The findings and recommendations were discussed                            with the patient. Dr Particia Lather "Alan Ripper" Leonides Schanz,  02/08/2024 2:03:50 PM

## 2024-02-08 NOTE — Progress Notes (Signed)
Report to PACU, RN, vss, BBS= Clear.

## 2024-02-08 NOTE — Patient Instructions (Addendum)
Handouts on polyps, hemorrhoids, and diverticulosis given to you today  Await pathology results from Dr. Leonides Schanz    YOU HAD AN ENDOSCOPIC PROCEDURE TODAY AT THE [facility]:   Refer to the procedure report that was given to you for any specific questions about what was found during the examination.  If the procedure report does not answer your questions, please call your gastroenterologist to clarify.  If you requested that your care partner not be given the details of your procedure findings, then the procedure report has been included in a sealed envelope for you to review at your convenience later.  YOU SHOULD EXPECT: Some feelings of bloating in the abdomen. Passage of more gas than usual.  Walking can help get rid of the air that was put into your GI tract during the procedure and reduce the bloating. If you had a lower endoscopy (such as a colonoscopy or flexible sigmoidoscopy) you may notice spotting of blood in your stool or on the toilet paper. If you underwent a bowel prep for your procedure, you may not have a normal bowel movement for a few days.  Please Note:  You might notice some irritation and congestion in your nose or some drainage.  This is from the oxygen used during your procedure.  There is no need for concern and it should clear up in a day or so.  SYMPTOMS TO REPORT IMMEDIATELY:  Following lower endoscopy (colonoscopy or flexible sigmoidoscopy):  Excessive amounts of blood in the stool  Significant tenderness or worsening of abdominal pains  Swelling of the abdomen that is new, acute  Fever of 100F or higher  For urgent or emergent issues, a gastroenterologist can be reached at any hour by calling [phone]. Do not use MyChart messaging for urgent concerns.    DIET:  We do recommend a small meal at first, but then you may proceed to your regular diet.  Drink plenty of fluids but you should avoid alcoholic beverages for 24 hours.  ACTIVITY:  You should  plan to take it easy for the rest of today and you should NOT DRIVE or use heavy machinery until tomorrow (because of the sedation medicines used during the test).    FOLLOW UP: Our staff will call the number listed on your records the next business day following your procedure.  We will call around 7:15- 8:00 am to check on you and address any questions or concerns that you may have regarding the information given to you following your procedure. If we do not reach you, we will leave a message.     If any biopsies were taken you will be contacted by phone or by letter within the next 1-3 weeks.  Please call us at [phone] if you have not heard about the biopsies in 3 weeks.    SIGNATURES/CONFIDENTIALITY: You and/or your care partner have signed paperwork which will be entered into your electronic medical record.  These signatures attest to the fact that that the information above on your After Visit Summary has been reviewed and is understood.  Full responsibility of the confidentiality of this discharge information lies with you and/or your care-partner.

## 2024-02-08 NOTE — Progress Notes (Signed)
GASTROENTEROLOGY PROCEDURE H&P NOTE   Primary Care Physician: Donato Schultz, DO    Reason for Procedure:  History of colon polyps  Plan:    Colonoscopy  Patient is appropriate for endoscopic procedure(s) in the ambulatory ([facility]) setting.  The nature of the procedure, as well as the risks, benefits, and alternatives were carefully and thoroughly reviewed with the patient. Ample time for discussion and questions allowed. The patient understood, was satisfied, and agreed to proceed.     HPI: Tammy Randolph is a 63 y.o. female who presents for colonoscopy for evaluation of history of colon polyps.  Patient was most recently seen in the Gastroenterology Clinic on 02/02/24.  No interval change in medical history since that appointment. Please refer to that note for full details regarding GI history and clinical presentation.   Past Medical History:  Diagnosis Date   Adenomatous colon polyp 1996   Anxiety    ANXIETY 07/15/2008   Qualifier: Diagnosis of  By: Yetta Barre CMA, Chemira     Arthritis    Asthma    Bipolar disorder (HCC)    Chronic fatigue    Depression    History of blood transfusion 2003   Hypertension    Pneumonia    couple of times last being last year   PONV (postoperative nausea and vomiting)    Seasonal allergies    Spinal headache     Past Surgical History:  Procedure Laterality Date   ABDOMINAL HYSTERECTOMY  2004   and USO for endometriosis   ANTERIOR CERVICAL DECOMP/DISCECTOMY FUSION N/A 09/23/2015   Procedure: ANTERIOR CERVICAL DISCECTOMY FUSION C4-7     (3 LEVELS);  Surgeon: Venita Lick, MD;  Location: [facility];  Service: Orthopedics;  Laterality: N/A;   APPENDECTOMY  1988   BREAST ENHANCEMENT SURGERY Bilateral 2008   COLONOSCOPY W/ POLYPECTOMY  2013   Dr Russella Dar   HYSTERECTOMY ABDOMINAL WITH SALPINGECTOMY     x4 for endometriosis   KNEE ARTHROSCOPY WITH ANTERIOR CRUCIATE LIGAMENT (ACL) REPAIR Right 01/26/2013   LUMBAR LAMINECTOMY  2008    TONSILLECTOMY  2009    Prior to Admission medications   Medication Sig Start Date End Date Taking? Authorizing Provider  ALPRAZolam Prudy Feeler) 1 MG tablet TAKE 1 TABLET BY MOUTH FOUR TIMES DAILY 02/06/24  Yes Mozingo, Thereasa Solo, NP  amphetamine-dextroamphetamine (ADDERALL) 10 MG tablet Take 1 tablet (10 mg total) by mouth daily. 08/28/23  Yes Mozingo, Thereasa Solo, NP  buPROPion (WELLBUTRIN XL) 150 MG 24 hr tablet Take 3 tablets (450 mg total) by mouth every morning. 02/06/24  Yes Mozingo, Thereasa Solo, NP  carvedilol (COREG) 12.5 MG tablet Take 1 tablet (12.5 mg total) by mouth 2 (two) times daily with a meal. 12/12/22  Yes Sheilah Pigeon, PA-C  estradiol (VIVELLE-DOT) 0.05 MG/24HR patch 1 patch 2 (two) times a week. 07/11/21  Yes [provider]  lamoTRIgine (LAMICTAL) 150 MG tablet Take 1 tablet (150 mg total) by mouth daily. 02/06/24  Yes Mozingo, Thereasa Solo, NP  Misc. Devices MISC Apply 1 gram to the vestibule daily (Estradiol 0.01% with Testosterone 0.1%) 04/20/21  Yes [provider]  MYRBETRIQ 50 MG TB24 tablet Take 50 mg by mouth daily. 08/24/21  Yes [provider]  pseudoephedrine-acetaminophen (TYLENOL SINUS) 30-500 MG TABS Take 1 tablet by mouth every 4 (four) hours as needed (Seasonal Allergies).   Yes [provider]  spironolactone (ALDACTONE) 25 MG tablet Take 1 tablet (25 mg total) by mouth daily. Pt needs to  make appt with provider for further refills - 2nd attempt 12/30/22  Yes Sheilah Pigeon, PA-C  topiramate (TOPAMAX) 25 MG tablet Take one tablet at bedtime for 7 nights, then take two tablets at bedtime. 02/06/24  Yes Mozingo, Thereasa Solo, NP  traZODone (DESYREL) 100 MG tablet Take 1 tablet (100 mg total) by mouth at bedtime. 02/06/24  Yes Mozingo, Thereasa Solo, NP  zolpidem (AMBIEN) 10 MG tablet Take one tablet by mouth every night at bedtime 02/06/24  Yes Mozingo, Thereasa Solo, NP  albuterol (PROVENTIL HFA;VENTOLIN HFA)  108 (90 BASE) MCG/ACT inhaler Inhale 1-2 puffs into the lungs every 6 (six) hours as needed for wheezing or shortness of breath.    [provider]  amphetamine-dextroamphetamine (ADDERALL XR) 30 MG 24 hr capsule Take 1 capsule (30 mg total) by mouth daily. 01/02/24   Mozingo, Thereasa Solo, NP  amphetamine-dextroamphetamine (ADDERALL XR) 30 MG 24 hr capsule Take 1 capsule (30 mg total) by mouth daily. 02/06/24   Mozingo, Thereasa Solo, NP  amphetamine-dextroamphetamine (ADDERALL) 10 MG tablet Take 1 tablet (10 mg total) by mouth daily. 09/25/23   Mozingo, Thereasa Solo, NP  amphetamine-dextroamphetamine (ADDERALL) 10 MG tablet Take 1 tablet (10 mg total) by mouth daily. 02/06/24   Mozingo, Thereasa Solo, NP  baclofen (LIORESAL) 10 MG tablet 10 mg daily. Patient not taking: Reported on 02/08/2024 05/01/21   [provider]  estradiol (VIVELLE-DOT) 0.05 MG/24HR patch Place onto the skin. 06/17/21   [provider]  ondansetron (ZOFRAN) 4 MG tablet Take 1 tablet (4 mg total) by mouth every 8 (eight) hours as needed for nausea or vomiting. 09/23/15   Venita Lick, MD  oxyCODONE-acetaminophen (PERCOCET) 10-325 MG tablet Take 1 tablet by mouth every 4 (four) hours as needed for pain. Patient not taking: Reported on 02/08/2024 09/23/15   Venita Lick, MD  SUMAtriptan (IMITREX) 50 MG tablet as directed. TAKE 1 TABLET BY MOUTH AS NEEDED    [provider]  testosterone (ANDROGEL) 50 MG/5GM (1%) GEL SMARTSIG:Topical Patient not taking: Reported on 02/08/2024 08/18/21   [provider]    Current Outpatient Medications  Medication Sig Dispense Refill   ALPRAZolam (XANAX) 1 MG tablet TAKE 1 TABLET BY MOUTH FOUR TIMES DAILY 120 tablet 1   amphetamine-dextroamphetamine (ADDERALL) 10 MG tablet Take 1 tablet (10 mg total) by mouth daily. 30 tablet 0   buPROPion (WELLBUTRIN XL) 150 MG 24 hr tablet Take 3 tablets (450 mg total) by mouth every morning. 90 tablet 5    carvedilol (COREG) 12.5 MG tablet Take 1 tablet (12.5 mg total) by mouth 2 (two) times daily with a meal. 270 tablet 3   estradiol (VIVELLE-DOT) 0.05 MG/24HR patch 1 patch 2 (two) times a week.     lamoTRIgine (LAMICTAL) 150 MG tablet Take 1 tablet (150 mg total) by mouth daily. 30 tablet 5   Misc. Devices MISC Apply 1 gram to the vestibule daily (Estradiol 0.01% with Testosterone 0.1%)     MYRBETRIQ 50 MG TB24 tablet Take 50 mg by mouth daily.     pseudoephedrine-acetaminophen (TYLENOL SINUS) 30-500 MG TABS Take 1 tablet by mouth every 4 (four) hours as needed (Seasonal Allergies).     spironolactone (ALDACTONE) 25 MG tablet Take 1 tablet (25 mg total) by mouth daily. Pt needs to make appt with provider for further refills - 2nd attempt 15 tablet 0   topiramate (TOPAMAX) 25 MG tablet Take one tablet at bedtime for 7 nights, then take two tablets at bedtime. 60  tablet 2   traZODone (DESYREL) 100 MG tablet Take 1 tablet (100 mg total) by mouth at bedtime. 30 tablet 2   zolpidem (AMBIEN) 10 MG tablet Take one tablet by mouth every night at bedtime 30 tablet 2   albuterol (PROVENTIL HFA;VENTOLIN HFA) 108 (90 BASE) MCG/ACT inhaler Inhale 1-2 puffs into the lungs every 6 (six) hours as needed for wheezing or shortness of breath.     amphetamine-dextroamphetamine (ADDERALL XR) 30 MG 24 hr capsule Take 1 capsule (30 mg total) by mouth daily. 30 capsule 0   amphetamine-dextroamphetamine (ADDERALL XR) 30 MG 24 hr capsule Take 1 capsule (30 mg total) by mouth daily. 30 capsule 0   amphetamine-dextroamphetamine (ADDERALL) 10 MG tablet Take 1 tablet (10 mg total) by mouth daily. 30 tablet 0   amphetamine-dextroamphetamine (ADDERALL) 10 MG tablet Take 1 tablet (10 mg total) by mouth daily. 30 tablet 0   baclofen (LIORESAL) 10 MG tablet 10 mg daily. (Patient not taking: Reported on 02/08/2024)     estradiol (VIVELLE-DOT) 0.05 MG/24HR patch Place onto the skin.     ondansetron (ZOFRAN) 4 MG tablet Take 1 tablet (4  mg total) by mouth every 8 (eight) hours as needed for nausea or vomiting. 20 tablet 0   oxyCODONE-acetaminophen (PERCOCET) 10-325 MG tablet Take 1 tablet by mouth every 4 (four) hours as needed for pain. (Patient not taking: Reported on 02/08/2024) 60 tablet 0   SUMAtriptan (IMITREX) 50 MG tablet as directed. TAKE 1 TABLET BY MOUTH AS NEEDED     testosterone (ANDROGEL) 50 MG/5GM (1%) GEL SMARTSIG:Topical (Patient not taking: Reported on 02/08/2024)     Current Facility-Administered Medications  Medication Dose Route Frequency Provider Last Rate Last Admin   0.9 %  sodium chloride infusion  500 mL Intravenous Once Imogene Burn, MD        Allergies as of 02/08/2024 - Review Complete 02/08/2024  Allergen Reaction Noted   Hydrocodone-acetaminophen Itching and Rash 09/22/2009   Morphine Other (See Comments) 09/22/2009    Family History  Problem Relation Age of Onset   Colon cancer Brother 14   Colon polyps Brother    Stomach cancer Maternal Uncle 67   Colon cancer Paternal Uncle 65   Colon cancer Maternal Grandmother 50   Sleep apnea Father    CVA Father    Sleep apnea Sister    Hypertension Sister    Coronary artery disease Paternal Aunt    Pancreatic cancer Paternal Uncle    CVA Mother        x3   Hypertension Mother    Arthritis Mother    Breast cancer Mother    Heart Problems Mother        pacemaker   Heart attack Maternal Uncle    Clotting disorder Brother    Hypertension Sister     Social History   Socioeconomic History   Marital status: Married    Spouse name: Not on file   Number of children: Not on file   Years of education: Not on file   Highest education level: Not on file  Occupational History   Not on file  Tobacco Use   Smoking status: Former    Current packs/day: 0.00    Types: Cigarettes    Quit date: 12/26/1986    Years since quitting: 37.1   Smokeless tobacco: Never   Tobacco comments:    up to 1& 1/2packs /week  Vaping Use   Vaping status:  Never Used  Substance and Sexual  Activity   Alcohol use: Yes    Alcohol/week: 0.0 standard drinks of alcohol    Comment: socially   Drug use: No   Sexual activity: Not on file  Other Topics Concern   Not on file  Social History Narrative   Not on file   Social Drivers of Health   Financial Resource Strain: Low Risk  (10/31/2021)   Received from [facility], [facility]   Overall Financial Resource Strain (CARDIA)    Difficulty of Paying Living Expenses: Not hard at all  Food Insecurity: No Food Insecurity (10/31/2021)   Received from [facility], [facility]   Hunger Vital Sign    Worried About Running Out of Food in the Last Year: Never true    Ran Out of Food in the Last Year: Never true  Transportation Needs: No Transportation Needs (10/31/2021)   Received from [facility], [facility]   PRAPARE - Transportation    Lack of Transportation (Medical): No    Lack of Transportation (Non-Medical): No  Physical Activity: Insufficiently Active (10/31/2021)   Received from [facility], [facility]   Exercise Vital Sign    Days of Exercise per Week: 3 days    Minutes of Exercise per Session: 40 min  Stress: Stress Concern Present (10/31/2021)   Received from [facility], [facility] - Occupational Stress Questionnaire    Feeling of Stress : To some extent  Social Connections: Unknown (05/06/2022)   Received from [facility], [facility]   Social Network    Social Network: Not on file  Intimate Partner Violence: Unknown (03/29/2022)   Received from [facility], [facility]   HITS    Physically Hurt: Not on file    Insult or Talk Down To: Not on file    Threaten Physical Harm: Not on file    Scream or Curse: Not on file    Physical Exam: Vital signs in last 24 hours: BP 114/81   Pulse 72   Temp (!) 97.4 F (36.3 C) (Skin)   Ht 5\' 5"  (1.651 m)   Wt 157 lb (71.2 kg)   SpO2 99%   BMI 26.13 kg/m  GEN:  NAD EYE: Sclerae anicteric ENT: MMM CV: Non-tachycardic Pulm: No increased WOB GI: Soft NEURO:  Alert & Oriented   Eulah Pont, MD [facility]   02/08/2024 1:19 PM

## 2024-02-08 NOTE — Progress Notes (Signed)
Pt's states no medical or surgical changes since previsit or office visit.

## 2024-02-08 NOTE — Progress Notes (Signed)
Called to room to assist during endoscopic procedure.  Patient ID and intended procedure confirmed with present staff. Received instructions for my participation in the procedure from the performing physician.

## 2024-02-09 ENCOUNTER — Telehealth: Payer: Self-pay | Admitting: *Deleted

## 2024-02-09 NOTE — Telephone Encounter (Signed)
Attempted f/u phone call. No answer. Left message.

## 2024-02-13 ENCOUNTER — Encounter: Payer: Self-pay | Admitting: Internal Medicine

## 2024-02-13 LAB — SURGICAL PATHOLOGY

## 2024-02-19 ENCOUNTER — Other Ambulatory Visit: Payer: Self-pay | Admitting: Physician Assistant

## 2024-02-20 ENCOUNTER — Encounter: Payer: Self-pay | Admitting: Physician Assistant

## 2024-02-20 ENCOUNTER — Ambulatory Visit: Payer: BC Managed Care – PPO | Admitting: Physician Assistant

## 2024-02-20 VITALS — BP 136/80 | HR 86 | Temp 98.0°F | Resp 18 | Ht 65.0 in | Wt 150.0 lb

## 2024-02-20 DIAGNOSIS — E042 Nontoxic multinodular goiter: Secondary | ICD-10-CM | POA: Insufficient documentation

## 2024-02-20 DIAGNOSIS — K219 Gastro-esophageal reflux disease without esophagitis: Secondary | ICD-10-CM | POA: Diagnosis not present

## 2024-02-20 DIAGNOSIS — G43709 Chronic migraine without aura, not intractable, without status migrainosus: Secondary | ICD-10-CM

## 2024-02-20 DIAGNOSIS — I10 Essential (primary) hypertension: Secondary | ICD-10-CM

## 2024-02-20 DIAGNOSIS — Z8249 Family history of ischemic heart disease and other diseases of the circulatory system: Secondary | ICD-10-CM

## 2024-02-20 DIAGNOSIS — R053 Chronic cough: Secondary | ICD-10-CM

## 2024-02-20 DIAGNOSIS — Z803 Family history of malignant neoplasm of breast: Secondary | ICD-10-CM

## 2024-02-20 DIAGNOSIS — R232 Flushing: Secondary | ICD-10-CM

## 2024-02-20 DIAGNOSIS — Z780 Asymptomatic menopausal state: Secondary | ICD-10-CM

## 2024-02-20 DIAGNOSIS — Z13820 Encounter for screening for osteoporosis: Secondary | ICD-10-CM

## 2024-02-20 DIAGNOSIS — L819 Disorder of pigmentation, unspecified: Secondary | ICD-10-CM

## 2024-02-20 DIAGNOSIS — Z800 Family history of malignant neoplasm of digestive organs: Secondary | ICD-10-CM

## 2024-02-20 LAB — CBC WITH DIFFERENTIAL/PLATELET
Basophils Absolute: 0.1 10*3/uL (ref 0.0–0.1)
Basophils Relative: 1.1 % (ref 0.0–3.0)
Eosinophils Absolute: 0.2 10*3/uL (ref 0.0–0.7)
Eosinophils Relative: 4.2 % (ref 0.0–5.0)
HCT: 39.1 % (ref 36.0–46.0)
Hemoglobin: 13.1 g/dL (ref 12.0–15.0)
Lymphocytes Relative: 25.1 % (ref 12.0–46.0)
Lymphs Abs: 1.4 10*3/uL (ref 0.7–4.0)
MCHC: 33.6 g/dL (ref 30.0–36.0)
MCV: 93.4 fL (ref 78.0–100.0)
Monocytes Absolute: 0.7 10*3/uL (ref 0.1–1.0)
Monocytes Relative: 13.1 % — ABNORMAL HIGH (ref 3.0–12.0)
Neutro Abs: 3.1 10*3/uL (ref 1.4–7.7)
Neutrophils Relative %: 56.5 % (ref 43.0–77.0)
Platelets: 248 10*3/uL (ref 150.0–400.0)
RBC: 4.18 Mil/uL (ref 3.87–5.11)
RDW: 12.6 % (ref 11.5–15.5)
WBC: 5.6 10*3/uL (ref 4.0–10.5)

## 2024-02-20 LAB — COMPREHENSIVE METABOLIC PANEL
ALT: 10 U/L (ref 0–35)
AST: 18 U/L (ref 0–37)
Albumin: 4.3 g/dL (ref 3.5–5.2)
Alkaline Phosphatase: 72 U/L (ref 39–117)
BUN: 24 mg/dL — ABNORMAL HIGH (ref 6–23)
CO2: 26 meq/L (ref 19–32)
Calcium: 8.8 mg/dL (ref 8.4–10.5)
Chloride: 106 meq/L (ref 96–112)
Creatinine, Ser: 1.03 mg/dL (ref 0.40–1.20)
GFR: 59.22 mL/min — ABNORMAL LOW (ref >60.00)
Glucose, Bld: 80 mg/dL (ref 70–99)
Potassium: 4.3 meq/L (ref 3.5–5.1)
Sodium: 138 meq/L (ref 135–145)
Total Bilirubin: 0.6 mg/dL (ref 0.2–1.2)
Total Protein: 6.7 g/dL (ref 6.0–8.3)

## 2024-02-20 LAB — TSH: TSH: 1.54 u[IU]/mL (ref 0.35–5.50)

## 2024-02-20 LAB — T4, FREE: Free T4: 0.8 ng/dL (ref 0.60–1.60)

## 2024-02-20 MED ORDER — UBRELVY 50 MG PO TABS
50.0000 mg | ORAL_TABLET | ORAL | 1 refills | Status: AC | PRN
Start: 2024-02-20 — End: ?

## 2024-02-20 MED ORDER — SPIRONOLACTONE 25 MG PO TABS: 25.0000 mg | ORAL_TABLET | Freq: Every day | ORAL | 1 refills | Status: DC

## 2024-02-20 MED ORDER — PANTOPRAZOLE SODIUM 40 MG PO TBEC: 40.0000 mg | DELAYED_RELEASE_TABLET | Freq: Every day | ORAL | 0 refills | Status: DC

## 2024-02-20 NOTE — Assessment & Plan Note (Signed)
 Causing chronic cough. Recommending pantoprazole daily. Ok to cont pepcid/tums prn

## 2024-02-20 NOTE — Assessment & Plan Note (Signed)
 Manages with coreg 12.5 mg BID, spirinolactone 25 mg daily. Well controlled. Ordered cmp F/u 6 mp

## 2024-02-20 NOTE — Patient Instructions (Addendum)
 For hot flashes - Vitamin E at a dose of 800 IU/day -Black Cohosh, 40 mg daily

## 2024-02-20 NOTE — Progress Notes (Signed)
 New patient visit   Patient: Tammy Randolph   DOB: [birth year]   63 y.o. Female  MRN: [id number] Visit Date: 02/20/2024  Today's healthcare provider: Alfredia Ferguson, PA-C   Chief Complaint  Patient presents with   Establish Care   Rash   Subjective    Tammy Randolph is a 63 y.o. female who presents today as a new patient to establish care.   Discussed the use of AI scribe software for clinical note transcription with the patient, who gave verbal consent to proceed.  History of Present Illness   The patient, with a past medical history of sleep apnea, headaches, and menopausal symptoms, presents with multiple concerns. The primary concern is a rash characterized by spots appearing on various parts of the body. The patient reports that these spots started out flat and have been progressively darkening. The patient also has a spot on the lip that has been turning white. The patient has not seen a dermatologist in a year and a half and is in need of a new one.  The patient also reports a persistent cough, which has been present since 2020 and has progressively worsened. The patient has seen an ENT who attributed the cough to acid reflux. The patient has been managing the reflux with over-the-counter medications and dietary changes, but the cough persists and disrupts sleep.  The patient is currently on an estrogen patch for hormone replacement therapy but is still experiencing symptoms of hot flashes.  The patient also reports having nodules on the thyroid that have grown over time. The patient has not had an ultrasound of the thyroid in a while and is due for one.        Past Medical History:  Diagnosis Date   Adenomatous colon polyp 1996   Anxiety    ANXIETY 07/15/2008   Qualifier: Diagnosis of  By: Yetta Barre CMA, Chemira     Arthritis    Asthma    Bipolar disorder (HCC)    Chronic fatigue    Depression    History of blood transfusion 2003   Hypertension    Pneumonia     couple of times last being last year   PONV (postoperative nausea and vomiting)    Seasonal allergies    Spinal headache    Past Surgical History:  Procedure Laterality Date   ABDOMINAL HYSTERECTOMY  2004   and USO for endometriosis   ANTERIOR CERVICAL DECOMP/DISCECTOMY FUSION N/A 09/23/2015   Procedure: ANTERIOR CERVICAL DISCECTOMY FUSION C4-7     (3 LEVELS);  Surgeon: Venita Lick, MD;  Location: [facility] OR;  Service: Orthopedics;  Laterality: N/A;   APPENDECTOMY  1988   BREAST ENHANCEMENT SURGERY Bilateral 2008   COLONOSCOPY W/ POLYPECTOMY  2013   Dr Russella Dar   HYSTERECTOMY ABDOMINAL WITH SALPINGECTOMY     x4 for endometriosis   KNEE ARTHROSCOPY WITH ANTERIOR CRUCIATE LIGAMENT (ACL) REPAIR Right 01/26/2013   LUMBAR LAMINECTOMY  2008   TONSILLECTOMY  2009   Family Status  Relation Name Status   Brother  Alive   Mat Uncle  Deceased   Pat Uncle  Deceased   MGM  Deceased   Father  Deceased   Sister 1/2 (Not Specified)   Emelda Brothers  (Not Specified)   Oneal Grout  (Not Specified)   Mother  Deceased   Mat Uncle  (Not Specified)   Brother  (Not Specified)   Sister  (Not Specified)   MGF  Deceased   PGM  Deceased  PGF  Deceased  No partnership data on file   Family History  Problem Relation Age of Onset   Colon cancer Brother 92   Colon polyps Brother    Stomach cancer Maternal Uncle 80   Colon cancer Paternal Uncle 43   Colon cancer Maternal Grandmother 50   Sleep apnea Father    CVA Father    Sleep apnea Sister    Hypertension Sister    Coronary artery disease Paternal Aunt    Pancreatic cancer Paternal Uncle    CVA Mother        x3   Hypertension Mother    Arthritis Mother    Breast cancer Mother    Heart Problems Mother        pacemaker   Heart attack Maternal Uncle    Clotting disorder Brother    Hypertension Sister    Social History   Socioeconomic History   Marital status: Married    Spouse name: Not on file   Number of children: Not on file   Years of  education: Not on file   Highest education level: Not on file  Occupational History   Not on file  Tobacco Use   Smoking status: Former    Current packs/day: 0.00    Types: Cigarettes    Quit date: 12/26/1986    Years since quitting: 37.1   Smokeless tobacco: Never   Tobacco comments:    up to 1& 1/2packs /week  Vaping Use   Vaping status: Never Used  Substance and Sexual Activity   Alcohol use: Yes    Alcohol/week: 0.0 standard drinks of alcohol    Comment: socially   Drug use: No   Sexual activity: Not on file  Other Topics Concern   Not on file  Social History Narrative   Not on file   Social Drivers of Health   Financial Resource Strain: Low Risk  (10/31/2021)   Received from [facility], [facility]   Overall Financial Resource Strain (CARDIA)    Difficulty of Paying Living Expenses: Not hard at all  Food Insecurity: No Food Insecurity (10/31/2021)   Received from [facility], [facility]   Hunger Vital Sign    Worried About Running Out of Food in the Last Year: Never true    Ran Out of Food in the Last Year: Never true  Transportation Needs: No Transportation Needs (10/31/2021)   Received from [facility], [facility]   PRAPARE - Transportation    Lack of Transportation (Medical): No    Lack of Transportation (Non-Medical): No  Physical Activity: Insufficiently Active (10/31/2021)   Received from [facility], [facility]   Exercise Vital Sign    Days of Exercise per Week: 3 days    Minutes of Exercise per Session: 40 min  Stress: Stress Concern Present (10/31/2021)   Received from [facility], [facility] of Occupational Health - Occupational Stress Questionnaire    Feeling of Stress : To some extent  Social Connections: Unknown (05/06/2022)   Received from [facility], [facility]   Social Network    Social Network: Not on file   Outpatient Medications Prior to Visit  Medication Sig   albuterol (PROVENTIL  HFA;VENTOLIN HFA) 108 (90 BASE) MCG/ACT inhaler Inhale 1-2 puffs into the lungs every 6 (six) hours as needed for wheezing or shortness of breath.   ALPRAZolam (XANAX) 1 MG tablet TAKE 1 TABLET BY MOUTH FOUR TIMES DAILY   amphetamine-dextroamphetamine (ADDERALL XR) 30  MG 24 hr capsule Take 1 capsule (30 mg total) by mouth daily.   amphetamine-dextroamphetamine (ADDERALL XR) 30 MG 24 hr capsule Take 1 capsule (30 mg total) by mouth daily.   amphetamine-dextroamphetamine (ADDERALL) 10 MG tablet Take 1 tablet (10 mg total) by mouth daily.   buPROPion (WELLBUTRIN XL) 150 MG 24 hr tablet Take 3 tablets (450 mg total) by mouth every morning.   carvedilol (COREG) 12.5 MG tablet TAKE 1 TABLET BY MOUTH TWICE DAILY WITH A MEAL   estradiol (VIVELLE-DOT) 0.05 MG/24HR patch 1 patch 2 (two) times a week.   lamoTRIgine (LAMICTAL) 150 MG tablet Take 1 tablet (150 mg total) by mouth daily.   Misc. Devices MISC Apply 1 gram to the vestibule daily (Estradiol 0.01% with Testosterone 0.1%)   MYRBETRIQ 50 MG TB24 tablet Take 50 mg by mouth daily.   ondansetron (ZOFRAN) 4 MG tablet Take 1 tablet (4 mg total) by mouth every 8 (eight) hours as needed for nausea or vomiting.   pseudoephedrine-acetaminophen (TYLENOL SINUS) 30-500 MG TABS Take 1 tablet by mouth every 4 (four) hours as needed (Seasonal Allergies).   topiramate (TOPAMAX) 25 MG tablet Take one tablet at bedtime for 7 nights, then take two tablets at bedtime.   traZODone (DESYREL) 100 MG tablet Take 1 tablet (100 mg total) by mouth at bedtime.   zolpidem (AMBIEN) 10 MG tablet Take one tablet by mouth every night at bedtime   [DISCONTINUED] amphetamine-dextroamphetamine (ADDERALL) 10 MG tablet Take 1 tablet (10 mg total) by mouth daily.   [DISCONTINUED] amphetamine-dextroamphetamine (ADDERALL) 10 MG tablet Take 1 tablet (10 mg total) by mouth daily.   [DISCONTINUED] baclofen (LIORESAL) 10 MG tablet 10 mg daily. (Patient not taking: Reported on 02/08/2024)    [DISCONTINUED] estradiol (VIVELLE-DOT) 0.05 MG/24HR patch Place onto the skin.   [DISCONTINUED] oxyCODONE-acetaminophen (PERCOCET) 10-325 MG tablet Take 1 tablet by mouth every 4 (four) hours as needed for pain. (Patient not taking: Reported on 02/08/2024)   [DISCONTINUED] spironolactone (ALDACTONE) 25 MG tablet Take 1 tablet (25 mg total) by mouth daily. Pt needs to make appt with provider for further refills - 2nd attempt   [DISCONTINUED] SUMAtriptan (IMITREX) 50 MG tablet as directed. TAKE 1 TABLET BY MOUTH AS NEEDED   [DISCONTINUED] testosterone (ANDROGEL) 50 MG/5GM (1%) GEL SMARTSIG:Topical (Patient not taking: Reported on 02/08/2024)   No facility-administered medications prior to visit.   Allergies  Allergen Reactions   Hydrocodone-Acetaminophen Itching and Rash   Morphine Other (See Comments)    Pt did not like the way it made her feel    Immunization History  Administered Date(s) Administered   Influenza Whole 02/02/2009   Influenza-Unspecified 08/26/2014   Td 12/26/1998   Tdap 08/06/2012, 12/27/2018    Health Maintenance  Topic Date Due   COVID-19 Vaccine (1) Never done   Pneumococcal Vaccine 73-23 Years old (1 of 2 - PCV) Never done   HIV Screening  Never done   Hepatitis C Screening  Never done   Zoster Vaccines- Shingrix (1 of 2) Never done   Cervical Cancer Screening (HPV/Pap Cotest)  04/14/2018   MAMMOGRAM  10/23/2021   INFLUENZA VACCINE  03/25/2024 (Originally 07/27/2023)   DTaP/Tdap/Td (4 - Td or Tdap) 12/27/2028   Colonoscopy  02/07/2029   HPV VACCINES  Aged Out    Patient Care Team: Alfredia Ferguson, PA-C as PCP - General (Physician Assistant) Marinus Maw, MD as PCP - Cardiology (Cardiology) Janalyn Harder, MD (Inactive) as Consulting Physician (Dermatology)  Review of Systems  Constitutional:  Positive for diaphoresis. Negative for fatigue and fever.  Respiratory:  Positive for cough. Negative for shortness of breath.   Cardiovascular:  Negative for  chest pain and leg swelling.  Gastrointestinal:  Negative for abdominal pain.  Neurological:  Negative for dizziness and headaches.        Objective    BP 136/80   Pulse 86   Temp 98 F (36.7 C)   Resp 18   Ht 5\' 5"  (1.651 m)   Wt 150 lb (68 kg)   SpO2 94%   BMI 24.96 kg/m     Physical Exam Constitutional:      General: She is awake.     Appearance: She is well-developed.  HENT:     Head: Normocephalic.  Eyes:     Conjunctiva/sclera: Conjunctivae normal.  Cardiovascular:     Rate and Rhythm: Normal rate and regular rhythm.     Heart sounds: Normal heart sounds.  Pulmonary:     Effort: Pulmonary effort is normal.     Breath sounds: Normal breath sounds.  Skin:    General: Skin is warm.     Comments: Pt has many flat and raised lesions on her chest, arms, back.  Based on what she showed me -- the most concerning is on her right collarbone, darker in color and per pt has grown. <1 cm, raised.  Neurological:     Mental Status: She is alert and oriented to person, place, and time.  Psychiatric:        Attention and Perception: Attention normal.        Mood and Affect: Mood normal.        Speech: Speech is tangential.        Behavior: Behavior is cooperative.    Depression Screen     No data to display         No results found for any visits on 02/20/24.  Assessment & Plan     Essential hypertension Assessment & Plan: Manages with coreg 12.5 mg BID, spirinolactone 25 mg daily. Well controlled. Ordered cmp F/u 6 mp  Orders: -     CBC with Differential/Platelet -     Comprehensive metabolic panel -     Spironolactone; Take 1 tablet (25 mg total) by mouth daily.  Dispense: 90 tablet; Refill: 1 -     CT CARDIAC SCORING (SELF PAY ONLY); Future  Multiple thyroid nodules Assessment & Plan: Repeat thyroid ultrasound  Orders: -     TSH -     T4, free -     US THYROID; Future  Chronic cough Gastroesophageal reflux disease, unspecified whether  esophagitis present Assessment & Plan: Causing chronic cough. Recommending pantoprazole daily. Ok to cont pepcid/tums prn  Orders: -     Pantoprazole Sodium; Take 1 tablet (40 mg total) by mouth daily. Take first thing in the morning on an empty stomach, wait 20 minutes before eating  Dispense: 90 tablet; Refill: 0  Chronic migraine without aura without status migrainosus, not intractable Assessment & Plan: Manages with topamax 25 mg daily  Takes sumatriptan for abortive migraine relief-- history of taking ubrelvy, and would prefer to use this.  Orders: -     CBC with Differential/Platelet -     Comprehensive metabolic panel Bernita Raisin; Take 1 tablet (50 mg total) by mouth as needed (headache).  Dispense: 9 tablet; Refill: 1  Change in multiple pigmented skin lesions -     Ambulatory referral  to Dermatology  Encounter for osteoporosis screening in asymptomatic postmenopausal patient -     DG Bone Density  Family history of heart disease -Per pt preference referring for CT calcium score.  Family history of breast cancer Family history of pancreatic cancer -     Ambulatory referral to Genetics  Hot flashes -manages with estrogen patch. She is s/p hysterectomy - Provide list of OTC menopause supplements - Discuss potential medication adjustments with psychiatrist ie SSRI/SNRI    Return in about 6 months (around 08/19/2024) for chronic conditions, CPE.      Alfredia Ferguson, PA-C  [facility] [phone] (phone) [phone] (fax)  [facility]

## 2024-02-20 NOTE — Assessment & Plan Note (Signed)
 Manages with topamax 25 mg daily  Takes sumatriptan for abortive migraine relief-- history of taking ubrelvy, and would prefer to use this.

## 2024-02-20 NOTE — Assessment & Plan Note (Signed)
Repeat thyroid ultrasound

## 2024-02-26 DIAGNOSIS — G4733 Obstructive sleep apnea (adult) (pediatric): Secondary | ICD-10-CM | POA: Diagnosis not present

## 2024-02-28 ENCOUNTER — Telehealth: Payer: Self-pay | Admitting: Genetic Counselor

## 2024-02-28 NOTE — Telephone Encounter (Signed)
 Left patient a voicemail in regards to scheduling for Genetics; left patient callback number if patient wishes to schedule

## 2024-02-29 DIAGNOSIS — E2839 Other primary ovarian failure: Secondary | ICD-10-CM | POA: Diagnosis not present

## 2024-02-29 DIAGNOSIS — N958 Other specified menopausal and perimenopausal disorders: Secondary | ICD-10-CM | POA: Diagnosis not present

## 2024-02-29 DIAGNOSIS — Z1231 Encounter for screening mammogram for malignant neoplasm of breast: Secondary | ICD-10-CM | POA: Diagnosis not present

## 2024-02-29 DIAGNOSIS — M8588 Other specified disorders of bone density and structure, other site: Secondary | ICD-10-CM | POA: Diagnosis not present

## 2024-02-29 LAB — HM DEXA SCAN

## 2024-03-01 ENCOUNTER — Ambulatory Visit
Admission: RE | Admit: 2024-03-01 | Discharge: 2024-03-01 | Disposition: A | Discharge status: Home / Self Care | Source: Ambulatory Visit | Attending: Physician Assistant | Admitting: Physician Assistant

## 2024-03-01 ENCOUNTER — Telehealth: Payer: Self-pay | Admitting: Internal Medicine

## 2024-03-01 ENCOUNTER — Telehealth: Payer: Self-pay | Admitting: Adult Health

## 2024-03-01 DIAGNOSIS — E041 Nontoxic single thyroid nodule: Secondary | ICD-10-CM | POA: Diagnosis not present

## 2024-03-01 DIAGNOSIS — E042 Nontoxic multinodular goiter: Secondary | ICD-10-CM

## 2024-03-01 NOTE — Telephone Encounter (Signed)
 LVM per DPR that patient has refills available already on all medications except for Adderall. She is on 2 different doses of Adderall and I don't know if she needs one dose or both. Not due for RF on 10 mg until 3/11, but 30 mg XR ok to fill now.

## 2024-03-01 NOTE — Telephone Encounter (Signed)
 Inbound call from patient returning phone call from 2/13 to discuss hemorrhoid banding. Please advise, thank you.

## 2024-03-01 NOTE — Telephone Encounter (Signed)
 Pt called at 10:39a requesting refills for the following medications:  Topomax, Lamictal, Adderall, Trazadone, Wellbutrin.  She is going to [place] on 3/11 and her refills will come due while she is out of the country.  Pharmacy is   [id number] [place], [place] - [address] AT [address] [place] [address] Phone: [phone]  Fax: [phone]   Next appt 8/18

## 2024-03-01 NOTE — Telephone Encounter (Signed)
 Sent MyChart message with this same information.

## 2024-03-04 ENCOUNTER — Inpatient Hospital Stay: Attending: Genetic Counselor | Admitting: Genetic Counselor

## 2024-03-04 ENCOUNTER — Encounter: Payer: Self-pay | Admitting: Genetic Counselor

## 2024-03-04 ENCOUNTER — Telehealth: Payer: Self-pay

## 2024-03-04 ENCOUNTER — Encounter: Payer: Self-pay | Admitting: Physician Assistant

## 2024-03-04 ENCOUNTER — Inpatient Hospital Stay

## 2024-03-04 ENCOUNTER — Other Ambulatory Visit: Payer: Self-pay | Admitting: Genetic Counselor

## 2024-03-04 ENCOUNTER — Other Ambulatory Visit: Payer: Self-pay

## 2024-03-04 ENCOUNTER — Other Ambulatory Visit (HOSPITAL_COMMUNITY): Payer: Self-pay

## 2024-03-04 DIAGNOSIS — Z860101 Personal history of adenomatous and serrated colon polyps: Secondary | ICD-10-CM | POA: Diagnosis not present

## 2024-03-04 DIAGNOSIS — D126 Benign neoplasm of colon, unspecified: Secondary | ICD-10-CM

## 2024-03-04 DIAGNOSIS — Z803 Family history of malignant neoplasm of breast: Secondary | ICD-10-CM | POA: Diagnosis not present

## 2024-03-04 DIAGNOSIS — Z800 Family history of malignant neoplasm of digestive organs: Secondary | ICD-10-CM

## 2024-03-04 DIAGNOSIS — F900 Attention-deficit hyperactivity disorder, predominantly inattentive type: Secondary | ICD-10-CM

## 2024-03-04 LAB — GENETIC SCREENING ORDER

## 2024-03-04 MED ORDER — AMPHETAMINE-DEXTROAMPHET ER 30 MG PO CP24: 30.0000 mg | ORAL_CAPSULE | Freq: Every day | ORAL | 0 refills | Status: DC

## 2024-03-04 NOTE — Progress Notes (Signed)
 REFERRING PROVIDER: Alfredia Ferguson, PA-C [address] [place],  [place] [address]  PRIMARY PROVIDER:  Alfredia Ferguson, PA-C  PRIMARY REASON FOR VISIT:  1. Family history of breast cancer   2. Family history of colon cancer   3. Family history of pancreatic cancer   4. Personal history of adenomatous and serrated colon polyps   5. Benign neoplasm of colon, unspecified part of colon      HISTORY OF PRESENT ILLNESS:   Tammy Randolph, a 63 y.o. female, was seen for a [place] cancer genetics consultation at the request of Dr. Ok Edwards due to a personal and family history of polyps, and family history of cancer.  Tammy Randolph presents to clinic today to discuss the possibility of a hereditary predisposition to cancer, genetic testing, and to further clarify her future cancer risks, as well as potential cancer risks for family members.   Tammy Randolph is a 63 y.o. female with no personal history of cancer.    CANCER HISTORY:  Oncology History   No history exists.     RISK FACTORS:  Menarche was at age 43.  First live birth at age 42.  OCP use for approximately  21-22  years.  Ovaries intact: one.  Hysterectomy: yes.  Menopausal status: postmenopausal.  HRT use: 7 years. Colonoscopy: yes; abnormal. Mammogram within the last year: yes. Number of breast biopsies: 0. Up to date with pelvic exams: yes. Any excessive radiation exposure in the past: no  Past Medical History:  Diagnosis Date   Adenomatous colon polyp 1996   Anxiety    ANXIETY 07/15/2008   Qualifier: Diagnosis of  By: Yetta Barre CMA, Chemira     Arthritis    Asthma    Bipolar disorder (HCC)    Chronic fatigue    Depression    Family history of breast cancer    Family history of colon cancer    Family history of pancreatic cancer    History of blood transfusion 2003   Hypertension    Personal history of adenomatous and serrated colon polyps    Pneumonia    couple of times last being last year   PONV  (postoperative nausea and vomiting)    Seasonal allergies    Spinal headache     Past Surgical History:  Procedure Laterality Date   ABDOMINAL HYSTERECTOMY  2004   and USO for endometriosis   ANTERIOR CERVICAL DECOMP/DISCECTOMY FUSION N/A 09/23/2015   Procedure: ANTERIOR CERVICAL DISCECTOMY FUSION C4-7     (3 LEVELS);  Surgeon: Venita Lick, MD;  Location: [facility] OR;  Service: Orthopedics;  Laterality: N/A;   APPENDECTOMY  1988   BREAST ENHANCEMENT SURGERY Bilateral 2008   COLONOSCOPY W/ POLYPECTOMY  2013   Dr Russella Dar   HYSTERECTOMY ABDOMINAL WITH SALPINGECTOMY     x4 for endometriosis   KNEE ARTHROSCOPY WITH ANTERIOR CRUCIATE LIGAMENT (ACL) REPAIR Right 01/26/2013   LUMBAR LAMINECTOMY  2008   TONSILLECTOMY  2009    Social History   Socioeconomic History   Marital status: Married    Spouse name: Not on file   Number of children: Not on file   Years of education: Not on file   Highest education level: Not on file  Occupational History   Not on file  Tobacco Use   Smoking status: Former    Current packs/day: 0.00    Types: Cigarettes    Quit date: 12/26/1986    Years since quitting: 37.2   Smokeless tobacco: Never  Tobacco comments:    up to 1& 1/2packs /week  Vaping Use   Vaping status: Never Used  Substance and Sexual Activity   Alcohol use: Yes    Alcohol/week: 0.0 standard drinks of alcohol    Comment: socially   Drug use: No   Sexual activity: Not on file  Other Topics Concern   Not on file  Social History Narrative   Not on file   Social Drivers of Health   Financial Resource Strain: Low Risk  (10/31/2021)   Received from [facility], [facility]   Overall Financial Resource Strain (CARDIA)    Difficulty of Paying Living Expenses: Not hard at all  Food Insecurity: No Food Insecurity (10/31/2021)   Received from [facility], [facility]   Hunger Vital Sign    Worried About Running Out of Food in the Last Year: Never true    Ran Out of Food in the Last  Year: Never true  Transportation Needs: No Transportation Needs (10/31/2021)   Received from [facility], [facility]   PRAPARE - Transportation    Lack of Transportation (Medical): No    Lack of Transportation (Non-Medical): No  Physical Activity: Insufficiently Active (10/31/2021)   Received from [facility], [facility]   Exercise Vital Sign    Days of Exercise per Week: 3 days    Minutes of Exercise per Session: 40 min  Stress: Stress Concern Present (10/31/2021)   Received from [facility], [facility] of Occupational Health - Occupational Stress Questionnaire    Feeling of Stress : To some extent  Social Connections: Unknown (05/06/2022)   Received from [facility], [facility]   Social Network    Social Network: Not on file     FAMILY HISTORY:  We obtained a detailed, 4-generation family history.  Significant diagnoses are listed below: Family History  Problem Relation Age of Onset   CVA Mother        x3   Hypertension Mother    Arthritis Mother    Breast cancer Mother        dx. 50s   Heart Problems Mother        pacemaker   Colon polyps Mother    Sleep apnea Father    CVA Father    Sleep apnea Sister    Hypertension Sister    Hypertension Sister    Colon polyps Sister        needed surgery to remove a large one   Colon polyps Brother    Clotting disorder Brother    Colon polyps Brother    Leukemia Maternal Uncle 9       CLL   Heart attack Maternal Uncle    Leukemia Maternal Uncle        CLL   Coronary artery disease Paternal Aunt    Stroke Paternal Uncle    Pancreatic cancer Paternal Uncle    Colon cancer Maternal Grandmother 66   Breast cancer Paternal Grandmother        dx. 36s   Lung cancer Paternal Grandfather      The patient has three children who are cancer free.  She has two brothers and two sisters and a paternal half sister.  All have had colon polyps.  Her parents are both deceased.  The patient's mother  reportedly had polyposis, and breast cancer in her 69's. She had three brothers, two had CLL.  Her mother died of colon cancer.  Most of these relatives  had colon polyps.  The patient's father died of a stroke at 28.  He had two brothers and a sister.  One brother had pancreatic cancer.  His father had lung cancer and mother had breast cancer.  Tammy Randolph is unaware of previous family history of genetic testing for hereditary cancer risks.  There is no reported Ashkenazi Jewish ancestry. There is no known consanguinity.  GENETIC COUNSELING ASSESSMENT: Tammy Randolph is a 63 y.o. female with a personal and family history of colon polyps and family history of cancer which is somewhat suggestive of a hereditary cancer syndrome and predisposition to cancer given the number of people with polyposis.. We, therefore, discussed and recommended the following at today's visit.   DISCUSSION: We discussed that, in general, most cancer is not inherited in families, but instead is sporadic or familial. Sporadic cancers occur by chance and typically happen at older ages (>50 years) as this type of cancer is caused by genetic changes acquired during an individual's lifetime. Some families have more cancers than would be expected by chance; however, the ages or types of cancer are not consistent with a known genetic mutation or known genetic mutations have been ruled out. This type of familial cancer is thought to be due to a combination of multiple genetic, environmental, hormonal, and lifestyle factors. While this combination of factors likely increases the risk of cancer, the exact source of this risk is not currently identifiable or testable.  We discussed that many people have colon polyps, with most having fewer than 5.  When an individual has 10 or more colon polyps it is called polyposis and can increase there risk for colon cancer.  There are several genes associated with hereditary polyposis.  These include APC,  MUTYH, BMPR1A and a few others.  We discussed that testing is beneficial for several reasons including knowing how to follow individuals and understand if other family members could be at risk for cancer and allow them to undergo genetic testing.   We reviewed the characteristics, features and inheritance patterns of hereditary cancer syndromes. We also discussed genetic testing, including the appropriate family members to test, the process of testing, insurance coverage and turn-around-time for results. We discussed the implications of a negative, positive, carrier and/or variant of uncertain significant result. Tammy Randolph  was offered a common hereditary cancer panel (36+ genes) and an expanded pan-cancer panel (70+ genes). Tammy Randolph was informed of the benefits and limitations of each panel, including that expanded pan-cancer panels contain genes that do not have clear management guidelines at this point in time.  We also discussed that as the number of genes included on a panel increases, the chances of variants of uncertain significance increases. Tammy Randolph decided to pursue genetic testing for the CancerNext-Expanded+RNA gene panel.   The CancerNext-Expanded gene panel offered by [facility] and includes sequencing, rearrangement, and RNA analysis for the following 76 genes: AIP, ALK, APC, ATM, AXIN2, BAP1, BARD1, BMPR1A, BRCA1, BRCA2, BRIP1, CDC73, CDH1, CDK4, CDKN1B, CDKN2A, CEBPA, CHEK2, CTNNA1, DDX41, DICER1, ETV6, FH, FLCN, GATA2, LZTR1, MAX, MBD4, MEN1, MET, MLH1, MSH2, MSH3, MSH6, MUTYH, NF1, NF2, NTHL1, PALB2, PHOX2B, PMS2, POT1, PRKAR1A, PTCH1, PTEN, RAD51C, RAD51D, RB1, RET, RUNX1, SDHA, SDHAF2, SDHB, SDHC, SDHD, SMAD4, SMARCA4, SMARCB1, SMARCE1, STK11, SUFU, TMEM127, TP53, TSC1, TSC2, VHL, and WT1 (sequencing and deletion/duplication); EGFR, HOXB13, KIT, MITF, PDGFRA, POLD1, and POLE (sequencing only); EPCAM and GREM1 (deletion/duplication only).    Based on Tammy Randolph's personal and  family history of polyposis and  family history of cancer, she meets medical criteria for genetic testing.  Despite that she meets criteria, she may still have an out of pocket cost. We discussed that if her out of pocket cost for testing is over $100, the laboratory will call and confirm whether she wants to proceed with testing.  If the out of pocket cost of testing is less than $100 she will be billed by the genetic testing laboratory.   We discussed that some people do not want to undergo genetic testing due to fear of genetic discrimination.  The Genetic Information Nondiscrimination Act (GINA) was signed into federal law in 2008. GINA prohibits health insurers and most employers from discriminating against individuals based on genetic information (including the results of genetic tests and family history information). According to GINA, health insurance companies cannot consider genetic information to be a preexisting condition, nor can they use it to make decisions regarding coverage or rates. GINA also makes it illegal for most employers to use genetic information in making decisions about hiring, firing, promotion, or terms of employment. It is important to note that GINA does not offer protections for life insurance, disability insurance, or long-term care insurance. GINA does not apply to those in the [employer], those who work for companies with less than 15 employees, and new life insurance or long-term disability insurance policies.  Health status due to a cancer diagnosis is not protected under GINA. More information about GINA can be found by visiting [email or web address].  PLAN: After considering the risks, benefits, and limitations, Tammy Randolph provided informed consent to pursue genetic testing and the blood sample was sent to [facility] for analysis of the CancerNext-Expanded+RNAinsight. Results should be available within approximately 2-3 weeks' time, at which point they will be  disclosed by telephone to Tammy Randolph, as will any additional recommendations warranted by these results. Tammy Randolph will receive a summary of her genetic counseling visit and a copy of her results once available. This information will also be available in Epic.   Lastly, we encouraged Tammy Randolph to remain in contact with cancer genetics annually so that we can continuously update the family history and inform her of any changes in cancer genetics and testing that may be of benefit for this family.   Tammy Randolph questions were answered to her satisfaction today. Our contact information was provided should additional questions or concerns arise. Thank you for the referral and allowing [place] to share in the care of your patient.   Dawnyel Leven P. Lowell Guitar, MS, CGC Licensed, Patent attorney [email or web address] phone: [phone]  60 minutes were spent on the date of the encounter in service to the patient including preparation, face-to-face consultation, documentation and care coordination.  The patient was seen alone.  Drs. Meliton Rattan, and/or [place] were available for questions, if needed..    _______________________________________________________________________ For Office Staff:  Number of people involved in session: 1 Was an Intern/ student involved with case: no

## 2024-03-04 NOTE — Telephone Encounter (Signed)
 Sent 30 mg Adderall to requested pharmacy

## 2024-03-04 NOTE — Telephone Encounter (Signed)
 Tammy Randolph called and LM at 10:38 requesting the refill of her 30mg  Adderall.  Send to [facility] on [address] [place].

## 2024-03-04 NOTE — Telephone Encounter (Signed)
 Second attempt called patient to schedule hemorrhoid banding no answer left message on voice mail to call office back to schedule banding.

## 2024-03-04 NOTE — Telephone Encounter (Signed)
 Pharmacy Patient Advocate Encounter   Received notification from CoverMyMeds that prior authorization for Ubrelvy 50MG  tablets is required/requested.   Insurance verification completed.   The patient is insured through [facility] .   Per test claim: PA required; PA submitted to above mentioned insurance via CoverMyMeds Key/confirmation #/EOC [id number] Status is pending

## 2024-03-04 NOTE — Telephone Encounter (Signed)
 Pharmacy Patient Advocate Encounter  Received notification from [facility] that Prior Authorization for Ubrelvy 50MG  tablets has been DENIED.  See denial reason below. No denial letter attached in CMM. Will attach denial letter to Media tab once received.   PA #/Case ID/Reference #: [id number]

## 2024-03-12 ENCOUNTER — Telehealth: Payer: Self-pay | Admitting: Emergency Medicine

## 2024-03-12 NOTE — Telephone Encounter (Signed)
 Copied from CRM [id number]. Topic: General - Other >> Mar 12, 2024  2:02 PM Truddie Crumble wrote: Reason for CRM: sydney from [place] orthodontist called stating they faxed a pre medication form for the patient on 3/5 and have not received it back CB [phone]

## 2024-03-13 NOTE — Telephone Encounter (Signed)
 Received fax and placed in provider's basket for signature.

## 2024-03-13 NOTE — Telephone Encounter (Signed)
 Spoke with [place] and requested she resend fax.

## 2024-03-19 ENCOUNTER — Encounter: Payer: Self-pay | Admitting: Genetic Counselor

## 2024-03-19 DIAGNOSIS — Z1379 Encounter for other screening for genetic and chromosomal anomalies: Secondary | ICD-10-CM | POA: Insufficient documentation

## 2024-03-20 ENCOUNTER — Other Ambulatory Visit: Payer: Self-pay | Admitting: Physician Assistant

## 2024-03-21 ENCOUNTER — Telehealth: Payer: Self-pay | Admitting: Genetic Counselor

## 2024-03-21 NOTE — Telephone Encounter (Signed)
 LM on VM that results are back and to please call.  Left CB instructions.

## 2024-03-25 NOTE — Telephone Encounter (Signed)
Left 2nd message on VM that results are back and to please call.  Left CB instructions. 

## 2024-03-27 ENCOUNTER — Ambulatory Visit: Payer: Self-pay | Admitting: Genetic Counselor

## 2024-03-27 DIAGNOSIS — Z1379 Encounter for other screening for genetic and chromosomal anomalies: Secondary | ICD-10-CM

## 2024-03-27 NOTE — Progress Notes (Signed)
 HPI:  [name] was previously seen in the [place] [facility] due to a personal history of colon polyps and family history of cancer and concerns regarding a hereditary predisposition to cancer. Please refer to our prior cancer genetics clinic note for more information regarding our discussion, assessment and recommendations, at the time. [name] recent genetic test results were disclosed to her, as were recommendations warranted by these results. These results and recommendations are discussed in more detail below.  CANCER HISTORY:  Oncology History   No history exists.    FAMILY HISTORY:  We obtained a detailed, 4-generation family history.  Significant diagnoses are listed below: Family History  Problem Relation Age of Onset   CVA Mother        x3   Hypertension Mother    Arthritis Mother    Breast cancer Mother        dx. 50s   Heart Problems Mother        pacemaker   Colon polyps Mother    Sleep apnea Father    CVA Father    Sleep apnea Sister    Hypertension Sister    Hypertension Sister    Colon polyps Sister        needed surgery to remove a large one   Colon polyps Brother    Clotting disorder Brother    Colon polyps Brother    Leukemia Maternal Uncle 26       CLL   Heart attack Maternal Uncle    Leukemia Maternal Uncle        CLL   Coronary artery disease Paternal Aunt    Stroke Paternal Uncle    Pancreatic cancer Paternal Uncle    Colon cancer Maternal Grandmother 12   Breast cancer Paternal Grandmother        dx. 22s   Lung cancer Paternal Grandfather        The patient has three children who are cancer free.  She has two brothers and two sisters and a paternal half sister.  All have had colon polyps.  Her parents are both deceased.   The patient's mother reportedly had polyposis, and breast cancer in her 51's. She had three brothers, two had CLL.  Her mother died of colon cancer.  Most of these relatives had colon polyps.   The  patient's father died of a stroke at 20.  He had two brothers and a sister.  One brother had pancreatic cancer.  His father had lung cancer and mother had breast cancer.   [name] is unaware of previous family history of genetic testing for hereditary cancer risks.  There is no reported Ashkenazi Jewish ancestry. There is no known consanguinity.  GENETIC TEST RESULTS: Genetic testing reported out on March 18, 2024 through the CancerNext-Expanded+RNAinsight cancer panel found no pathogenic mutations. The CancerNext-Expanded gene panel offered by [facility] and includes sequencing, rearrangement, and RNA analysis for the following 76 genes: AIP, ALK, APC, ATM, AXIN2, BAP1, BARD1, BMPR1A, BRCA1, BRCA2, BRIP1, CDC73, CDH1, CDK4, CDKN1B, CDKN2A, CEBPA, CHEK2, CTNNA1, DDX41, DICER1, ETV6, FH, FLCN, GATA2, LZTR1, MAX, MBD4, MEN1, MET, MLH1, MSH2, MSH3, MSH6, MUTYH, NF1, NF2, NTHL1, PALB2, PHOX2B, PMS2, POT1, PRKAR1A, PTCH1, PTEN, RAD51C, RAD51D, RB1, RET, RUNX1, SDHA, SDHAF2, SDHB, SDHC, SDHD, SMAD4, SMARCA4, SMARCB1, SMARCE1, STK11, SUFU, TMEM127, TP53, TSC1, TSC2, VHL, and WT1 (sequencing and deletion/duplication); EGFR, HOXB13, KIT, MITF, PDGFRA, POLD1, and POLE (sequencing only); EPCAM and GREM1 (deletion/duplication only). The test report has been scanned into  EPIC and is located under the Molecular Pathology section of the Results Review tab.  A portion of the result report is included below for reference.     We discussed with [name] that because current genetic testing is not perfect, it is possible there may be a gene mutation in one of these genes that current testing cannot detect, but that chance is small.  We also discussed, that there could be another gene that has not yet been discovered, or that we have not yet tested, that is responsible for the cancer diagnoses in the family. It is also possible there is a hereditary cause for the cancer in the family that [name] did not inherit  and therefore was not identified in her testing.  Therefore, it is important to remain in touch with cancer genetics in the future so that we can continue to offer [name] the most up to date genetic testing.   ADDITIONAL GENETIC TESTING: We discussed with [name] that her genetic testing was fairly extensive.  If there are genes identified to increase cancer risk that can be analyzed in the future, we would be happy to discuss and coordinate this testing at that time.    CANCER SCREENING RECOMMENDATIONS: [name] test result is considered negative (normal).  This means that we have not identified a hereditary cause for her personal history of colon polyps and family history of cancer at this time. Most cancers happen by chance and this negative test suggests that her personal history of colon polyps may fall into this category.    Possible reasons for [name]'s negative genetic test include:  1. There may be a gene mutation in one of these genes that current testing methods cannot detect but that chance is small.  2. There could be another gene that has not yet been discovered, or that we have not yet tested, that is responsible for the cancer diagnoses in the family.  3.  There may be no hereditary risk for cancer in the family. The cancers in [name] and/or her family may be sporadic/familial or due to other genetic and environmental factors. 4. It is also possible there is a hereditary cause for the cancer in the family that [name] did not inherit.  Therefore, it is recommended she continue to follow the cancer management and screening guidelines provided by her primary healthcare provider. An individual's cancer risk and medical management are not determined by genetic test results alone. Overall cancer risk assessment incorporates additional factors, including personal medical history, family history, and any available genetic information that may result in a personalized  plan for cancer prevention and surveillance  This negative genetic test simply tells [place] that we cannot yet define why [name] has had an increased number of colorectal polyps. [name] medical management and screening should be based on the prospect that she will likely form more colon polyps in the future and should, therefore, undergo more frequent colonoscopy screening at intervals determined by her GI providers.  We also recommended that [name] have an upper endoscopy periodically.  RECOMMENDATIONS FOR FAMILY MEMBERS:   Since she did not inherit a identifiable mutation in a cancer predisposition gene included on this panel, her children could not have inherited a known mutation from her in one of these genes. Individuals in this family might be at some increased risk of developing cancer, over the general population risk, simply due to the family history of cancer.  We  recommended women in this family have a yearly mammogram beginning at age 47, or 34 years younger than the earliest onset of cancer, an annual clinical breast exam, and perform monthly breast self-exams. Women in this family should also have a gynecological exam as recommended by their primary provider. All family members should be referred for colonoscopy starting at age 23, or 52 years younger than the earliest onset of cancer. It is also possible there is a hereditary cause for the cancer in [name]'s family that she did not inherit and therefore was not identified in her.  Based on [name]'s family history, we recommended her family members who have a diagnosis of cancer have genetic counseling and testing. [name] will let us know if we can be of any assistance in coordinating genetic counseling and/or testing for this family member.   FOLLOW-UP: Lastly, we discussed with [name] that cancer genetics is a rapidly advancing field and it is possible that new genetic tests will be appropriate for her and/or  her family members in the future. We encouraged her to remain in contact with cancer genetics on an annual basis so we can update her personal and family histories and let her know of advances in cancer genetics that may benefit this family.   Our contact number was provided. [name] questions were answered to her satisfaction, and she knows she is welcome to call us at anytime with additional questions or concerns.   Maylon Cos, MS, [facility] Licensed, Certified Genetic Counselor [email or web address]

## 2024-03-27 NOTE — Telephone Encounter (Signed)
Revealed negative genetic testing.  Discussed that we do not know why she has polyposis or why there is cancer in the family. It could be due to a different gene that we are not testing, or maybe our current technology may not be able to pick something up.  It will be important for her to keep in contact with genetics to keep up with whether additional testing may be needed.   

## 2024-03-27 NOTE — Telephone Encounter (Signed)
 Patient returning phone call. Please advise, thank you.

## 2024-03-28 DIAGNOSIS — G4733 Obstructive sleep apnea (adult) (pediatric): Secondary | ICD-10-CM | POA: Diagnosis not present

## 2024-04-11 ENCOUNTER — Encounter: Payer: Self-pay | Admitting: Dermatology

## 2024-04-11 ENCOUNTER — Ambulatory Visit: Admitting: Dermatology

## 2024-04-11 VITALS — BP 134/88 | HR 85

## 2024-04-11 DIAGNOSIS — Z1283 Encounter for screening for malignant neoplasm of skin: Secondary | ICD-10-CM

## 2024-04-11 DIAGNOSIS — W908XXA Exposure to other nonionizing radiation, initial encounter: Secondary | ICD-10-CM

## 2024-04-11 DIAGNOSIS — L570 Actinic keratosis: Secondary | ICD-10-CM

## 2024-04-11 DIAGNOSIS — D1801 Hemangioma of skin and subcutaneous tissue: Secondary | ICD-10-CM

## 2024-04-11 DIAGNOSIS — L578 Other skin changes due to chronic exposure to nonionizing radiation: Secondary | ICD-10-CM

## 2024-04-11 DIAGNOSIS — L821 Other seborrheic keratosis: Secondary | ICD-10-CM | POA: Diagnosis not present

## 2024-04-11 DIAGNOSIS — C4491 Basal cell carcinoma of skin, unspecified: Secondary | ICD-10-CM

## 2024-04-11 DIAGNOSIS — C44519 Basal cell carcinoma of skin of other part of trunk: Secondary | ICD-10-CM | POA: Diagnosis not present

## 2024-04-11 DIAGNOSIS — L814 Other melanin hyperpigmentation: Secondary | ICD-10-CM

## 2024-04-11 DIAGNOSIS — Z85828 Personal history of other malignant neoplasm of skin: Secondary | ICD-10-CM

## 2024-04-11 DIAGNOSIS — D229 Melanocytic nevi, unspecified: Secondary | ICD-10-CM

## 2024-04-11 NOTE — Patient Instructions (Addendum)

## 2024-04-11 NOTE — Progress Notes (Signed)
 New Patient Visit   Subjective  Tammy Randolph is a 63 y.o. female who presents for the following: Skin Cancer Screening and Full Body Skin Exam  The patient presents for Total-Body Skin Exam (TBSE) for skin cancer screening and mole check. The patient has spots, moles and lesions to be evaluated, some may be new or changing.  Pt has hx of BCC on her back around 1 1/2 years ago biopsied by Dr. Jorja Loa but never treated.   The following portions of the chart were reviewed this encounter and updated as appropriate: medications, allergies, medical history  Review of Systems:  No other skin or systemic complaints except as noted in HPI or Assessment and Plan.  Objective  Well appearing patient in no apparent distress; mood and affect are within normal limits.  A full examination was performed including scalp, head, eyes, ears, nose, lips, neck, chest, axillae, abdomen, back, buttocks, bilateral upper extremities, bilateral lower extremities, hands, feet, fingers, toes, fingernails, and toenails. All findings within normal limits unless otherwise noted below.   Relevant physical exam findings are noted in the Assessment and Plan.    Assessment & Plan   SKIN CANCER SCREENING PERFORMED TODAY.  ACTINIC DAMAGE - Chronic condition, secondary to cumulative UV/sun exposure - diffuse scaly erythematous macules with underlying dyspigmentation - Recommend daily broad spectrum sunscreen SPF 30+ to sun-exposed areas, reapply every 2 hours as needed.  - Staying in the shade or wearing long sleeves, sun glasses (UVA+UVB protection) and wide brim hats (4-inch brim around the entire circumference of the hat) are also recommended for sun protection.  - Call for new or changing lesions.  MELANOCYTIC NEVI - Tan-brown and/or pink-flesh-colored symmetric macules and papules - Benign appearing on exam today - Observation - Call clinic for new or changing moles - Recommend daily use of broad spectrum  spf 30+ sunscreen to sun-exposed areas.   LENTIGINES Exam: scattered tan macules Due to sun exposure Treatment Plan: Benign-appearing, observe. Recommend daily broad spectrum sunscreen SPF 30+ to sun-exposed areas, reapply every 2 hours as needed.  Call for any changes   HEMANGIOMA Exam: red papule(s) Discussed benign nature. Recommend observation. Call for changes.   SEBORRHEIC KERATOSIS - Stuck-on, waxy, tan-brown papules and/or plaques  - Benign-appearing - Discussed benign etiology and prognosis. - Observe - Call for any changes  HISTORY OF BASAL CELL CARCINOMA OF THE SKIN - No evidence of recurrence today - Recommend regular full body skin exams - Recommend daily broad spectrum sunscreen SPF 30+ to sun-exposed areas, reapply every 2 hours as needed.  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 Jorja Loa; never treated - will schedule for [facility]    ACTINIC KERATOSIS Exam: Erythematous thin papules/macules with gritty scale  Actinic keratoses are precancerous spots that appear secondary to cumulative UV radiation exposure/sun exposure over time. They are chronic with expected duration over 1 year. A portion of actinic keratoses will progress to squamous cell carcinoma of the skin. It is not possible to reliably predict which spots will progress to skin cancer and so treatment is recommended to prevent development of skin cancer.  Recommend daily broad spectrum sunscreen SPF 30+ to sun-exposed areas, reapply every 2 hours as needed.  Recommend staying in the shade or wearing long sleeves, sun glasses (UVA+UVB protection) and wide brim hats (4-inch brim around the entire circumference of the hat). Call for  new or changing lesions.  Treatment Plan:  Prior to procedure, discussed risks of blister formation, small wound, skin dyspigmentation, or rare scar following cryotherapy.  Recommend Vaseline ointment to treated areas while healing.  Destruction Procedure Note Destruction method: cryotherapy   Informed consent: discussed and consent obtained   Lesion destroyed using liquid nitrogen: Yes   Outcome: patient tolerated procedure well with no complications   Post-procedure details: wound care instructions given   See below  AK (ACTINIC KERATOSIS) (7) Chest - Medial (Center), Left Breast, Left Forearm - Posterior, Mid Back, Mid Frontal Scalp, Mid Upper [place]ilion Lip, Right Breast Destruction of lesion - Chest - Medial (Center), Left Breast, Left Forearm - Posterior, Mid Back, Mid Frontal Scalp, Mid Upper [place] Lip, Right Breast Complexity: extensive   Destruction method: cryotherapy   Informed consent: discussed and consent obtained   Timeout:  patient name, date of birth, surgical site, and procedure verified Lesion destroyed using liquid nitrogen: Yes   Region frozen until ice ball extended beyond lesion: Yes   Cryotherapy cycles:  2 Outcome: patient tolerated procedure well with no complications   Post-procedure details: wound care instructions given   BASAL CELL CARCINOMA (BCC), UNSPECIFIED SITE Mid Back Return for excision.  Outside path SEBORRHEIC KERATOSES   CHERRY ANGIOMA   LENTIGINES   MULTIPLE BENIGN NEVI   ACTINIC SKIN DAMAGE    Return in 6 weeks (on 05/23/2024) for Excision for BCC.  I, Wilson Hasten, CMA, am acting as scribe for Deneise Finlay, MD.   Documentation: I have reviewed the above documentation for accuracy and completeness, and I agree with the above.  Deneise Finlay, MD

## 2024-04-26 ENCOUNTER — Telehealth: Payer: Self-pay | Admitting: Adult Health

## 2024-04-26 NOTE — Telephone Encounter (Signed)
 Pt  Lvm @ 1:23p stating that she is leaving the country from 5/8 to 5/28.  Her Adderall scripts are due 5/11.  She is asking for the Adderall XR 30mg  and Adderall 10mg  to be sent to   [id number] - [place], [place] - [address] AT [facility] [address], [place] [address] Phone: [phone]  Fax: [phone]   Next appt 8/18

## 2024-04-27 DIAGNOSIS — G4733 Obstructive sleep apnea (adult) (pediatric): Secondary | ICD-10-CM | POA: Diagnosis not present

## 2024-04-29 ENCOUNTER — Other Ambulatory Visit: Payer: Self-pay

## 2024-04-29 DIAGNOSIS — F900 Attention-deficit hyperactivity disorder, predominantly inattentive type: Secondary | ICD-10-CM

## 2024-04-29 MED ORDER — AMPHETAMINE-DEXTROAMPHET ER 30 MG PO CP24: 30.0000 mg | ORAL_CAPSULE | Freq: Every day | ORAL | 0 refills | Status: DC

## 2024-04-29 MED ORDER — AMPHETAMINE-DEXTROAMPHETAMINE 10 MG PO TABS: 10.0000 mg | ORAL_TABLET | Freq: Every day | ORAL | 0 refills | Status: DC

## 2024-04-29 NOTE — Telephone Encounter (Signed)
 Pended both Adderall doses to [facility] #[id number]

## 2024-05-03 ENCOUNTER — Telehealth: Payer: Self-pay | Admitting: Adult Health

## 2024-05-03 ENCOUNTER — Other Ambulatory Visit: Payer: Self-pay | Admitting: Adult Health

## 2024-05-03 DIAGNOSIS — G4700 Insomnia, unspecified: Secondary | ICD-10-CM

## 2024-05-03 DIAGNOSIS — F3181 Bipolar II disorder: Secondary | ICD-10-CM

## 2024-05-03 MED ORDER — TRAZODONE HCL 100 MG PO TABS: 100.0000 mg | ORAL_TABLET | Freq: Every day | ORAL | 0 refills | Status: DC

## 2024-05-03 MED ORDER — LAMOTRIGINE 150 MG PO TABS: 150.0000 mg | ORAL_TABLET | Freq: Every day | ORAL | 0 refills | Status: DC

## 2024-05-03 NOTE — Telephone Encounter (Signed)
 Address given is for [facility] not [facility]. RF for both Lamictal  and trazodone  sent.

## 2024-05-03 NOTE — Telephone Encounter (Signed)
 Pt called at 1:27p stating she her grandbaby was born prematurely last night so she will be leaving to go out of town tonight to [place] to visit her daughter and grandbaby.  She is asking to have refill of Lamictal  [facility]  [address], [place], [place] [address].  Next appt 8/18

## 2024-05-09 ENCOUNTER — Ambulatory Visit: Payer: BC Managed Care – PPO | Admitting: Family Medicine

## 2024-05-16 ENCOUNTER — Other Ambulatory Visit: Payer: Self-pay | Admitting: Physician Assistant

## 2024-05-16 DIAGNOSIS — R053 Chronic cough: Secondary | ICD-10-CM

## 2024-05-16 DIAGNOSIS — K219 Gastro-esophageal reflux disease without esophagitis: Secondary | ICD-10-CM

## 2024-05-28 ENCOUNTER — Other Ambulatory Visit: Payer: Self-pay | Admitting: Adult Health

## 2024-05-28 DIAGNOSIS — G4733 Obstructive sleep apnea (adult) (pediatric): Secondary | ICD-10-CM | POA: Diagnosis not present

## 2024-05-28 DIAGNOSIS — G4700 Insomnia, unspecified: Secondary | ICD-10-CM

## 2024-06-04 ENCOUNTER — Encounter: Admitting: Internal Medicine

## 2024-06-05 ENCOUNTER — Other Ambulatory Visit: Payer: Self-pay | Admitting: Physician Assistant

## 2024-06-05 DIAGNOSIS — R519 Headache, unspecified: Secondary | ICD-10-CM | POA: Diagnosis not present

## 2024-06-05 DIAGNOSIS — R051 Acute cough: Secondary | ICD-10-CM | POA: Diagnosis not present

## 2024-06-05 DIAGNOSIS — R0981 Nasal congestion: Secondary | ICD-10-CM | POA: Diagnosis not present

## 2024-06-05 DIAGNOSIS — J029 Acute pharyngitis, unspecified: Secondary | ICD-10-CM | POA: Diagnosis not present

## 2024-06-05 MED ORDER — CARVEDILOL 12.5 MG PO TABS: 12.5000 mg | ORAL_TABLET | Freq: Two times a day (BID) | ORAL | 0 refills | Status: DC

## 2024-06-05 NOTE — Telephone Encounter (Signed)
 Copied from CRM [id number]. Topic: Clinical - Medication Refill >> Jun 05, 2024 11:58 AM Clyde Darling P wrote: Medication: carvedilol  (COREG ) 12.5 MG tablet  Has the patient contacted their pharmacy? Yes- [facility] refills  (Agent: If no, request that the patient contact the pharmacy for the refill. If patient does not wish to contact the pharmacy document the reason why and proceed with request.) (Agent: If yes, when and what did the pharmacy advise?)  This is the patient's preferred pharmacy:  [facility] #[id number] - [place], [place] - [address] AT [facility] [address] & [address] [place] [address] Phone: [phone] Fax: [phone]   Is this the correct pharmacy for this prescription? Yes If no, delete pharmacy and type the correct one.   Has the prescription been filled recently? No  Is the patient out of the medication? Dawn Eth been for 2 months  Has the patient been seen for an appointment in the last year OR does the patient have an upcoming appointment? Yes  Can we respond through MyChart? Yes  Agent: Please be advised that Rx refills may take up to 3 business days. We ask that you follow-up with your pharmacy.

## 2024-06-12 DIAGNOSIS — R269 Unspecified abnormalities of gait and mobility: Secondary | ICD-10-CM | POA: Diagnosis not present

## 2024-06-12 DIAGNOSIS — M79604 Pain in right leg: Secondary | ICD-10-CM | POA: Diagnosis not present

## 2024-06-12 DIAGNOSIS — M545 Low back pain, unspecified: Secondary | ICD-10-CM | POA: Diagnosis not present

## 2024-06-13 ENCOUNTER — Other Ambulatory Visit (HOSPITAL_COMMUNITY): Payer: Self-pay

## 2024-06-13 ENCOUNTER — Telehealth: Payer: Self-pay | Admitting: Pharmacist

## 2024-06-13 NOTE — Telephone Encounter (Signed)
 Ubrelvy  has been approved by the patient's insurance through 09/05/2024:

## 2024-06-18 NOTE — Telephone Encounter (Signed)
 LMOM notifying pt of rx approval.

## 2024-06-19 ENCOUNTER — Telehealth: Payer: Self-pay | Admitting: Adult Health

## 2024-06-19 ENCOUNTER — Other Ambulatory Visit: Payer: Self-pay

## 2024-06-19 DIAGNOSIS — F900 Attention-deficit hyperactivity disorder, predominantly inattentive type: Secondary | ICD-10-CM

## 2024-06-19 MED ORDER — AMPHETAMINE-DEXTROAMPHETAMINE 10 MG PO TABS
10.0000 mg | ORAL_TABLET | Freq: Every day | ORAL | 0 refills | Status: DC
Start: 2024-07-17 — End: 2024-09-16

## 2024-06-19 MED ORDER — AMPHETAMINE-DEXTROAMPHETAMINE 10 MG PO TABS: 10.0000 mg | ORAL_TABLET | Freq: Every day | ORAL | 0 refills | Status: DC

## 2024-06-19 NOTE — Telephone Encounter (Signed)
 Pended Adderall 10 mg to [facility]

## 2024-06-19 NOTE — Telephone Encounter (Signed)
 Next visit is 08/12/24. Tammy Randolph is requesting a refill on her Adderall 10 mg called to:  [facility] #[id number] - [place], [place] - [address] AT [facility] [address]   Phone: [phone]  Fax: [phone]

## 2024-06-26 ENCOUNTER — Telehealth: Payer: Self-pay | Admitting: Adult Health

## 2024-06-26 ENCOUNTER — Other Ambulatory Visit: Payer: Self-pay

## 2024-06-26 DIAGNOSIS — F900 Attention-deficit hyperactivity disorder, predominantly inattentive type: Secondary | ICD-10-CM

## 2024-06-26 MED ORDER — AMPHETAMINE-DEXTROAMPHET ER 30 MG PO CP24: 30.0000 mg | ORAL_CAPSULE | Freq: Every day | ORAL | 0 refills | Status: DC

## 2024-06-26 NOTE — Telephone Encounter (Signed)
 Pt called at 9:47a requesting refill on Adderall 30mg .  She said it should have been filled on the same day as the 10mg .  Pls send to   [facility] #[id number] - [place], [place] - [address] AT [facility] [address] [facility] [address] [address] & [address], [place] [address] Phone: [phone]  Fax: [phone]   Next appt 8/18

## 2024-06-26 NOTE — Telephone Encounter (Signed)
 RF for Adderall 30 mg sent.  Previous message only requested 10 mg.

## 2024-06-27 DIAGNOSIS — G4733 Obstructive sleep apnea (adult) (pediatric): Secondary | ICD-10-CM | POA: Diagnosis not present

## 2024-06-30 DIAGNOSIS — B9689 Other specified bacterial agents as the cause of diseases classified elsewhere: Secondary | ICD-10-CM | POA: Diagnosis not present

## 2024-06-30 DIAGNOSIS — J329 Chronic sinusitis, unspecified: Secondary | ICD-10-CM | POA: Diagnosis not present

## 2024-06-30 DIAGNOSIS — H6991 Unspecified Eustachian tube disorder, right ear: Secondary | ICD-10-CM | POA: Diagnosis not present

## 2024-07-09 ENCOUNTER — Encounter: Admitting: Internal Medicine

## 2024-07-17 DIAGNOSIS — K219 Gastro-esophageal reflux disease without esophagitis: Secondary | ICD-10-CM | POA: Diagnosis not present

## 2024-07-17 DIAGNOSIS — M26621 Arthralgia of right temporomandibular joint: Secondary | ICD-10-CM | POA: Diagnosis not present

## 2024-07-17 DIAGNOSIS — J324 Chronic pansinusitis: Secondary | ICD-10-CM | POA: Diagnosis not present

## 2024-07-17 DIAGNOSIS — J3089 Other allergic rhinitis: Secondary | ICD-10-CM | POA: Diagnosis not present

## 2024-07-24 ENCOUNTER — Ambulatory Visit: Admitting: Internal Medicine

## 2024-07-24 ENCOUNTER — Encounter: Payer: Self-pay | Admitting: Internal Medicine

## 2024-07-24 VITALS — BP 140/80 | HR 77 | Ht 64.0 in | Wt 140.0 lb

## 2024-07-24 DIAGNOSIS — K219 Gastro-esophageal reflux disease without esophagitis: Secondary | ICD-10-CM

## 2024-07-24 DIAGNOSIS — K641 Second degree hemorrhoids: Secondary | ICD-10-CM

## 2024-07-24 DIAGNOSIS — R1310 Dysphagia, unspecified: Secondary | ICD-10-CM

## 2024-07-24 DIAGNOSIS — K648 Other hemorrhoids: Secondary | ICD-10-CM

## 2024-07-24 NOTE — Progress Notes (Signed)
 PROCEDURE NOTE: The patient presents with symptomatic grade 2 hemorrhoids, requesting rubber band ligation of his/her hemorrhoidal disease.  All risks, benefits and alternative forms of therapy were described and informed consent was obtained.  Patient was recently told by ENT that she would need an EGD since she was noted to have signs of GERD (LPR) on fiberoptic upper airway evaluation. Patient does have a cough when she lays down and some dysphagia in her throat.  In the Left Lateral Decubitus position anoscopic examination revealed grade 2 hemorrhoids The anorectum was pre-medicated with nitroglycerin and Recticare The decision was made to band the posterior internal hemorrhoid, and the [facility] O'Regan System was used to perform band ligation without complication.  Digital anorectal examination was then performed to assure proper positioning of the band, and to adjust the banded tissue as required.  The patient was discharged home without pain or other issues.  Dietary and behavioral recommendations were given and along with follow-up instructions.     The following adjunctive treatments were recommended: Try to stay well hydrated and daily fiber supplement Will get scheduled for EGD to further evaluate GERD and dysphagia  The patient will return in 2-3 weeks for  follow-up and possible additional banding as required. No complications were encountered and the patient tolerated the procedure well.

## 2024-07-24 NOTE — Patient Instructions (Addendum)
 You have been scheduled for an endoscopy. Please follow written instructions given to you at your visit today.  If you use inhalers (even only as needed), please bring them with you on the day of your procedure.  If you take any of the following medications, they will need to be adjusted prior to your procedure:   DO NOT TAKE 7 DAYS PRIOR TO TEST- Trulicity (dulaglutide) Ozempic, Wegovy (semaglutide) Mounjaro (tirzepatide) Bydureon Bcise (exanatide extended release)  DO NOT TAKE 1 DAY PRIOR TO YOUR TEST Rybelsus (semaglutide) Adlyxin (lixisenatide) Victoza (liraglutide) Byetta (exanatide) ___________________________________________________________________________   HEMORRHOID BANDING PROCEDURE    FOLLOW-UP CARE   The procedure you have had should have been relatively painless since the banding of the area involved does not have nerve endings and there is no pain sensation.  The rubber band cuts off the blood supply to the hemorrhoid and the band may fall off as soon as 48 hours after the banding (the band may occasionally be seen in the toilet bowl following a bowel movement). You may notice a temporary feeling of fullness in the rectum which should respond adequately to plain Tylenol  or Motrin.  Following the banding, avoid strenuous exercise that evening and resume full activity the next day.  A sitz bath (soaking in a warm tub) or bidet is soothing, and can be useful for cleansing the area after bowel movements.     To avoid constipation, take two tablespoons of natural wheat bran, natural oat bran, flax, Benefiber or any over the counter fiber supplement and increase your water intake to 7-8 glasses daily.    Unless you have been prescribed anorectal medication, do not put anything inside your rectum for two weeks: No suppositories, enemas, fingers, etc.  Occasionally, you may have more bleeding than usual after the banding procedure.  This is often from the untreated  hemorrhoids rather than the treated one.  Don't be concerned if there is a tablespoon or so of blood.  If there is more blood than this, lie flat with your bottom higher than your head and apply an ice pack to the area. If the bleeding does not stop within a half an hour or if you feel faint, call our office at [phone] or go to the emergency room.  Problems are not common; however, if there is a substantial amount of bleeding, severe pain, chills, fever or difficulty passing urine (very rare) or other problems, you should call us  at [phone] or report to the nearest emergency room.  Do not stay seated continuously for more than 2-3 hours for a day or two after the procedure.  Tighten your buttock muscles 10-15 times every two hours and take 10-15 deep breaths every 1-2 hours.  Do not spend more than a few minutes on the toilet if you cannot empty your bowel; instead re-visit the toilet at a later time.     _______________________________________________________  If your blood pressure at your visit was 140/90 or greater, please contact your primary care physician to follow up on this.  _______________________________________________________  If you are age 37 or older, your body mass index should be between 23-30. Your Body mass index is 24.03 kg/m. If this is out of the aforementioned range listed, please consider follow up with your Primary Care Provider.  If you are age 70 or younger, your body mass index should be between 19-25. Your Body mass index is 24.03 kg/m. If this is out of the aformentioned range listed, please  consider follow up with your Primary Care Provider.   ________________________________________________________  The [facility] providers would like to encourage you to use MYCHART to communicate with providers for non-urgent requests or questions.  Due to long hold times on the telephone, sending your provider a message by [facility] may be a faster and more  efficient way to get a response.  Please allow 48 business hours for a response.  Please remember that this is for non-urgent requests.  _______________________________________________________  [facility] is using a team-based approach to care.  Your team is made up of your doctor and two to three APPS. Our APPS (Nurse Practitioners and Physician Assistants) work with your physician to ensure care continuity for you. They are fully qualified to address your health concerns and develop a treatment plan. They communicate directly with your gastroenterologist to care for you. Seeing the Advanced Practice Practitioners on your physician's team can help you by facilitating care more promptly, often allowing for earlier appointments, access to diagnostic testing, procedures, and other specialty referrals.    It was a pleasure to see you today!  Thank you for trusting me with your gastrointestinal care!

## 2024-08-08 ENCOUNTER — Other Ambulatory Visit: Payer: Self-pay

## 2024-08-08 DIAGNOSIS — F3181 Bipolar II disorder: Secondary | ICD-10-CM

## 2024-08-08 MED ORDER — LAMOTRIGINE 150 MG PO TABS: 150.0000 mg | ORAL_TABLET | Freq: Every day | ORAL | 0 refills | Status: DC

## 2024-08-09 ENCOUNTER — Ambulatory Visit: Admitting: Internal Medicine

## 2024-08-09 ENCOUNTER — Encounter: Payer: Self-pay | Admitting: Internal Medicine

## 2024-08-09 VITALS — BP 122/70 | HR 72 | Ht 64.0 in | Wt 141.0 lb

## 2024-08-09 DIAGNOSIS — K648 Other hemorrhoids: Secondary | ICD-10-CM

## 2024-08-09 DIAGNOSIS — K641 Second degree hemorrhoids: Secondary | ICD-10-CM

## 2024-08-09 NOTE — Progress Notes (Signed)
 PROCEDURE NOTE: The patient presents with symptomatic grade 2 hemorrhoids, requesting rubber band ligation of his/her hemorrhoidal disease.  All risks, benefits and alternative forms of therapy were described and informed consent was obtained.  In the Left Lateral Decubitus position anoscopic examination revealed grade 2 hemorrhoids The anorectum was pre-medicated with Recticare and nitroglycerin The decision was made to band the right anterior internal hemorrhoid, and the [facility] O'Regan System was used to perform band ligation without complication.  Digital anorectal examination was then performed to assure proper positioning of the band, and to adjust the banded tissue as required.  The patient was discharged home without pain or other issues.  Dietary and behavioral recommendations were given and along with follow-up instructions.    The patient will return in 2-3 weeks for follow-up and possible additional banding as required. No complications were encountered and the patient tolerated the procedure well.

## 2024-08-09 NOTE — Patient Instructions (Addendum)
 HEMORRHOID BANDING PROCEDURE    FOLLOW-UP CARE   The procedure you have had should have been relatively painless since the banding of the area involved does not have nerve endings and there is no pain sensation.  The rubber band cuts off the blood supply to the hemorrhoid and the band may fall off as soon as 48 hours after the banding (the band may occasionally be seen in the toilet bowl following a bowel movement). You may notice a temporary feeling of fullness in the rectum which should respond adequately to plain Tylenol  or Motrin.  Following the banding, avoid strenuous exercise that evening and resume full activity the next day.  A sitz bath (soaking in a warm tub) or bidet is soothing, and can be useful for cleansing the area after bowel movements.     To avoid constipation, take two tablespoons of natural wheat bran, natural oat bran, flax, Benefiber or any over the counter fiber supplement and increase your water intake to 7-8 glasses daily.    Unless you have been prescribed anorectal medication, do not put anything inside your rectum for two weeks: No suppositories, enemas, fingers, etc.  Occasionally, you may have more bleeding than usual after the banding procedure.  This is often from the untreated hemorrhoids rather than the treated one.  Don't be concerned if there is a tablespoon or so of blood.  If there is more blood than this, lie flat with your bottom higher than your head and apply an ice pack to the area. If the bleeding does not stop within a half an hour or if you feel faint, call our office at [phone] or go to the emergency room.  Problems are not common; however, if there is a substantial amount of bleeding, severe pain, chills, fever or difficulty passing urine (very rare) or other problems, you should call us  at [phone] or report to the nearest emergency room.  Do not stay seated continuously for more than 2-3 hours for a day or two after the procedure.   Tighten your buttock muscles 10-15 times every two hours and take 10-15 deep breaths every 1-2 hours.  Do not spend more than a few minutes on the toilet if you cannot empty your bowel; instead re-visit the toilet at a later time.    Follow up Visit is scheduled for 09/03/2024 @ 10:30 am.  Thank you for trusting me with your gastrointestinal care!    Y. Estefana Kidney, MD  _______________________________________________________  If your blood pressure at your visit was 140/90 or greater, please contact your primary care physician to follow up on this.  _______________________________________________________  If you are age 61 or older, your body mass index should be between 23-30. Your Body mass index is 24.2 kg/m. If this is out of the aforementioned range listed, please consider follow up with your Primary Care Provider.  If you are age 61 or younger, your body mass index should be between 19-25. Your Body mass index is 24.2 kg/m. If this is out of the aformentioned range listed, please consider follow up with your Primary Care Provider.   ________________________________________________________  The [facility] providers would like to encourage you to use MYCHART to communicate with providers for non-urgent requests or questions.  Due to long hold times on the telephone, sending your provider a message by [facility] may be a faster and more efficient way to get a response.  Please allow 48 business hours for a response.  Please remember  that this is for non-urgent requests.  _______________________________________________________  [facility] is using a team-based approach to care.  Your team is made up of your doctor and two to three APPS. Our APPS (Nurse Practitioners and Physician Assistants) work with your physician to ensure care continuity for you. They are fully qualified to address your health concerns and develop a treatment plan. They communicate directly with your  gastroenterologist to care for you. Seeing the Advanced Practice Practitioners on your physician's team can help you by facilitating care more promptly, often allowing for earlier appointments, access to diagnostic testing, procedures, and other specialty referrals.

## 2024-08-12 ENCOUNTER — Telehealth: Payer: BC Managed Care – PPO | Admitting: Adult Health

## 2024-08-12 ENCOUNTER — Telehealth: Payer: Self-pay | Admitting: Adult Health

## 2024-08-12 NOTE — Telephone Encounter (Signed)
 Pt out of state. [place] . Had to canc apt. Pt notified. Will RS. Pt stated she's doing good. May need RF.

## 2024-08-15 ENCOUNTER — Ambulatory Visit: Admitting: Dermatology

## 2024-08-15 ENCOUNTER — Other Ambulatory Visit: Payer: Self-pay | Admitting: Physician Assistant

## 2024-08-15 DIAGNOSIS — I10 Essential (primary) hypertension: Secondary | ICD-10-CM

## 2024-08-15 DIAGNOSIS — R053 Chronic cough: Secondary | ICD-10-CM

## 2024-08-15 DIAGNOSIS — K219 Gastro-esophageal reflux disease without esophagitis: Secondary | ICD-10-CM

## 2024-08-21 ENCOUNTER — Other Ambulatory Visit: Payer: Self-pay

## 2024-08-21 ENCOUNTER — Ambulatory Visit: Admitting: Internal Medicine

## 2024-08-21 ENCOUNTER — Encounter: Payer: Self-pay | Admitting: Internal Medicine

## 2024-08-21 VITALS — BP 137/91 | HR 71 | Temp 97.2°F | Resp 14 | Ht 65.0 in | Wt 141.0 lb

## 2024-08-21 DIAGNOSIS — K219 Gastro-esophageal reflux disease without esophagitis: Secondary | ICD-10-CM

## 2024-08-21 DIAGNOSIS — R1310 Dysphagia, unspecified: Secondary | ICD-10-CM | POA: Diagnosis not present

## 2024-08-21 DIAGNOSIS — G4700 Insomnia, unspecified: Secondary | ICD-10-CM

## 2024-08-21 MED ORDER — TRAZODONE HCL 100 MG PO TABS: ORAL_TABLET | ORAL | 0 refills | Status: DC

## 2024-08-21 MED ORDER — SODIUM CHLORIDE 0.9 % IV SOLN: 500.0000 mL | Freq: Once | INTRAVENOUS | Status: DC

## 2024-08-21 NOTE — Patient Instructions (Addendum)
-   follow up in office 10/29/24 at 9:20am  -await pathology results. -Continue present medications.  YOU HAD AN ENDOSCOPIC PROCEDURE TODAY AT THE [facility]:   Refer to the procedure report that was given to you for any specific questions about what was found during the examination.  If the procedure report does not answer your questions, please call your gastroenterologist to clarify.  If you requested that your care partner not be given the details of your procedure findings, then the procedure report has been included in a sealed envelope for you to review at your convenience later.  YOU SHOULD EXPECT: Some feelings of bloating in the abdomen. Passage of more gas than usual.  Walking can help get rid of the air that was put into your GI tract during the procedure and reduce the bloating. If you had a lower endoscopy (such as a colonoscopy or flexible sigmoidoscopy) you may notice spotting of blood in your stool or on the toilet paper. If you underwent a bowel prep for your procedure, you may not have a normal bowel movement for a few days.  Please Note:  You might notice some irritation and congestion in your nose or some drainage.  This is from the oxygen used during your procedure.  There is no need for concern and it should clear up in a day or so.  SYMPTOMS TO REPORT IMMEDIATELY:  Following upper endoscopy (EGD)  Vomiting of blood or coffee ground material  New chest pain or pain under the shoulder blades  Painful or persistently difficult swallowing  New shortness of breath  Fever of 100F or higher  Black, tarry-looking stools  For urgent or emergent issues, a gastroenterologist can be reached at any hour by calling [phone]. Do not use MyChart messaging for urgent concerns.    DIET:  We do recommend a small meal at first, but then you may proceed to your regular diet.  Drink plenty of fluids but you should avoid alcoholic beverages for 24 hours.  ACTIVITY:  You  should plan to take it easy for the rest of today and you should NOT DRIVE or use heavy machinery until tomorrow (because of the sedation medicines used during the test).    FOLLOW UP: Our staff will call the number listed on your records the next business day following your procedure.  We will call around 7:15- 8:00 am to check on you and address any questions or concerns that you may have regarding the information given to you following your procedure. If we do not reach you, we will leave a message.     If any biopsies were taken you will be contacted by phone or by letter within the next 1-3 weeks.  Please call us  at [phone] if you have not heard about the biopsies in 3 weeks.    SIGNATURES/CONFIDENTIALITY: You and/or your care partner have signed paperwork which will be entered into your electronic medical record.  These signatures attest to the fact that that the information above on your After Visit Summary has been reviewed and is understood.  Full responsibility of the confidentiality of this discharge information lies with you and/or your care-partner.

## 2024-08-21 NOTE — Progress Notes (Signed)
 Pt eye is painful, red and slightly swollen. States it dry and feels like there something stuck in it. Pt used husbands eye drops with out relief Do. [name] made aware and instructed patient to call her eye doctor for recommendations. Tammy Randolph

## 2024-08-21 NOTE — Op Note (Signed)
 [facility] Patient Name: Tammy Randolph Procedure Date: 08/21/2024 9:56 AM MRN: [id number] Endoscopist: Rosario Estefana Kidney , , [id number] Age: 63 Referring MD:  Date of Birth: Jun 29, 1961 Gender: Female Account #: [id number] Procedure:                Upper GI endoscopy Indications:              Dysphagia, Heartburn Medicines:                Monitored Anesthesia Care Procedure:                Pre-Anesthesia Assessment:                           - Prior to the procedure, a History and Physical                            was performed, and patient medications and                            allergies were reviewed. The patient's tolerance of                            previous anesthesia was also reviewed. The risks                            and benefits of the procedure and the sedation                            options and risks were discussed with the patient.                            All questions were answered, and informed consent                            was obtained. Prior Anticoagulants: The patient has                            taken no anticoagulant or antiplatelet agents. ASA                            Grade Assessment: II - A patient with mild systemic                            disease. After reviewing the risks and benefits,                            the patient was deemed in satisfactory condition to                            undergo the procedure.                           After obtaining informed consent, the endoscope was  passed under direct vision. Throughout the                            procedure, the patient's blood pressure, pulse, and                            oxygen saturations were monitored continuously. The                            GIF [id number] #[id number] was introduced through the                            mouth, and advanced to the second part of duodenum.                            The upper GI endoscopy was  accomplished without                            difficulty. The patient tolerated the procedure                            well. Scope In: Scope Out: Findings:                 The examined esophagus was normal. A guidewire was                            placed and the scope was withdrawn. Dilation was                            performed with a Savary dilator with mild                            resistance at 19 mm. Biopsies were taken with a                            cold forceps for histology.                           The entire examined stomach was normal.                           The examined duodenum was normal. Complications:            No immediate complications. Estimated Blood Loss:     Estimated blood loss was minimal. Impression:               - Normal esophagus. Dilated. Biopsied.                           - Normal stomach.                           - Normal examined duodenum. Recommendation:           - Discharge patient to home (with escort).                           -  Await pathology results.                           - Return to GI clinic in 2-3 months.                           - The findings and recommendations were discussed                            with the patient. Dr Estefana [name] Rosario Estefana [name],  08/21/2024 10:12:31 AM

## 2024-08-21 NOTE — Progress Notes (Signed)
 GASTROENTEROLOGY PROCEDURE H&P NOTE   Primary Care Physician: Cyndi Shaver, PA-C    Reason for Procedure:   GERD, dysphagia  Plan:    EGD  Patient is appropriate for endoscopic procedure(s) in the ambulatory ([facility]) setting.  The nature of the procedure, as well as the risks, benefits, and alternatives were carefully and thoroughly reviewed with the patient. Ample time for discussion and questions allowed. The patient understood, was satisfied, and agreed to proceed.     HPI: Tammy Randolph is a 63 y.o. female who presents for EGD for evaluation of GERD and dysphagia. Patient was recently told by ENT that she would need an EGD since she was noted to have signs of GERD (LPR) on fiberoptic upper airway evaluation. Patient does have a cough when she lays down and some dysphagia in her throat.   Past Medical History:  Diagnosis Date   Adenomatous colon polyp 1996   Anxiety    ANXIETY 07/15/2008   Qualifier: Diagnosis of  By: Joshua CMA, Chemira     Arthritis    Asthma    Bipolar disorder (HCC)    Chronic fatigue    Depression    Family history of breast cancer    Family history of colon cancer    Family history of pancreatic cancer    GERD (gastroesophageal reflux disease)    History of blood transfusion 2003   Hypertension    Personal history of adenomatous and serrated colon polyps    Pneumonia    couple of times last being last year   PONV (postoperative nausea and vomiting)    Seasonal allergies    Spinal headache     Past Surgical History:  Procedure Laterality Date   ABDOMINAL HYSTERECTOMY  2004   and USO for endometriosis   ANTERIOR CERVICAL DECOMP/DISCECTOMY FUSION N/A 09/23/2015   Procedure: ANTERIOR CERVICAL DISCECTOMY FUSION C4-7     (3 LEVELS);  Surgeon: Donaciano Sprang, MD;  Location: [facility];  Service: Orthopedics;  Laterality: N/A;   APPENDECTOMY  1988   BREAST ENHANCEMENT SURGERY Bilateral 2008   COLONOSCOPY W/ POLYPECTOMY  2013   Dr Aneita    HYSTERECTOMY ABDOMINAL WITH SALPINGECTOMY     x4 for endometriosis   KNEE ARTHROSCOPY WITH ANTERIOR CRUCIATE LIGAMENT (ACL) REPAIR Right 01/26/2013   LUMBAR LAMINECTOMY  2008   TONSILLECTOMY  2009    Prior to Admission medications   Medication Sig Start Date End Date Taking? Authorizing Provider  albuterol  (PROVENTIL  HFA;VENTOLIN  HFA) 108 (90 BASE) MCG/ACT inhaler Inhale 1-2 puffs into the lungs every 6 (six) hours as needed for wheezing or shortness of breath.   Yes [provider]  amphetamine -dextroamphetamine  (ADDERALL XR) 30 MG 24 hr capsule Take 1 capsule (30 mg total) by mouth daily. 06/26/24  Yes Mozingo, Regina Nattalie, NP  amphetamine -dextroamphetamine  (ADDERALL XR) 30 MG 24 hr capsule Take 1 capsule (30 mg total) by mouth daily. 07/24/24  Yes Mozingo, Regina Nattalie, NP  amphetamine -dextroamphetamine  (ADDERALL) 10 MG tablet Take 1 tablet (10 mg total) by mouth daily. 06/19/24  Yes Mozingo, Regina Nattalie, NP  Biotin 1 MG CAPS Take by mouth.   Yes [provider]  buPROPion  (WELLBUTRIN  XL) 150 MG 24 hr tablet Take 3 tablets (450 mg total) by mouth every morning. 02/06/24  Yes Mozingo, Regina Nattalie, NP  carvedilol  (COREG ) 12.5 MG tablet Take 1 tablet (12.5 mg total) by mouth 2 (two) times daily with a meal. 06/05/24  Yes Drubel, Shaver, PA-C  co-enzyme Q-10 30 MG  capsule Take 30 mg by mouth 3 (three) times daily.   Yes [provider]  estradiol  (VIVELLE -DOT) 0.05 MG/24HR patch 1 patch 2 (two) times a week. 07/11/21  Yes [provider]  lamoTRIgine  (LAMICTAL ) 150 MG tablet Take 1 tablet (150 mg total) by mouth daily. 08/08/24  Yes Mozingo, Regina Nattalie, NP  magnesium (MAGTAB) 84 MG ( ) TBCR SR tablet Take 84 mg by mouth.   Yes [provider]  Multiple Vitamin (MULTIVITAMIN) LIQD Take 5 mLs by mouth daily.   Yes [provider]  MYRBETRIQ 50 MG TB24 tablet Take 50 mg by mouth daily. 08/24/21  Yes [provider]   pantoprazole  (PROTONIX ) 40 MG tablet TAKE 1 TABLET(40 MG) BY MOUTH DAILY BEFORE BREAKFAST 08/16/24  Yes Drubel, Manuelita, PA-C  spironolactone  (ALDACTONE ) 25 MG tablet TAKE 1 TABLET(25 MG) BY MOUTH DAILY 08/16/24  Yes Drubel, Manuelita, PA-C  ALPRAZolam  (XANAX ) 1 MG tablet TAKE 1 TABLET BY MOUTH FOUR TIMES DAILY 02/06/24   Mozingo, Regina Nattalie, NP  amphetamine -dextroamphetamine  (ADDERALL) 10 MG tablet Take 1 tablet (10 mg total) by mouth daily. 07/17/24   Mozingo, Regina Nattalie, NP  Misc. Devices MISC Apply 1 gram to the vestibule daily (Estradiol  0.01% with Testosterone  0.1%) 04/20/21   [provider]  ondansetron  (ZOFRAN ) 4 MG tablet Take 1 tablet (4 mg total) by mouth every 8 (eight) hours as needed for nausea or vomiting. 09/23/15   Burnetta Aures, MD  OVER THE COUNTER MEDICATION     [provider]  pseudoephedrine-acetaminophen  (TYLENOL  SINUS) 30-500 MG TABS Take 1 tablet by mouth every 4 (four) hours as needed (Seasonal Allergies).    [provider]  topiramate  (TOPAMAX ) 25 MG tablet Take one tablet at bedtime for 7 nights, then take two tablets at bedtime. Patient not taking: No sig reported 02/06/24   Mozingo, Regina Nattalie, NP  traZODone  (DESYREL ) 100 MG tablet TAKE 1 TABLET(100 MG) BY MOUTH AT BEDTIME 08/21/24   Mozingo, Regina Nattalie, NP  Ubrogepant  (UBRELVY ) 50 MG TABS Take 1 tablet (50 mg total) by mouth as needed (headache). 02/20/24   Cyndi Manuelita, PA-C  zolpidem  (AMBIEN ) 10 MG tablet Take one tablet by mouth every night at bedtime 02/06/24   Mozingo, Regina Nattalie, NP    Current Outpatient Medications  Medication Sig Dispense Refill   albuterol  (PROVENTIL  HFA;VENTOLIN  HFA) 108 (90 BASE) MCG/ACT inhaler Inhale 1-2 puffs into the lungs every 6 (six) hours as needed for wheezing or shortness of breath.     amphetamine -dextroamphetamine  (ADDERALL XR) 30 MG 24 hr capsule Take 1 capsule (30 mg total) by mouth daily. 30 capsule 0    amphetamine -dextroamphetamine  (ADDERALL XR) 30 MG 24 hr capsule Take 1 capsule (30 mg total) by mouth daily. 30 capsule 0   amphetamine -dextroamphetamine  (ADDERALL) 10 MG tablet Take 1 tablet (10 mg total) by mouth daily. 30 tablet 0   Biotin 1 MG CAPS Take by mouth.     buPROPion  (WELLBUTRIN  XL) 150 MG 24 hr tablet Take 3 tablets (450 mg total) by mouth every morning. 90 tablet 5   carvedilol  (COREG ) 12.5 MG tablet Take 1 tablet (12.5 mg total) by mouth 2 (two) times daily with a meal. 180 tablet 0   co-enzyme Q-10 30 MG capsule Take 30 mg by mouth 3 (three) times daily.     estradiol  (VIVELLE -DOT) 0.05 MG/24HR patch 1 patch 2 (two) times a week.     lamoTRIgine  (LAMICTAL ) 150 MG tablet Take 1 tablet (150 mg total) by mouth daily.  30 tablet 0   magnesium (MAGTAB) 84 MG ( ) TBCR SR tablet Take 84 mg by mouth.     Multiple Vitamin (MULTIVITAMIN) LIQD Take 5 mLs by mouth daily.     MYRBETRIQ 50 MG TB24 tablet Take 50 mg by mouth daily.     pantoprazole  (PROTONIX ) 40 MG tablet TAKE 1 TABLET(40 MG) BY MOUTH DAILY BEFORE BREAKFAST 90 tablet 0   spironolactone  (ALDACTONE ) 25 MG tablet TAKE 1 TABLET(25 MG) BY MOUTH DAILY 90 tablet 0   ALPRAZolam  (XANAX ) 1 MG tablet TAKE 1 TABLET BY MOUTH FOUR TIMES DAILY 120 tablet 1   amphetamine -dextroamphetamine  (ADDERALL) 10 MG tablet Take 1 tablet (10 mg total) by mouth daily. 30 tablet 0   Misc. Devices MISC Apply 1 gram to the vestibule daily (Estradiol  0.01% with Testosterone  0.1%)     ondansetron  (ZOFRAN ) 4 MG tablet Take 1 tablet (4 mg total) by mouth every 8 (eight) hours as needed for nausea or vomiting. 20 tablet 0   OVER THE COUNTER MEDICATION      pseudoephedrine-acetaminophen  (TYLENOL  SINUS) 30-500 MG TABS Take 1 tablet by mouth every 4 (four) hours as needed (Seasonal Allergies).     topiramate  (TOPAMAX ) 25 MG tablet Take one tablet at bedtime for 7 nights, then take two tablets at bedtime. (Patient not taking: No sig reported) 60 tablet 2    traZODone  (DESYREL ) 100 MG tablet TAKE 1 TABLET(100 MG) BY MOUTH AT BEDTIME 30 tablet 0   Ubrogepant  (UBRELVY ) 50 MG TABS Take 1 tablet (50 mg total) by mouth as needed (headache). 9 tablet 1   zolpidem  (AMBIEN ) 10 MG tablet Take one tablet by mouth every night at bedtime 30 tablet 2   Current Facility-Administered Medications  Medication Dose Route Frequency Provider Last Rate Last Admin   0.9 %  sodium chloride  infusion  500 mL Intravenous Once [name] Rosario BROCKS, MD        Allergies as of 08/21/2024 - Review Complete 08/21/2024  Allergen Reaction Noted   Hydrocodone-acetaminophen  Itching and Rash 09/22/2009   Morphine Other (See Comments) 09/22/2009    Family History  Problem Relation Age of Onset   CVA Mother        x3   Hypertension Mother    Arthritis Mother    Breast cancer Mother        dx. 50s   Heart Problems Mother        pacemaker   Colon polyps Mother    Sleep apnea Father    CVA Father    Sleep apnea Sister    Hypertension Sister    Hypertension Sister    Colon polyps Sister        needed surgery to remove a large one   Colon polyps Brother    Clotting disorder Brother    Colon polyps Brother    Leukemia Maternal Uncle 63       CLL   Heart attack Maternal Uncle    Leukemia Maternal Uncle        CLL   Coronary artery disease Paternal Aunt    Stroke Paternal Uncle    Pancreatic cancer Paternal Uncle    Colon cancer Maternal Grandmother 20   Breast cancer Paternal Grandmother        dx. 10s   Lung cancer Paternal Grandfather     Social History   Socioeconomic History   Marital status: Married    Spouse name: Not on file   Number of children: Not on file  Years of education: Not on file   Highest education level: Not on file  Occupational History   Not on file  Tobacco Use   Smoking status: Former    Current packs/day: 0.00    Types: Cigarettes    Quit date: 12/26/1986    Years since quitting: 37.6   Smokeless tobacco: Never   Tobacco comments:     up to 1& 1/2packs /week  Vaping Use   Vaping status: Never Used  Substance and Sexual Activity   Alcohol use: Yes    Alcohol/week: 0.0 standard drinks of alcohol    Comment: socially   Drug use: No   Sexual activity: Not on file  Other Topics Concern   Not on file  Social History Narrative   Not on file   Social Drivers of Health   Financial Resource Strain: Low Risk  (10/31/2021)   Received [facility]   Overall Financial Resource Strain (CARDIA)    Difficulty of Paying Living Expenses: Not hard at all  Food Insecurity: No Food Insecurity (10/31/2021)   Received from [facility]   Hunger Vital Sign    Within the past 12 months, you worried that your food would run out before you got the money to buy more.: Never true    Within the past 12 months, the food you bought just didn't last and you didn't have money to get more.: Never true  Transportation Needs: No Transportation Needs (10/31/2021)   Received from [facility] - Transportation    Lack of Transportation (Medical): No    Lack of Transportation (Non-Medical): No  Physical Activity: Insufficiently Active (10/31/2021)   Received from [facility]   Exercise Vital Sign    On average, how many days per week do you engage in moderate to strenuous exercise (like a brisk walk)?: 3 days    On average, how many minutes do you engage in exercise at this level?: 40 min  Stress: Stress Concern Present (10/31/2021)   Received from [facility] - Occupational Stress Questionnaire    Feeling of Stress : To some extent  Social Connections: Unknown (05/06/2022)   Received from [facility]   Social Network    Social Network: Not on file  Intimate Partner Violence: Unknown (03/29/2022)   Received from [facility]   HITS    Physically Hurt: Not on file    Insult or Talk Down To: Not on file    Threaten Physical Harm: Not on file    Scream or Curse: Not on file     Physical Exam: Vital signs in last 24 hours: BP 136/84   Pulse 68   Temp (!) 97.2 F (36.2 C)   Ht 5' 5 (1.651 m)   Wt 141 lb (64 kg)   SpO2 98%   BMI 23.46 kg/m  GEN: NAD EYE: Sclerae anicteric ENT: MMM CV: Non-tachycardic Pulm: No increased work of breathing GI: Soft, NT/ND NEURO:  Alert & Oriented   Estefana Kidney, MD [facility]  08/21/2024 9:49 AM

## 2024-08-21 NOTE — Progress Notes (Signed)
 Report to PACU, RN, vss, BBS= Clear.

## 2024-08-21 NOTE — Progress Notes (Signed)
 Pt's states no medical or surgical changes since previsit or office visit.

## 2024-08-22 ENCOUNTER — Telehealth: Payer: Self-pay

## 2024-08-22 NOTE — Telephone Encounter (Signed)
 Attempted to reach patient for follow up phone call. No answer, left voicemail to contact Dr. Lafonda office with any questions or concerns.

## 2024-08-23 LAB — SURGICAL PATHOLOGY

## 2024-08-27 ENCOUNTER — Ambulatory Visit: Payer: Self-pay | Admitting: Internal Medicine

## 2024-08-27 ENCOUNTER — Encounter: Payer: Self-pay | Admitting: Dermatology

## 2024-09-02 ENCOUNTER — Ambulatory Visit: Admitting: Dermatology

## 2024-09-02 ENCOUNTER — Telehealth: Payer: Self-pay

## 2024-09-02 NOTE — Telephone Encounter (Signed)
 Patient called and left a voicemail letting us  know that she had attempted to cancel today's surgery several times, and that she will not be returning to our office for treatment. Dr. Corey has been made aware and a certified letter has been sent to the patient regarding the untreated skin cancer.

## 2024-09-02 NOTE — Progress Notes (Deleted)
   Follow-Up Visit   Subjective  Tammy Randolph is a 63 y.o. female who presents for the following: Excision of mid back  The following portions of the chart were reviewed this encounter and updated as appropriate: medications, allergies, medical history  Review of Systems:  No other skin or systemic complaints except as noted in HPI or Assessment and Plan.  Objective  Well appearing patient in no apparent distress; mood and affect are within normal limits.  A focused examination was performed of the following areas: Mid back Relevant physical exam findings are noted in the Assessment and Plan.     Assessment & Plan      No follow-ups on file.  ***  Documentation: I have reviewed the above documentation for accuracy and completeness, and I agree with the above.  RUFUS CHRISTELLA HOLY, MD

## 2024-09-02 NOTE — Patient Instructions (Addendum)
 Tammy Randolph

## 2024-09-03 ENCOUNTER — Encounter: Admitting: Internal Medicine

## 2024-09-10 ENCOUNTER — Telehealth: Payer: Self-pay | Admitting: Adult Health

## 2024-09-10 NOTE — Telephone Encounter (Addendum)
 Pt has RF for both doses at the requested pharmacy.  Notified patient. She said they told her they didn't have RF. Provided pt with date Rx sent and start date.

## 2024-09-10 NOTE — Telephone Encounter (Signed)
 Pt called requesting Rx for adderall 10 mg and 30 mg to [address]

## 2024-09-12 ENCOUNTER — Telehealth: Payer: Self-pay | Admitting: Internal Medicine

## 2024-09-12 NOTE — Telephone Encounter (Signed)
Inbound call from patient wanting to schedule for Hemorid banding.   Please advise

## 2024-09-13 ENCOUNTER — Other Ambulatory Visit: Payer: Self-pay | Admitting: Adult Health

## 2024-09-13 DIAGNOSIS — G4700 Insomnia, unspecified: Secondary | ICD-10-CM

## 2024-09-13 NOTE — Telephone Encounter (Signed)
 Patient is actually unable to do 9/30 d/t being out of town. Rescheduled for 10/8 at 2:50 pm with Dr. Avram.

## 2024-09-13 NOTE — Telephone Encounter (Signed)
 Patient was scheduled for her 3rd hemorrhoid banding last week, but had to cancel d/t her granddaughter's surgery. She would like to reschedule. Dr. [name] is currently out of the office until the new year. Only one other MD in our POD does hemorrhoid bandings & his schedule is out until December. Will review schedule & see if another MD would be willing to do the banding.

## 2024-09-16 ENCOUNTER — Telehealth (INDEPENDENT_AMBULATORY_CARE_PROVIDER_SITE_OTHER): Admitting: Adult Health

## 2024-09-16 ENCOUNTER — Encounter: Payer: Self-pay | Admitting: Adult Health

## 2024-09-16 DIAGNOSIS — G4700 Insomnia, unspecified: Secondary | ICD-10-CM

## 2024-09-16 DIAGNOSIS — F411 Generalized anxiety disorder: Secondary | ICD-10-CM | POA: Diagnosis not present

## 2024-09-16 DIAGNOSIS — F3181 Bipolar II disorder: Secondary | ICD-10-CM

## 2024-09-16 DIAGNOSIS — F909 Attention-deficit hyperactivity disorder, unspecified type: Secondary | ICD-10-CM | POA: Diagnosis not present

## 2024-09-16 DIAGNOSIS — F900 Attention-deficit hyperactivity disorder, predominantly inattentive type: Secondary | ICD-10-CM

## 2024-09-16 MED ORDER — AMPHETAMINE-DEXTROAMPHET ER 30 MG PO CP24: 30.0000 mg | ORAL_CAPSULE | Freq: Every day | ORAL | 0 refills | Status: DC

## 2024-09-16 MED ORDER — LAMOTRIGINE 150 MG PO TABS: 150.0000 mg | ORAL_TABLET | Freq: Every day | ORAL | 5 refills | Status: AC

## 2024-09-16 MED ORDER — ALPRAZOLAM 1 MG PO TABS: ORAL_TABLET | ORAL | 2 refills | Status: AC

## 2024-09-16 MED ORDER — AMPHETAMINE-DEXTROAMPHETAMINE 10 MG PO TABS: 10.0000 mg | ORAL_TABLET | Freq: Every day | ORAL | 0 refills | Status: DC

## 2024-09-16 MED ORDER — TRAZODONE HCL 100 MG PO TABS
ORAL_TABLET | ORAL | 5 refills | Status: AC
Start: 2024-09-16 — End: ?

## 2024-09-16 MED ORDER — AMPHETAMINE-DEXTROAMPHETAMINE 10 MG PO TABS: ORAL_TABLET | ORAL | 0 refills | Status: AC

## 2024-09-16 MED ORDER — BUPROPION HCL ER (XL) 150 MG PO TB24: 450.0000 mg | ORAL_TABLET | Freq: Every morning | ORAL | 5 refills | Status: AC

## 2024-09-16 MED ORDER — AMPHETAMINE-DEXTROAMPHET ER 30 MG PO CP24: 30.0000 mg | ORAL_CAPSULE | Freq: Every day | ORAL | 0 refills | Status: AC

## 2024-09-16 NOTE — Progress Notes (Signed)
 Tammy Randolph [id number] May 25, 1961 63 y.o.  Virtual Visit via Video Note  I connected with pt @ on 09/16/24 at  3:00 PM EDT by a video enabled telemedicine application and verified that I am speaking with the correct person using two identifiers.   I discussed the limitations of evaluation and management by telemedicine and the availability of in person appointments. The patient expressed understanding and agreed to proceed.  I discussed the assessment and treatment plan with the patient. The patient was provided an opportunity to ask questions and all were answered. The patient agreed with the plan and demonstrated an understanding of the instructions.   The patient was advised to call back or seek an in-person evaluation if the symptoms worsen or if the condition fails to improve as anticipated.  I provided 25 minutes of non-face-to-face time during this encounter.  The patient was located at home.  The provider was located at [facility].   Tammy LOISE Sayers, NP   Subjective:   Patient ID:  Tammy Randolph is a 63 y.o. (DOB [birth year]) female.  Chief Complaint: No chief complaint on file.   HPI Tammy Randolph presents for follow-up of GAD, insomnia, BPD-2, and ADHD.  Describes mood today as ok. Pleasant. Reports tearfulness. Mood symptoms - reports situational stressors - anxiety and depression. Reports stable interest and motivation. Denies irritability. Denies panic attacks. Reports some worry, rumination, and over thinking - family members. Reports mood is variable. Stating I feel like I'm doing alright - some days are tougher than others. Feels like medications are helpful. Taking medications as prescribed.  Energy levels stable. Active, has a regular exercise routine - walking.  Enjoys some usual interests and activities. Married. Lives with husband. Children local. Spending time with family and friends. Appetite adequate. Weight loss - 142 from 155  pounds. Sleeping better some nights than others - lots on her mind - waking up throughout the night.  Focus and concentration stable - reporting multiple stressors. Completing tasks. Managing aspects of household. Business owner. Denies SI or HI.  Denies AH or VH. Denies self harm. Denies substance use.   Previous medication trials: Celexa, Lexapro, Effexor in addition to Viibryd, Wellbutrin , Lamictal  last year, Cymbalta  30 mg   Review of Systems:  Review of Systems  Musculoskeletal:  Negative for gait problem.  Neurological:  Negative for tremors.  Psychiatric/Behavioral:         Please refer to HPI    Medications: I have reviewed the patient's current medications.  Current Outpatient Medications  Medication Sig Dispense Refill   albuterol  (PROVENTIL  HFA;VENTOLIN  HFA) 108 (90 BASE) MCG/ACT inhaler Inhale 1-2 puffs into the lungs every 6 (six) hours as needed for wheezing or shortness of breath.     ALPRAZolam  (XANAX ) 1 MG tablet TAKE 1 TABLET BY MOUTH FOUR TIMES DAILY 120 tablet 1   amphetamine -dextroamphetamine  (ADDERALL XR) 30 MG 24 hr capsule Take 1 capsule (30 mg total) by mouth daily. 30 capsule 0   amphetamine -dextroamphetamine  (ADDERALL XR) 30 MG 24 hr capsule Take 1 capsule (30 mg total) by mouth daily. 30 capsule 0   amphetamine -dextroamphetamine  (ADDERALL) 10 MG tablet Take 1 tablet (10 mg total) by mouth daily. 30 tablet 0   amphetamine -dextroamphetamine  (ADDERALL) 10 MG tablet Take 1 tablet (10 mg total) by mouth daily. 30 tablet 0   Biotin 1 MG CAPS Take by mouth.     buPROPion  (WELLBUTRIN  XL) 150 MG 24 hr tablet Take 3 tablets (450 mg total) by mouth  every morning. 90 tablet 5   carvedilol  (COREG ) 12.5 MG tablet Take 1 tablet (12.5 mg total) by mouth 2 (two) times daily with a meal. 180 tablet 0   co-enzyme Q-10 30 MG capsule Take 30 mg by mouth 3 (three) times daily.     estradiol  (VIVELLE -DOT) 0.05 MG/24HR patch 1 patch 2 (two) times a week.     lamoTRIgine   (LAMICTAL ) 150 MG tablet Take 1 tablet (150 mg total) by mouth daily. 30 tablet 0   magnesium (MAGTAB) 84 MG ( ) TBCR SR tablet Take 84 mg by mouth.     Misc. Devices MISC Apply 1 gram to the vestibule daily (Estradiol  0.01% with Testosterone  0.1%)     Multiple Vitamin (MULTIVITAMIN) LIQD Take 5 mLs by mouth daily.     MYRBETRIQ 50 MG TB24 tablet Take 50 mg by mouth daily.     ondansetron  (ZOFRAN ) 4 MG tablet Take 1 tablet (4 mg total) by mouth every 8 (eight) hours as needed for nausea or vomiting. 20 tablet 0   OVER THE COUNTER MEDICATION      pantoprazole  (PROTONIX ) 40 MG tablet TAKE 1 TABLET(40 MG) BY MOUTH DAILY BEFORE BREAKFAST 90 tablet 0   pseudoephedrine-acetaminophen  (TYLENOL  SINUS) 30-500 MG TABS Take 1 tablet by mouth every 4 (four) hours as needed (Seasonal Allergies).     spironolactone  (ALDACTONE ) 25 MG tablet TAKE 1 TABLET(25 MG) BY MOUTH DAILY 90 tablet 0   topiramate  (TOPAMAX ) 25 MG tablet Take one tablet at bedtime for 7 nights, then take two tablets at bedtime. (Patient not taking: No sig reported) 60 tablet 2   traZODone  (DESYREL ) 100 MG tablet TAKE 1 TABLET(100 MG) BY MOUTH AT BEDTIME 30 tablet 0   Ubrogepant  (UBRELVY ) 50 MG TABS Take 1 tablet (50 mg total) by mouth as needed (headache). 9 tablet 1   zolpidem  (AMBIEN ) 10 MG tablet Take one tablet by mouth every night at bedtime 30 tablet 2   No current facility-administered medications for this visit.    Medication Side Effects: None  Allergies:  Allergies  Allergen Reactions   Hydrocodone-Acetaminophen  Itching and Rash   Morphine Other (See Comments)    Pt did not like the way it made her feel    Past Medical History:  Diagnosis Date   Adenomatous colon polyp 1996   Anxiety    ANXIETY 07/15/2008   Qualifier: Diagnosis of  By: Joshua CMA, Chemira     Arthritis    Asthma    Bipolar disorder (HCC)    Chronic fatigue    Depression    Family history of breast cancer    Family history of colon cancer     Family history of pancreatic cancer    GERD (gastroesophageal reflux disease)    History of blood transfusion 2003   Hypertension    Personal history of adenomatous and serrated colon polyps    Pneumonia    couple of times last being last year   PONV (postoperative nausea and vomiting)    Seasonal allergies    Spinal headache     Family History  Problem Relation Age of Onset   CVA Mother        x3   Hypertension Mother    Arthritis Mother    Breast cancer Mother        dx. 50s   Heart Problems Mother        pacemaker   Colon polyps Mother    Sleep apnea Father    CVA Father  Sleep apnea Sister    Hypertension Sister    Hypertension Sister    Colon polyps Sister        needed surgery to remove a large one   Colon polyps Brother    Clotting disorder Brother    Colon polyps Brother    Leukemia Maternal Uncle 66       CLL   Heart attack Maternal Uncle    Leukemia Maternal Uncle        CLL   Coronary artery disease Paternal Aunt    Stroke Paternal Uncle    Pancreatic cancer Paternal Uncle    Colon cancer Maternal Grandmother 30   Breast cancer Paternal Grandmother        dx. 40s   Lung cancer Paternal Grandfather     Social History   Socioeconomic History   Marital status: Married    Spouse name: Not on file   Number of children: Not on file   Years of education: Not on file   Highest education level: Not on file  Occupational History   Not on file  Tobacco Use   Smoking status: Former    Current packs/day: 0.00    Types: Cigarettes    Quit date: 12/26/1986    Years since quitting: 37.7   Smokeless tobacco: Never   Tobacco comments:    up to 1& 1/2packs /week  Vaping Use   Vaping status: Never Used  Substance and Sexual Activity   Alcohol use: Yes    Alcohol/week: 0.0 standard drinks of alcohol    Comment: socially   Drug use: No   Sexual activity: Not on file  Other Topics Concern   Not on file  Social History Narrative   Not on file   Social  Drivers of Health   Financial Resource Strain: Low Risk  (10/31/2021)   Received from [facility]   Overall Financial Resource Strain (CARDIA)    Difficulty of Paying Living Expenses: Not hard at all  Food Insecurity: No Food Insecurity (10/31/2021)   Received from [facility]   Hunger Vital Sign    Within the past 12 months, you worried that your food would run out before you got the money to buy more.: Never true    Within the past 12 months, the food you bought just didn't last and you didn't have money to get more.: Never true  Transportation Needs: No Transportation Needs (10/31/2021)   Received from [facility] - Transportation    Lack of Transportation (Medical): No    Lack of Transportation (Non-Medical): No  Physical Activity: Insufficiently Active (10/31/2021)   Received from [facility]   Exercise Vital Sign    On average, how many days per week do you engage in moderate to strenuous exercise (like a brisk walk)?: 3 days    On average, how many minutes do you engage in exercise at this level?: 40 min  Stress: Stress Concern Present (10/31/2021)   Received from [facility] - Occupational Stress Questionnaire    Feeling of Stress : To some extent  Social Connections: Unknown (05/06/2022)   Received from [facility]   Social Network    Social Network: Not on file  Intimate Partner Violence: Unknown (03/29/2022)   Received from [facility]   HITS    Physically Hurt: Not on file    Insult or Talk Down To: Not on file    Threaten Physical Harm: Not  on file    Scream or Curse: Not on file    Past Medical History, Surgical history, Social history, and Family history were reviewed and updated as appropriate.   Please see review of systems for further details on the patient's review from today.   Objective:   Physical Exam:  There were no vitals taken for this visit.  Physical Exam Constitutional:       General: She is not in acute distress. Musculoskeletal:        General: No deformity.  Neurological:     Mental Status: She is alert and oriented to person, place, and time.     Coordination: Coordination normal.  Psychiatric:        Attention and Perception: Attention and perception normal. She does not perceive auditory or visual hallucinations.        Mood and Affect: Mood normal. Mood is not anxious or depressed. Affect is not labile, blunt, angry or inappropriate.        Speech: Speech normal.        Behavior: Behavior normal.        Thought Content: Thought content normal. Thought content is not paranoid or delusional. Thought content does not include homicidal or suicidal ideation. Thought content does not include homicidal or suicidal plan.        Cognition and Memory: Cognition and memory normal.        Judgment: Judgment normal.     Comments: Insight intact     Lab Review:     Component Value Date/Time   NA 138 02/20/2024 1019   NA 138 01/17/2018 0846   K 4.3 02/20/2024 1019   CL 106 02/20/2024 1019   CO2 26 02/20/2024 1019   GLUCOSE 80 02/20/2024 1019   BUN 24 (H) 02/20/2024 1019   BUN 11 01/17/2018 0846   CREATININE 1.03 02/20/2024 1019   CALCIUM 8.8 02/20/2024 1019   PROT 6.7 02/20/2024 1019   ALBUMIN 4.3 02/20/2024 1019   AST 18 02/20/2024 1019   ALT 10 02/20/2024 1019   ALKPHOS 72 02/20/2024 1019   BILITOT 0.6 02/20/2024 1019   GFRNONAA >60 09/06/2020 1858   GFRAA >60 09/06/2020 1858       Component Value Date/Time   WBC 5.6 02/20/2024 1019   RBC 4.18 02/20/2024 1019   HGB 13.1 02/20/2024 1019   HGB 13.7 02/02/2011 1448   HCT 39.1 02/20/2024 1019   HCT 39.3 02/02/2011 1448   PLT 248.0 02/20/2024 1019   PLT 264 02/02/2011 1448   MCV 93.4 02/20/2024 1019   MCV 89.3 02/02/2011 1448   MCH 30.1 09/06/2020 1858   MCHC 33.6 02/20/2024 1019   RDW 12.6 02/20/2024 1019   RDW 12.3 02/02/2011 1448   LYMPHSABS 1.4 02/20/2024 1019   LYMPHSABS 2.0 02/02/2011  1448   MONOABS 0.7 02/20/2024 1019   MONOABS 0.5 02/02/2011 1448   EOSABS 0.2 02/20/2024 1019   EOSABS 0.2 02/02/2011 1448   BASOSABS 0.1 02/20/2024 1019   BASOSABS 0.0 02/02/2011 1448    No results found for: POCLITH, LITHIUM   No results found for: PHENYTOIN, PHENOBARB, VALPROATE, CBMZ   .res Assessment: Plan:    Plan:  PDMP reviewed  Wellbutrin  150 mg XL taking 3 tablets total 450 mg every morning  Adderall 30 mg - XR daily in the morning - not taking every day Adderall 10mg  - 1/2 to 1 tablet daily as needed - not taking every day  Trazadone daily for insomnia Ambien  10mg  at hs -  taking some nights  Xanax  1mg  - 4 x daily - taking some days  Lamictal  150mg  daily at hs for mood stabilization.  D/C Topamax  50mg  at hs for mood stabilization.  RTC 3 months  25 minutes spent dedicated to the care of this patient on the date of this encounter to include pre-visit review of records, ordering of medication, post visit documentation, and face-to-face time with the patient discussing GAD, insomnia, BPD-2, and ADHD. Discussed continuing current medication regimen.  Patient advised to contact office with any questions, adverse effects, or acute worsening in signs and symptoms.  Discussed potential benefits, risk, and side effects of benzodiazepines to include potential risk of tolerance and dependence, as well as possible drowsiness.  Advised patient not to drive if experiencing drowsiness and to take lowest possible effective dose to minimize risk of dependence and tolerance.  Discussed potential benefits, risks, and side effects of stimulants with patient to include increased heart rate, palpitations, insomnia, increased anxiety, increased irritability, or decreased appetite.  Instructed patient to contact office if experiencing any significant tolerability issues.  Counseled patient regarding potential benefits, risks, and side effects of Lamictal  to include potential  risk of Stevens-Johnson syndrome. Advised patient to stop taking Lamictal  and contact office immediately if rash develops and to seek urgent medical attention if rash is severe and/or spreading quickly.   There are no diagnoses linked to this encounter.   Please see After Visit Summary for patient specific instructions.  Future Appointments  Date Time Provider Department Center  09/16/2024  3:00 PM Andros Channing Nattalie, NP [facility] None  10/02/2024  2:50 PM Avram Lupita BRAVO, MD [facility]  10/29/2024  9:20 AM McMichael, Nestor HERO, PA-C [facility]    No orders of the defined types were placed in this encounter.     -------------------------------

## 2024-09-24 ENCOUNTER — Encounter: Admitting: Internal Medicine

## 2024-10-02 ENCOUNTER — Ambulatory Visit (INDEPENDENT_AMBULATORY_CARE_PROVIDER_SITE_OTHER): Admitting: Internal Medicine

## 2024-10-02 ENCOUNTER — Encounter: Payer: Self-pay | Admitting: Internal Medicine

## 2024-10-02 VITALS — BP 160/90 | HR 83 | Ht 64.0 in | Wt 138.5 lb

## 2024-10-02 DIAGNOSIS — K648 Other hemorrhoids: Secondary | ICD-10-CM

## 2024-10-02 DIAGNOSIS — K641 Second degree hemorrhoids: Secondary | ICD-10-CM

## 2024-10-02 NOTE — Progress Notes (Signed)
   HEMORRHOID LIGATION  Patient is here for third hemorrhoidal ligation.  Patient to Dr. Lafonda.  2 prior banding's without incident.  Blondie Barks, CMA present  Rectal exam no mass nontender  Anoscopy shows grade 2 right posterior internal hemorrhoid grade 1 right anterior and left lateral.  PROCEDURE NOTE: The patient presents with symptomatic grade 2  hemorrhoids, requesting rubber band ligation of his/her hemorrhoidal disease.  All risks, benefits and alternative forms of therapy were described and informed consent was obtained.   The anorectum was pre-medicated with 0.125% nitroglycerin and 5% lidocaine  The decision was made to band the right posterior internal hemorrhoid, and the [facility]'Regan System was used to perform band ligation without complication.  Digital anorectal examination was then performed to assure proper positioning of the band, and to adjust the banded tissue as required.  The patient was discharged home without pain or other issues.  Dietary and behavioral recommendations were given and along with follow-up instructions.      The patient will return as needed for  follow-up and possible additional banding as required. No complications were encountered and the patient tolerated the procedure well.

## 2024-10-02 NOTE — Patient Instructions (Signed)
 HEMORRHOID BANDING PROCEDURE    FOLLOW-UP CARE   The procedure you have had should have been relatively painless since the banding of the area involved does not have nerve endings and there is no pain sensation.  The rubber band cuts off the blood supply to the hemorrhoid and the band may fall off as soon as 48 hours after the banding (the band may occasionally be seen in the toilet bowl following a bowel movement). You may notice a temporary feeling of fullness in the rectum which should respond adequately to plain Tylenol  or Motrin.  Following the banding, avoid strenuous exercise that evening and resume full activity the next day.  A sitz bath (soaking in a warm tub) or bidet is soothing, and can be useful for cleansing the area after bowel movements.     To avoid constipation, take two tablespoons of natural wheat bran, natural oat bran, flax, Benefiber or any over the counter fiber supplement and increase your water intake to 7-8 glasses daily.    Unless you have been prescribed anorectal medication, do not put anything inside your rectum for two weeks: No suppositories, enemas, fingers, etc.  Occasionally, you may have more bleeding than usual after the banding procedure.  This is often from the untreated hemorrhoids rather than the treated one.  Don't be concerned if there is a tablespoon or so of blood.  If there is more blood than this, lie flat with your bottom higher than your head and apply an ice pack to the area. If the bleeding does not stop within a half an hour or if you feel faint, call our office at [phone] or go to the emergency room.  Problems are not common; however, if there is a substantial amount of bleeding, severe pain, chills, fever or difficulty passing urine (very rare) or other problems, you should call us  at [phone] or report to the nearest emergency room.  Do not stay seated continuously for more than 2-3 hours for a day or two after the procedure.   Tighten your buttock muscles 10-15 times every two hours and take 10-15 deep breaths every 1-2 hours.  Do not spend more than a few minutes on the toilet if you cannot empty your bowel; instead re-visit the toilet at a later time.  Follow-up as needed.   Thank you for choosing me and [facility].  Lupita CHARLENA Commander, M.D., [facility]

## 2024-10-04 DIAGNOSIS — M1711 Unilateral primary osteoarthritis, right knee: Secondary | ICD-10-CM | POA: Diagnosis not present

## 2024-10-16 DIAGNOSIS — J302 Other seasonal allergic rhinitis: Secondary | ICD-10-CM | POA: Diagnosis not present

## 2024-10-16 DIAGNOSIS — J3089 Other allergic rhinitis: Secondary | ICD-10-CM | POA: Diagnosis not present

## 2024-10-18 ENCOUNTER — Ambulatory Visit: Admitting: Student

## 2024-10-22 NOTE — Progress Notes (Unsigned)
  Electrophysiology Office Note:   Date:  10/23/2024  ID:  ANTONINETTE LERNER, DOB Apr 08, 1961, MRN [id number]  Primary Cardiologist: Danelle Birmingham, MD Primary Heart Failure: None Electrophysiologist: None      History of Present Illness:   Tammy Randolph is a 64 y.o. female with h/o HTN, anxiety, bipolar disorder and ADD seen today for routine electrophysiology followup.   Since last being seen in our clinic the patient reports she has tremendous amount of stress in her life. She indicates she is caring for her mother, her brother was murdered and another sibling had a heart attack.  She is concerned about her own cardiac health.  She indicates she has had a pain under her left breast that is described as a squeezing sensation for the last year.  Sometimes if fleeting and sometimes can last for up to an hour. She denies other associated symptoms - sweating, nausea, shortness of breath, dizziness, pre-syncope/syncope and no sustained palpitations.   She denies chest pain, palpitations, dyspnea, PND, orthopnea, nausea, vomiting, dizziness, syncope, edema, weight gain, or early satiety.   Review of systems complete and found to be negative unless listed in HPI.   EP Information / Studies Reviewed:    EKG is ordered today. Personal review as below.  EKG Interpretation Date/Time:  Wednesday October 23 2024 08:29:38 EDT Ventricular Rate:  84 PR Interval:  154 QRS Duration:  76 QT Interval:  372 QTC Calculation: 439 R Axis:   43  Text Interpretation: Normal sinus rhythm Normal ECG Confirmed by Aniceto Jarvis ([id number]) on 10/23/2024 8:37:29 AM   Risk Assessment/Calculations:              Physical Exam:   VS:  BP 100/70 (BP Location: Left Arm, Patient Position: Sitting, Cuff Size: Normal)   Pulse 83   Ht 5' 3 (1.6 m)   Wt 137 lb (62.1 kg)   SpO2 98%   BMI 24.27 kg/m    Wt Readings from Last 3 Encounters:  10/23/24 137 lb (62.1 kg)  10/02/24 138 lb 8 oz (62.8 kg)  08/21/24 141 lb (64  kg)     GEN: Well nourished, well developed in no acute distress NECK: No JVD; No carotid bruits CARDIAC: Regular rate and rhythm, no murmurs, rubs, gallops RESPIRATORY:  Clear to auscultation without rales, wheezing or rhonchi  ABDOMEN: Soft, non-tender, non-distended EXTREMITIES:  No edema; No deformity   ASSESSMENT AND PLAN:    Hx Palpitations  -prior cardiac monitor in 06/2020 without sustained arrhythmia, rare ectopy -no symptom burden    Hypertension  LVEF 55-60% 12/2020  -well controlled on current regimen    -Coreg  12.5 mg BID -Spironolactone  25mg  daily   Chest Discomfort / Squeezing  -prolonged duration of symptoms (for at least one year), less likely cardiac in origin but will order coronary CT to review  -pre-CT labs > BMP, CBC    Follow up  > given no EP specific concerns / medications, will plan to transition to primary Cardiology to establish care    Signed, Jarvis Aniceto, NP-C, AGACNP-BC [facility] - Electrophysiology  10/23/2024, 8:59 AM

## 2024-10-23 ENCOUNTER — Encounter: Payer: Self-pay | Admitting: Pulmonary Disease

## 2024-10-23 ENCOUNTER — Ambulatory Visit: Attending: Pulmonary Disease | Admitting: Pulmonary Disease

## 2024-10-23 VITALS — BP 100/70 | HR 83 | Ht 63.0 in | Wt 137.0 lb

## 2024-10-23 DIAGNOSIS — R002 Palpitations: Secondary | ICD-10-CM

## 2024-10-23 DIAGNOSIS — R072 Precordial pain: Secondary | ICD-10-CM | POA: Diagnosis not present

## 2024-10-23 DIAGNOSIS — I10 Essential (primary) hypertension: Secondary | ICD-10-CM

## 2024-10-23 MED ORDER — METOPROLOL TARTRATE 100 MG PO TABS: 100.0000 mg | ORAL_TABLET | Freq: Once | ORAL | 0 refills | Status: DC

## 2024-10-23 MED ORDER — METOPROLOL TARTRATE 50 MG PO TABS: 50.0000 mg | ORAL_TABLET | Freq: Once | ORAL | 0 refills | Status: AC

## 2024-10-23 NOTE — Patient Instructions (Signed)
 Medication Instructions:  Your physician recommends that you continue on your current medications as directed. Please refer to the Current Medication list given to you today.  *If you need a refill on your cardiac medications before your next appointment, please call your pharmacy*  Lab Work: BMET, CBC-TODAY If you have labs (blood work) drawn today and your tests are completely normal, you will receive your results only by: MyChart Message (if you have MyChart) OR A paper copy in the mail If you have any lab test that is abnormal or we need to change your treatment, we will call you to review the results.  Testing/Procedures:   Your cardiac CT will be scheduled at one of the below locations:   [facility] [address] [place], [place] [address] [phone]  OR   [facility] [address]  [place], [place] [address]   If scheduled at the [facility] at [address], please enter the parking lot using the [address] entrance and use the FREE valet service at the patient drop-off area. Enter the building and check-in with registration on the main floor.   If scheduled at [facility], please arrive 30 minutes early for check-in and test prep.  Please follow these instructions carefully (unless otherwise directed):  An IV will be required for this test and Nitroglycerin will be given.   On the Night Before the Test: Be sure to Drink plenty of water. Do not consume any caffeinated/decaffeinated beverages or chocolate 12 hours prior to your test. Do not take any antihistamines 12 hours prior to your test.  If the patient has contrast allergy: Patient will need a prescription for Prednisone and very clear instructions (as follows): Prednisone 50 mg - take 13 hours prior to test Take another Prednisone 50 mg 7 hours prior to test Take another Prednisone 50 mg 1 hour prior to test Take Benadryl 50 mg 1  hour prior to test Patient must complete all four doses of above prophylactic medications. Patient will need a ride after test due to Benadryl.  On the Day of the Test: Drink plenty of water until 1 hour prior to the test. Do not eat any food 1 hour prior to test. You may take your regular medications prior to the test.  Take metoprolol (Lopressor) two hours prior to test. If you take Furosemide/Hydrochlorothiazide/Spironolactone /Chlorthalidone, please HOLD on the morning of the test. Patients who wear a continuous glucose monitor MUST remove the device prior to scanning. FEMALES- please wear underwire-free bra if available, avoid dresses & tight clothing       After the Test: Drink plenty of water. After receiving IV contrast, you may experience a mild flushed feeling. This is normal. On occasion, you may experience a mild rash up to 24 hours after the test. This is not dangerous. If this occurs, you can take Benadryl 25 mg, Zyrtec, Claritin, or Allegra and increase your fluid intake. (Patients taking Tikosyn should avoid Benadryl, and may take Zyrtec, Claritin, or Allegra) If you experience trouble breathing, this can be serious. If it is severe call 911 IMMEDIATELY. If it is mild, please call our office.  We will call to schedule your test 2-4 weeks out understanding that some insurance companies will need an authorization prior to the service being performed.   For more information and frequently asked questions, please visit our website : [email or web address]  For non-scheduling related questions, please contact the cardiac imaging nurse  navigator should you have any questions/concerns: Cardiac Imaging Nurse Navigators Direct Office Dial: [phone]   For scheduling needs, including cancellations and rescheduling, please call Brittany, [phone].   Follow-Up: At [facility], you and your health needs are our priority.  As part of our continuing mission  to provide you with exceptional heart care, our providers are all part of one team.  This team includes your primary Cardiologist (physician) and Advanced Practice Providers or APPs (Physician Assistants and Nurse Practitioners) who all work together to provide you with the care you need, when you need it.  Your next appointment:   3-4 month(s)  Provider:   Dr Jeffrie

## 2024-10-23 NOTE — Addendum Note (Signed)
 Addended by: ANICETO DAPHNE CROME on: 10/23/2024 12:10 PM   Modules accepted: Orders

## 2024-10-24 ENCOUNTER — Ambulatory Visit: Payer: Self-pay | Admitting: Pulmonary Disease

## 2024-10-24 LAB — BASIC METABOLIC PANEL WITH GFR
BUN/Creatinine Ratio: 15 (ref 12–28)
BUN: 15 mg/dL (ref 8–27)
CO2: 25 mmol/L (ref 20–29)
Calcium: 9.3 mg/dL (ref 8.7–10.3)
Chloride: 99 mmol/L (ref 96–106)
Creatinine, Ser: 0.99 mg/dL (ref 0.57–1.00)
Glucose: 74 mg/dL (ref 70–99)
Potassium: 4.9 mmol/L (ref 3.5–5.2)
Sodium: 139 mmol/L (ref 134–144)
eGFR: 65 mL/min/1.73 (ref >59)

## 2024-10-24 LAB — CBC
Hematocrit: 42.7 % (ref 34.0–46.6)
Hemoglobin: 14.1 g/dL (ref 11.1–15.9)
MCH: 30.6 pg (ref 26.6–33.0)
MCHC: 33 g/dL (ref 31.5–35.7)
MCV: 93 fL (ref 79–97)
Platelets: 256 x10E3/uL (ref 150–450)
RBC: 4.61 x10E6/uL (ref 3.77–5.28)
RDW: 11.8 % (ref 11.7–15.4)
WBC: 8.8 x10E3/uL (ref 3.4–10.8)

## 2024-10-29 ENCOUNTER — Ambulatory Visit: Admitting: Gastroenterology

## 2024-11-01 ENCOUNTER — Encounter (HOSPITAL_COMMUNITY): Payer: Self-pay

## 2024-11-05 ENCOUNTER — Ambulatory Visit (HOSPITAL_COMMUNITY)
Admission: RE | Admit: 2024-11-05 | Discharge: 2024-11-05 | Disposition: A | Discharge status: Home / Self Care | Source: Ambulatory Visit | Attending: Cardiovascular Disease | Admitting: Cardiovascular Disease

## 2024-11-05 DIAGNOSIS — R072 Precordial pain: Secondary | ICD-10-CM | POA: Diagnosis not present

## 2024-11-05 MED ORDER — IOHEXOL 350 MG/ML SOLN
100.0000 mL | Freq: Once | INTRAVENOUS | Status: AC | PRN
  Administered 2024-11-05: 100 mL via INTRAVENOUS

## 2024-11-05 MED ORDER — NITROGLYCERIN 0.4 MG SL SUBL
0.8000 mg | SUBLINGUAL_TABLET | Freq: Once | SUBLINGUAL | Status: AC
  Administered 2024-11-05: 0.8 mg via SUBLINGUAL

## 2024-11-06 ENCOUNTER — Other Ambulatory Visit: Payer: Self-pay | Admitting: Cardiology

## 2024-11-06 ENCOUNTER — Ambulatory Visit (HOSPITAL_COMMUNITY)
Admission: RE | Admit: 2024-11-06 | Discharge: 2024-11-06 | Disposition: A | Discharge status: Home / Self Care | Source: Ambulatory Visit | Attending: Cardiology | Admitting: Cardiology

## 2024-11-06 DIAGNOSIS — R931 Abnormal findings on diagnostic imaging of heart and coronary circulation: Secondary | ICD-10-CM

## 2024-11-07 ENCOUNTER — Telehealth: Payer: Self-pay | Admitting: Pulmonary Disease

## 2024-11-07 DIAGNOSIS — R079 Chest pain, unspecified: Secondary | ICD-10-CM

## 2024-11-07 NOTE — Telephone Encounter (Signed)
 Patient called, personal identifiers confirmed.  Results of coronary CT reviewed with her and need for additional testing.  Will plan for stress PET CT to rule out ischemia in the LAD territory.

## 2024-11-25 ENCOUNTER — Encounter (HOSPITAL_COMMUNITY): Payer: Self-pay

## 2024-11-26 ENCOUNTER — Telehealth (HOSPITAL_COMMUNITY): Payer: Self-pay | Admitting: Emergency Medicine

## 2024-11-26 NOTE — Telephone Encounter (Signed)
Attempted to call patient regarding upcoming cardiac PET appointment. Left message on voicemail with name and callback number Aidan Moten RN Navigator Cardiac Imaging [facility] [phone] Office [phone] Cell  

## 2024-11-26 NOTE — Telephone Encounter (Signed)
 Nur [id number] added for PET testing

## 2024-11-26 NOTE — Addendum Note (Signed)
 Addended by: ANICETO DAPHNE CROME on: 11/26/2024 08:26 AM   Modules accepted: Orders

## 2024-11-27 ENCOUNTER — Ambulatory Visit (HOSPITAL_COMMUNITY)
Admission: RE | Admit: 2024-11-27 | Discharge: 2024-11-27 | Disposition: A | Source: Ambulatory Visit | Attending: Pulmonary Disease | Admitting: Pulmonary Disease

## 2024-11-27 DIAGNOSIS — R079 Chest pain, unspecified: Secondary | ICD-10-CM | POA: Diagnosis not present

## 2024-11-27 LAB — NM PET CT CARDIAC PERFUSION MULTI W/ABSOLUTE BLOODFLOW
LV dias vol: 63 mL (ref 46–106)
MBFR: 2.83
Nuc Rest EF: 70 %
Nuc Stress EF: 72 %
Peak HR: 105 {beats}/min
Rest HR: 79 {beats}/min
Rest MBF: 0.9 ml/g/min
Rest Nuclear Isotope Dose: 16.2 mCi
ST Depression (mm): 0 mm
Stress MBF: 2.55 ml/g/min
Stress Nuclear Isotope Dose: 16.1 mCi
TID: 0.97

## 2024-11-27 MED ORDER — RUBIDIUM RB82 GENERATOR (RUBYFILL)
16.2000 | PACK | Freq: Once | INTRAVENOUS | Status: AC
  Administered 2024-11-27: 16.2 via INTRAVENOUS

## 2024-11-27 MED ORDER — REGADENOSON 0.4 MG/5ML IV SOLN
INTRAVENOUS | Status: AC
  Filled 2024-11-27: qty 5

## 2024-11-27 MED ORDER — REGADENOSON 0.4 MG/5ML IV SOLN
0.4000 mg | Freq: Once | INTRAVENOUS | Status: AC
  Administered 2024-11-27: 0.4 mg via INTRAVENOUS

## 2024-11-27 MED ORDER — RUBIDIUM RB82 GENERATOR (RUBYFILL)
16.0600 | PACK | Freq: Once | INTRAVENOUS | Status: AC
  Administered 2024-11-27: 16.06 via INTRAVENOUS

## 2024-11-28 ENCOUNTER — Ambulatory Visit: Payer: Self-pay | Admitting: Pulmonary Disease

## 2024-11-28 DIAGNOSIS — R0609 Other forms of dyspnea: Secondary | ICD-10-CM

## 2024-12-11 ENCOUNTER — Other Ambulatory Visit (HOSPITAL_COMMUNITY)

## 2025-01-13 ENCOUNTER — Ambulatory Visit: Admitting: Internal Medicine

## 2025-01-13 ENCOUNTER — Encounter: Payer: Self-pay | Admitting: Internal Medicine

## 2025-01-13 VITALS — BP 142/74 | HR 86 | Temp 97.6°F | Ht 64.5 in | Wt 136.8 lb

## 2025-01-13 DIAGNOSIS — R0789 Other chest pain: Secondary | ICD-10-CM

## 2025-01-13 DIAGNOSIS — R490 Dysphonia: Secondary | ICD-10-CM

## 2025-01-13 DIAGNOSIS — Z87898 Personal history of other specified conditions: Secondary | ICD-10-CM

## 2025-01-13 DIAGNOSIS — Z8709 Personal history of other diseases of the respiratory system: Secondary | ICD-10-CM | POA: Diagnosis not present

## 2025-01-13 DIAGNOSIS — K219 Gastro-esophageal reflux disease without esophagitis: Secondary | ICD-10-CM

## 2025-01-13 DIAGNOSIS — Z87891 Personal history of nicotine dependence: Secondary | ICD-10-CM

## 2025-01-13 DIAGNOSIS — Z8719 Personal history of other diseases of the digestive system: Secondary | ICD-10-CM

## 2025-01-13 DIAGNOSIS — R053 Chronic cough: Secondary | ICD-10-CM

## 2025-01-13 DIAGNOSIS — J310 Chronic rhinitis: Secondary | ICD-10-CM

## 2025-01-13 DIAGNOSIS — R0609 Other forms of dyspnea: Secondary | ICD-10-CM

## 2025-01-13 DIAGNOSIS — Z85828 Personal history of other malignant neoplasm of skin: Secondary | ICD-10-CM

## 2025-01-13 DIAGNOSIS — Z8269 Family history of other diseases of the musculoskeletal system and connective tissue: Secondary | ICD-10-CM

## 2025-01-13 LAB — NITRIC OXIDE: Nitric Oxide: 6

## 2025-01-13 NOTE — Patient Instructions (Addendum)
"    ICD-10-CM   1. History of asthma  Z87.09 Nitric oxide     2. History of wheezing  Z87.898 Nitric oxide     3. Chronic cough  R05.3 Nitric oxide     4. DOE (dyspnea on exertion)  R06.09 Nitric oxide     5. Atypical chest pain  R07.89 Nitric oxide     6. Raspy voice  R49.0 Nitric oxide     7. History of gastroesophageal reflux (GERD)  Z87.19 Nitric oxide     8. History of basal cell carcinoma (BCC) of skin  Z85.828 Nitric oxide       History of asthma History of wheezing Chronic cough DOE (dyspnea on exertion)  -Exhaled nitric oxide  test is normal here in the office but we do need to do further workup to figure out if symptoms are coming from asthma.  Cardiac CT chest lung fields are reported as normal but at the base of the lungs it could just be squished up appearance versus some scar; I am not sure at this point  Plan - Do full pulmonary function test - Do CBC with differential and RAST allergy panel with IgE - Consider high-resolution CT chest supine and prone if all this workup is normal  - Holding off on getting this right away because of the fact you have had 2 recent CT scans of the chest and radiation exposure  Atypical chest pain Family history of SLE  Plan - ANA, double-stranded DNA, rheumatoid factor, CCP, SSA, SSB, SCL-70   Raspy voice History of gastroesophageal reflux (GERD)  Plan  - Continue PPI medication - Monitor with the help of ENT   History of basal cell carcinoma (BCC) of skin  Plan  - refer [facility]: have also emailed Dr Amy Jordan  Chronic Rhinitis  Plan  -0 discuss astelin next visit  Follow-up - 6-8 weeks but after completing all of the tests "

## 2025-01-13 NOTE — Progress Notes (Signed)
 "      OV 01/13/2025  Subjective:  Patient ID: Tammy Randolph, female , DOB: 1961-01-16 , age 64 y.o. , MRN: [id number] , ADDRESS: [address] [place] [address] PCP Cyndi Shaver, PA-C (Inactive) Patient Care Team: Cyndi Shaver, PA-C (Inactive) as PCP - General (Physician Assistant) Waddell Danelle ORN, MD as PCP - Cardiology (Cardiology) Livingston Rigg, MD as Consulting Physician (Dermatology)  This Provider for this visit: Treatment Team:  Attending Provider: Geronimo Amel, MD    01/13/2025 -   Chief Complaint  Patient presents with   Consult    Pt states she has been having sharp pains running under breast to back on left side, pt stated [place] [facility] stated she has fluids in both lungs SOB occurs w/ any activity Dry cough occurs     HPI Tammy Randolph 64 y.o. -Tammy Randolph is a 64 year old female with asthma who presents with chest pain and shortness of breath. She was referred by Connell Barrack from cardiology due to  fluid observed in the lungs on a PET scan.  Background history she has a diagnosis of basal cell carcinoma.  Followed by Dr. Livingston.  She needs a new dermatologist and she has been for referral.  She has history of hypertension colonic polyps she has extensive low back knee and neck surgeries.  She has a history of childhood asthma.  She has history of migraines.  She herself does not have any connective tissue disease diagnosis.  No diagnosed of COPD does not smoke.  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  She has multiple complaints  -Longstanding history of chronic rhinitis with acute recurrent pansinusitis.  She gave her history is been going on for 40 years.  Most recently seen allergist Dr. Kristi at [facility].  External record review shows last at visit 10/16/2024.  Skin allergy testing at that time was negative.  He recommended Xyzal and Nasacort.  He recommended getting a CT scan during an episode of  acute sinusitis and coming back in 1 year.  - She also experiences wheezing, which has been ongoing for several years and was previously attributed to allergies. She has a history of asthma since childhood and reports increased use of her inhaler recently  - She also has a history of dyspnea on exertion for the last 2 or 3 years.  In 20 25H been particularly worse.  She notices occasionally while walking up the hills around the house and timing stairs but it has been most pronounced when she noticed that she went hiking from 6000 feet elevation to 9000 feet elevation and several terrain's across the wall including summer 2025 and [place], August 2025 in [place] , October 2025 in [place] and then back again in [place] .  She had to stop 6 times more than normal.  Pulse ox was never checked at any of these hikes.  She also feels that at times she cannot get her deep breath.  She experiences chest pain and shortness of breath, initially suspecting heart trouble due to pain radiating through her chest, down her arm, and through her back. The chest pain has been present for about six to eight months, occurring randomly and described as sharp and shooting, lasting a few minutes. The pain radiates through her breast, back, and up her neck.   She was diagnosed with acid reflux about six months ago, which was associated with a dry cough.  Per external record review EGD performed  in August 2025 did show evidence of acid reflux and esophageal strictures she has a history of high blood pressure, colon polyps, and multiple surgeries including back, knee, shoulder, and neck surgeries. No smoking history is reported, and she has a high pain tolerance.   - Most recently on 10/23/2024 she saw Daphne Beat nurse practitioner in electrophysiology.  She reported significant social stress such as her brother being murdered and another sibling had a heart attack.  She first had a cardiac CT on 11/06/2024.  There was no  flow-limiting abnormalities.  The lung fields were reported as clear upon my personal visualization of those she might have interstitial lung abnormalities [early ILD] but I am not sure then on 11/27/2024 she had a nuclear medicine PET scan for which she has been referred.  Again all she could have is basilar atelectasis versus ILA.  -Blood work does show increased eosinophils although exhaled nitric oxide  today is normal  ADDENDUM REPORT: 11/10/2024 13:45   CLINICAL DATA:  This over-read does not include interpretation of cardiac or coronary anatomy or pathology. The coronary CTA interpretation by the cardiologist is attached.   COMPARISON:  None available.   FINDINGS: No suspicious nodules, masses, or infiltrates are identified in the visualized portion of the lungs. No pleural fluid seen.   The visualized portions of the mediastinum and hilar regions are unremarkable.   IMPRESSION: No significant non-cardiac abnormality identified.     Electronically Signed   By: Norleen DELENA Kil M.D.   On: 11/10/2024 13:45      PFT      No data to display           Latest Reference Range & Units 09/11/07 14:16 07/22/08 00:00 06/07/10 14:13 01/12/11 15:02 02/02/11 14:48 09/06/11 08:59 08/06/12 10:59 12/23/14 10:00 05/23/17 15:02 09/06/20 18:58 02/20/24 10:19  Eosinophils Absolute 0.0 - 0.7 K/uL 0.1 0.1 0.1 0.2 0.2 0.1 0.2 0.2 0.2 0.4 0.2     LAB RESULTS last 96 hours No results found.    Current Medications[1]    has a past medical history of Adenomatous colon polyp (1996), Anxiety, ANXIETY (07/15/2008), Arthritis, Asthma, Bipolar disorder (HCC), Chronic fatigue, Depression, Family history of breast cancer, Family history of colon cancer, Family history of pancreatic cancer, GERD (gastroesophageal reflux disease), History of blood transfusion (2003), Hypertension, Personal history of adenomatous and serrated colon polyps, Pneumonia, PONV (postoperative nausea and vomiting), Seasonal  allergies, and Spinal headache.   reports that she quit smoking about 38 years ago. Her smoking use included cigarettes. She has never used smokeless tobacco.  Past Surgical History:  Procedure Laterality Date   ABDOMINAL HYSTERECTOMY  2004   and USO for endometriosis   ANTERIOR CERVICAL DECOMP/DISCECTOMY FUSION N/A 09/23/2015   Procedure: ANTERIOR CERVICAL DISCECTOMY FUSION C4-7     (3 LEVELS);  Surgeon: Donaciano Sprang, MD;  Location: [facility] OR;  Service: Orthopedics;  Laterality: N/A;   APPENDECTOMY  1988   BREAST ENHANCEMENT SURGERY Bilateral 2008   COLONOSCOPY W/ POLYPECTOMY  2013   Dr Aneita   HEMORRHOID BANDING     HYSTERECTOMY ABDOMINAL WITH SALPINGECTOMY     x4 for endometriosis   KNEE ARTHROSCOPY WITH ANTERIOR CRUCIATE LIGAMENT (ACL) REPAIR Right 01/26/2013   LUMBAR LAMINECTOMY  2008   TONSILLECTOMY  2009    Allergies[2]  Immunization History  Administered Date(s) Administered   Influenza Whole 02/02/2009   Influenza-Unspecified 08/26/2014   Td 12/26/1998   Tdap 08/06/2012, 12/27/2018    Family History  Problem Relation Age of  Onset   CVA Mother        x3   Hypertension Mother    Arthritis Mother    Breast cancer Mother        dx. 50s   Heart Problems Mother        pacemaker   Colon polyps Mother    Sleep apnea Father    CVA Father    Sleep apnea Sister    Hypertension Sister    Hypertension Sister    Colon polyps Sister        needed surgery to remove a large one   Colon polyps Brother    Clotting disorder Brother    Colon polyps Brother    Leukemia Maternal Uncle 9       CLL   Heart attack Maternal Uncle    Leukemia Maternal Uncle        CLL   Coronary artery disease Paternal Aunt    Stroke Paternal Uncle    Pancreatic cancer Paternal Uncle    Colon cancer Maternal Grandmother 71   Breast cancer Paternal Grandmother        dx. 50s   Lung cancer Paternal Grandfather     Current Medications[3]      Objective:   Vitals:   01/13/25 1514   BP: (!) 142/74  Pulse: 86  Temp: 97.6 F (36.4 C)  TempSrc: Oral  SpO2: 98%  Weight: 136 lb 12.8 oz (62.1 kg)  Height: 5' 4.5 (1.638 m)    Estimated body mass index is 23.12 kg/m as calculated from the following:   Height as of this encounter: 5' 4.5 (1.638 m).   Weight as of this encounter: 136 lb 12.8 oz (62.1 kg).  @WEIGHTCHANGE @  [employer]   01/13/25 1514  Weight: 136 lb 12.8 oz (62.1 kg)     Physical Exam   General: No distress. Looks well O2 at rest: no Cane present: no Sitting in wheel chair: no Frail: no Obese: no Neuro: Alert and Oriented x 3. GCS 15. Speech normal Psych: Pleasant Resp:  Barrel Chest - no.  Wheeze - no, Crackles - no, No overt respiratory distress CVS: Normal heart sounds. Murmurs - no Ext: Stigmata of Connective Tissue Disease - no HEENT: Normal upper airway. PEERL +. No post nasal drip        Assessment/     Assessment & Plan History of asthma  History of wheezing  Chronic cough  DOE (dyspnea on exertion)  Atypical chest pain  Family history of connective tissue disease  Raspy voice  History of gastroesophageal reflux (GERD)  History of basal cell carcinoma (BCC) of skin  Chronic rhinitis  She has multitude of thoracic related complaints including shortness of breath on exertion along with wheezing and chronic rhinitis.  Allergy testing was negative outside in the fall 2025 but it was a skin test.  First will figure out if this is related to allergic asthma or any obstructive pathophysiology.  Therefore get full pulmonary function test blood allergy test.  In addition with atypical chest pains and various complaints and with her daughter having lupus we will make sure there is no autoimmune disease.  If all this is normal then we should consider getting a high-resolution CT chest supine and prone to see if there is any evidence of ILA/ILD or not.  And again if all this is again normal then the next up would be  pulmonary stress test.  Will also consider addressing her chronic rhinitis by  recommending Astelin nasal spray at follow-up  In terms of basal cell carcinoma I did write to Dr. Amy Jordan at [facility] dermatology to see if she could take her on as a patient or anyone in that practice.  The wait list that is 9 months.  PLAN Patient Instructions     ICD-10-CM   1. History of asthma  Z87.09 Nitric oxide     2. History of wheezing  Z87.898 Nitric oxide     3. Chronic cough  R05.3 Nitric oxide     4. DOE (dyspnea on exertion)  R06.09 Nitric oxide     5. Atypical chest pain  R07.89 Nitric oxide     6. Raspy voice  R49.0 Nitric oxide     7. History of gastroesophageal reflux (GERD)  Z87.19 Nitric oxide     8. History of basal cell carcinoma (BCC) of skin  Z85.828 Nitric oxide       History of asthma History of wheezing Chronic cough DOE (dyspnea on exertion)  -Exhaled nitric oxide  test is normal here in the office but we do need to do further workup to figure out if symptoms are coming from asthma.  Cardiac CT chest lung fields are reported as normal but at the base of the lungs it could just be squished up appearance versus some scar; I am not sure at this point  Plan - Do full pulmonary function test - Do CBC with differential and RAST allergy panel with IgE - Consider high-resolution CT chest supine and prone if all this workup is normal  - Holding off on getting this right away because of the fact you have had 2 recent CT scans of the chest and radiation exposure  Atypical chest pain Family history of SLE  Plan - ANA, double-stranded DNA, rheumatoid factor, CCP, SSA, SSB, SCL-70   Raspy voice History of gastroesophageal reflux (GERD)  Plan  - Continue PPI medication - Monitor with the help of ENT   History of basal cell carcinoma (BCC) of skin  Plan  - refer [facility]: have also emailed Dr Amy Jordan  Chronic Rhinitis  Plan  -0 discuss astelin next  visit  Follow-up - 6-8 weeks but after completing all of the tests    FOLLOWUP    Return in about 7 weeks (around 03/03/2025) for 15 min visit, with Dr Geronimo, Face to Face Visit.  ( Level 05 visit E&M 2024: ew >= 60 min visit type: on-site physical face to visit  in total care time and counseling or/and coordination of care by this undersigned MD - Dr Dorethia Geronimo. This includes one or more of the following on this same day 01/13/2025: pre-charting, chart review, note writing, documentation discussion of test results, diagnostic or treatment recommendations, prognosis, risks and benefits of management options, instructions, education, compliance or risk-factor reduction. It excludes time spent by the CMA or office staff in the care of the patient. Actual time 70 min)   SIGNATURE    Dr. Dorethia Geronimo, M.D., F.C.C.P,  Pulmonary and Critical Care Medicine Staff Physician, [facility] Center Director - Interstitial Lung Disease  Program  Pulmonary Fibrosis [facility] at [facility] [place], [place], [address]  Pager: [phone], If no answer or between  15:00h - 7:00h: call [phone] Telephone: [phone]  5:30 PM 01/13/2025     [1]  Current Outpatient Medications:    albuterol  (PROVENTIL  HFA;VENTOLIN  HFA) 108 (90 BASE) MCG/ACT inhaler, Inhale 1-2 puffs into the lungs every 6 (  six) hours as needed for wheezing or shortness of breath., Disp: , Rfl:    ALPRAZolam  (XANAX ) 1 MG tablet, TAKE 1 TABLET BY MOUTH FOUR TIMES DAILY, Disp: 120 tablet, Rfl: 2   amphetamine -dextroamphetamine  (ADDERALL XR) 30 MG 24 hr capsule, Take 1 capsule (30 mg total) by mouth daily., Disp: 30 capsule, Rfl: 0   amphetamine -dextroamphetamine  (ADDERALL) 10 MG tablet, Take one tablet by mouth daily., Disp: 30 tablet, Rfl: 0   Biotin 1 MG CAPS, Take by mouth., Disp: , Rfl:    buPROPion  (WELLBUTRIN  XL) 150 MG 24 hr tablet, Take 3 tablets (450 mg total) by mouth every  morning., Disp: 90 tablet, Rfl: 5   carvedilol  (COREG ) 12.5 MG tablet, Take 1 tablet (12.5 mg total) by mouth 2 (two) times daily with a meal., Disp: 180 tablet, Rfl: 0   co-enzyme Q-10 30 MG capsule, Take 30 mg by mouth 3 (three) times daily., Disp: , Rfl:    estradiol  (VIVELLE -DOT) 0.05 MG/24HR patch, 1 patch 2 (two) times a week., Disp: , Rfl:    lamoTRIgine  (LAMICTAL ) 150 MG tablet, Take 1 tablet (150 mg total) by mouth daily., Disp: 30 tablet, Rfl: 5   magnesium (MAGTAB) 84 MG ( ) TBCR SR tablet, Take 84 mg by mouth., Disp: , Rfl:    Misc. Devices MISC, Apply 1 gram to the vestibule daily (Estradiol  0.01% with Testosterone  0.1%), Disp: , Rfl:    Multiple Vitamin (MULTIVITAMIN) LIQD, Take 5 mLs by mouth daily., Disp: , Rfl:    MYRBETRIQ 50 MG TB24 tablet, Take 50 mg by mouth daily., Disp: , Rfl:    ondansetron  (ZOFRAN ) 4 MG tablet, Take 1 tablet (4 mg total) by mouth every 8 (eight) hours as needed for nausea or vomiting., Disp: 20 tablet, Rfl: 0   pantoprazole  (PROTONIX ) 40 MG tablet, TAKE 1 TABLET(40 MG) BY MOUTH DAILY BEFORE BREAKFAST, Disp: 90 tablet, Rfl: 0   predniSONE (DELTASONE) 10 MG tablet, Take by mouth as needed., Disp: , Rfl:    progesterone  (PROMETRIUM ) 100 MG capsule, Take 100 mg by mouth daily., Disp: , Rfl:    pseudoephedrine-acetaminophen  (TYLENOL  SINUS) 30-500 MG TABS, Take 1 tablet by mouth every 4 (four) hours as needed (Seasonal Allergies)., Disp: , Rfl:    RABEprazole (ACIPHEX) 20 MG tablet, Take 20 mg by mouth daily., Disp: , Rfl:    spironolactone  (ALDACTONE ) 25 MG tablet, TAKE 1 TABLET(25 MG) BY MOUTH DAILY, Disp: 90 tablet, Rfl: 0   traZODone  (DESYREL ) 100 MG tablet, TAKE 1 TABLET(100 MG) BY MOUTH AT BEDTIME, Disp: 30 tablet, Rfl: 5   Ubrogepant  (UBRELVY ) 50 MG TABS, Take 1 tablet (50 mg total) by mouth as needed (headache)., Disp: 9 tablet, Rfl: 1   zolpidem  (AMBIEN ) 10 MG tablet, Take one tablet by mouth every night at bedtime (Patient taking differently: Take  one tablet by mouth every night at bedtime, as needed), Disp: 30 tablet, Rfl: 2   metoprolol  tartrate (LOPRESSOR ) 50 MG tablet, Take 1 tablet (50 mg total) by mouth once for 1 dose. Take 2 hours prior to CT (Patient not taking: Reported on 01/13/2025), Disp: 1 tablet, Rfl: 0   OVER THE COUNTER MEDICATION, , Disp: , Rfl:  [2]  Allergies Allergen Reactions   Morphine Other (See Comments)    Pt did not like the way it made her feel  [3]  Current Outpatient Medications:    albuterol  (PROVENTIL  HFA;VENTOLIN  HFA) 108 (90 BASE) MCG/ACT inhaler, Inhale 1-2 puffs into the lungs every 6 (six) hours as needed  for wheezing or shortness of breath., Disp: , Rfl:    ALPRAZolam  (XANAX ) 1 MG tablet, TAKE 1 TABLET BY MOUTH FOUR TIMES DAILY, Disp: 120 tablet, Rfl: 2   amphetamine -dextroamphetamine  (ADDERALL XR) 30 MG 24 hr capsule, Take 1 capsule (30 mg total) by mouth daily., Disp: 30 capsule, Rfl: 0   amphetamine -dextroamphetamine  (ADDERALL) 10 MG tablet, Take one tablet by mouth daily., Disp: 30 tablet, Rfl: 0   Biotin 1 MG CAPS, Take by mouth., Disp: , Rfl:    buPROPion  (WELLBUTRIN  XL) 150 MG 24 hr tablet, Take 3 tablets (450 mg total) by mouth every morning., Disp: 90 tablet, Rfl: 5   carvedilol  (COREG ) 12.5 MG tablet, Take 1 tablet (12.5 mg total) by mouth 2 (two) times daily with a meal., Disp: 180 tablet, Rfl: 0   co-enzyme Q-10 30 MG capsule, Take 30 mg by mouth 3 (three) times daily., Disp: , Rfl:    estradiol  (VIVELLE -DOT) 0.05 MG/24HR patch, 1 patch 2 (two) times a week., Disp: , Rfl:    lamoTRIgine  (LAMICTAL ) 150 MG tablet, Take 1 tablet (150 mg total) by mouth daily., Disp: 30 tablet, Rfl: 5   magnesium (MAGTAB) 84 MG ( ) TBCR SR tablet, Take 84 mg by mouth., Disp: , Rfl:    Misc. Devices MISC, Apply 1 gram to the vestibule daily (Estradiol  0.01% with Testosterone  0.1%), Disp: , Rfl:    Multiple Vitamin (MULTIVITAMIN) LIQD, Take 5 mLs by mouth daily., Disp: , Rfl:    MYRBETRIQ 50 MG TB24 tablet,  Take 50 mg by mouth daily., Disp: , Rfl:    ondansetron  (ZOFRAN ) 4 MG tablet, Take 1 tablet (4 mg total) by mouth every 8 (eight) hours as needed for nausea or vomiting., Disp: 20 tablet, Rfl: 0   pantoprazole  (PROTONIX ) 40 MG tablet, TAKE 1 TABLET(40 MG) BY MOUTH DAILY BEFORE BREAKFAST, Disp: 90 tablet, Rfl: 0   predniSONE (DELTASONE) 10 MG tablet, Take by mouth as needed., Disp: , Rfl:    progesterone  (PROMETRIUM ) 100 MG capsule, Take 100 mg by mouth daily., Disp: , Rfl:    pseudoephedrine-acetaminophen  (TYLENOL  SINUS) 30-500 MG TABS, Take 1 tablet by mouth every 4 (four) hours as needed (Seasonal Allergies)., Disp: , Rfl:    RABEprazole (ACIPHEX) 20 MG tablet, Take 20 mg by mouth daily., Disp: , Rfl:    spironolactone  (ALDACTONE ) 25 MG tablet, TAKE 1 TABLET(25 MG) BY MOUTH DAILY, Disp: 90 tablet, Rfl: 0   traZODone  (DESYREL ) 100 MG tablet, TAKE 1 TABLET(100 MG) BY MOUTH AT BEDTIME, Disp: 30 tablet, Rfl: 5   Ubrogepant  (UBRELVY ) 50 MG TABS, Take 1 tablet (50 mg total) by mouth as needed (headache)., Disp: 9 tablet, Rfl: 1   zolpidem  (AMBIEN ) 10 MG tablet, Take one tablet by mouth every night at bedtime (Patient taking differently: Take one tablet by mouth every night at bedtime, as needed), Disp: 30 tablet, Rfl: 2   metoprolol  tartrate (LOPRESSOR ) 50 MG tablet, Take 1 tablet (50 mg total) by mouth once for 1 dose. Take 2 hours prior to CT (Patient not taking: Reported on 01/13/2025), Disp: 1 tablet, Rfl: 0   OVER THE COUNTER MEDICATION, , Disp: , Rfl:   "

## 2025-01-14 ENCOUNTER — Other Ambulatory Visit: Payer: Self-pay | Admitting: Internal Medicine

## 2025-01-14 LAB — CBC WITH DIFFERENTIAL/PLATELET
Basophils Absolute: 0 K/uL (ref 0.0–0.1)
Basophils Relative: 0.4 % (ref 0.0–3.0)
Eosinophils Absolute: 0.1 K/uL (ref 0.0–0.7)
Eosinophils Relative: 1 % (ref 0.0–5.0)
HCT: 42.6 % (ref 36.0–46.0)
Hemoglobin: 14.3 g/dL (ref 12.0–15.0)
Lymphocytes Relative: 12.7 % (ref 12.0–46.0)
Lymphs Abs: 1.3 K/uL (ref 0.7–4.0)
MCHC: 33.6 g/dL (ref 30.0–36.0)
MCV: 91 fl (ref 78.0–100.0)
Monocytes Absolute: 0.7 K/uL (ref 0.1–1.0)
Monocytes Relative: 7.2 % (ref 3.0–12.0)
Neutro Abs: 8 K/uL — ABNORMAL HIGH (ref 1.4–7.7)
Neutrophils Relative %: 78.7 % — ABNORMAL HIGH (ref 43.0–77.0)
Platelets: 284 K/uL (ref 150.0–400.0)
RBC: 4.68 Mil/uL (ref 3.87–5.11)
RDW: 13.2 % (ref 11.5–15.5)
WBC: 10.2 K/uL (ref 4.0–10.5)

## 2025-01-14 LAB — CK: Total CK: 30 U/L (ref 17–177)

## 2025-01-15 LAB — IGE: IgE (Immunoglobulin E), Serum: 4 kU/L

## 2025-01-15 LAB — ANTI-NUCLEAR AB-TITER (ANA TITER): ANA Titer 1: 1:40 {titer} — ABNORMAL HIGH

## 2025-01-15 LAB — CYCLIC CITRUL PEPTIDE ANTIBODY, IGG: Cyclic Citrullin Peptide Ab: <16 U

## 2025-01-15 LAB — ANA: Anti Nuclear Antibody (ANA): POSITIVE — AB

## 2025-01-15 LAB — SJOGRENS SYNDROME-B EXTRACTABLE NUCLEAR ANTIBODY: SSB (La) (ENA) Antibody, IgG: <1 AI

## 2025-01-15 LAB — ANTI-DNA ANTIBODY, DOUBLE-STRANDED: ds DNA Ab: 1 [IU]/mL

## 2025-01-15 LAB — ANTI-SCLERODERMA ANTIBODY: Scleroderma (Scl-70) (ENA) Antibody, IgG: <1 AI

## 2025-01-15 LAB — SJOGRENS SYNDROME-A EXTRACTABLE NUCLEAR ANTIBODY: SSA (Ro) (ENA) Antibody, IgG: <1 AI

## 2025-01-16 ENCOUNTER — Telehealth: Payer: Self-pay | Admitting: Internal Medicine

## 2025-01-16 DIAGNOSIS — R0600 Dyspnea, unspecified: Secondary | ICD-10-CM

## 2025-01-16 LAB — ALLERGEN PROFILE, PERENNIAL ALLERGEN IGE

## 2025-01-16 NOTE — Telephone Encounter (Signed)
" ° °  Tammy Randolph 01/16/2025 6:13 PM   1) Derm referral - gave her the instructions from DR Jordan below  2) Allergy test nrmal  3) [employer]: ANA 1:40- probably NCS  Informed her     SIGNATURE    Dr. Dorethia Cave, M.D., F.C.C.P,  Pulmonary and Critical Care Medicine Staff Physician, [facility] Center Director - Interstitial Lung Disease  Program  Pulmonary Fibrosis [facility] at [facility] [place], [place], [address]   Pager: [phone], If no answer  -> Check AMION or Try [phone] Telephone (clinical office): [phone] Telephone (research): [phone]  6:17 PM 01/16/2025   xxxxxxx Hi! Have her call the office and push the option for [facility], our new patient coordinator if she is a new patient. I am happy to get her in If she has been seen in our office before, we have appointment slots for spots every day so she should not have to wait long. Just have her call the office and explain her situation. We are happy to see her! Hope that helps!   Amy Young Jordan, MD, MBA "

## 2025-02-04 ENCOUNTER — Ambulatory Visit: Admitting: Cardiology

## 2025-03-04 ENCOUNTER — Ambulatory Visit: Admitting: Internal Medicine
